# Patient Record
Sex: Female | Born: 1945 | Race: White | Hispanic: No | Smoking: Never smoker
Health system: Southern US, Community
[De-identification: ages and names within clinical notes are randomized; demographics above are authoritative.]

## PROBLEM LIST (undated history)

## (undated) DIAGNOSIS — T7840XA Allergy, unspecified, initial encounter: Secondary | ICD-10-CM

## (undated) DIAGNOSIS — Z3400 Encounter for supervision of normal first pregnancy, unspecified trimester: Secondary | ICD-10-CM

## (undated) DIAGNOSIS — A809 Acute poliomyelitis, unspecified: Secondary | ICD-10-CM

## (undated) DIAGNOSIS — L719 Rosacea, unspecified: Secondary | ICD-10-CM

## (undated) DIAGNOSIS — I10 Essential (primary) hypertension: Secondary | ICD-10-CM

## (undated) DIAGNOSIS — M545 Low back pain, unspecified: Secondary | ICD-10-CM

## (undated) DIAGNOSIS — E785 Hyperlipidemia, unspecified: Secondary | ICD-10-CM

## (undated) DIAGNOSIS — M8580 Other specified disorders of bone density and structure, unspecified site: Secondary | ICD-10-CM

## (undated) DIAGNOSIS — K219 Gastro-esophageal reflux disease without esophagitis: Secondary | ICD-10-CM

## (undated) DIAGNOSIS — R51 Headache: Secondary | ICD-10-CM

## (undated) HISTORY — PX: OTHER SURGICAL HISTORY: SHX169

## (undated) HISTORY — PX: OOPHORECTOMY: SHX86

## (undated) HISTORY — DX: Essential (primary) hypertension: I10

## (undated) HISTORY — DX: Acute poliomyelitis, unspecified: A80.9

## (undated) HISTORY — DX: Headache: R51

## (undated) HISTORY — DX: Low back pain: M54.5

## (undated) HISTORY — DX: Hyperlipidemia, unspecified: E78.5

## (undated) HISTORY — DX: Gastro-esophageal reflux disease without esophagitis: K21.9

## (undated) HISTORY — PX: COLONOSCOPY: SHX174

## (undated) HISTORY — DX: Encounter for supervision of normal first pregnancy, unspecified trimester: Z34.00

## (undated) HISTORY — DX: Rosacea, unspecified: L71.9

## (undated) HISTORY — PX: CATARACT EXTRACTION: SUR2

## (undated) HISTORY — DX: Other specified disorders of bone density and structure, unspecified site: M85.80

## (undated) HISTORY — DX: Allergy, unspecified, initial encounter: T78.40XA

## (undated) HISTORY — DX: Low back pain, unspecified: M54.50

## (undated) HISTORY — PX: EYE SURGERY: SHX253

---
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
## 2001-10-18 ENCOUNTER — Encounter: Payer: Self-pay | Admitting: Emergency Medicine

## 2001-10-18 ENCOUNTER — Emergency Department (HOSPITAL_COMMUNITY): Admission: AC | Admit: 2001-10-18 | Discharge: 2001-10-18 | Payer: Self-pay

## 2002-02-01 ENCOUNTER — Encounter: Admission: RE | Admit: 2002-02-01 | Discharge: 2002-02-01 | Payer: Self-pay | Admitting: Otolaryngology

## 2002-02-01 ENCOUNTER — Encounter: Payer: Self-pay | Admitting: Otolaryngology

## 2002-05-11 ENCOUNTER — Other Ambulatory Visit: Admission: RE | Admit: 2002-05-11 | Discharge: 2002-05-11 | Payer: Self-pay | Admitting: Obstetrics and Gynecology

## 2002-06-28 ENCOUNTER — Encounter (INDEPENDENT_AMBULATORY_CARE_PROVIDER_SITE_OTHER): Payer: Self-pay

## 2002-06-28 ENCOUNTER — Ambulatory Visit (HOSPITAL_COMMUNITY): Admission: RE | Admit: 2002-06-28 | Discharge: 2002-06-28 | Payer: Self-pay | Admitting: Obstetrics and Gynecology

## 2002-06-28 ENCOUNTER — Encounter (INDEPENDENT_AMBULATORY_CARE_PROVIDER_SITE_OTHER): Payer: Self-pay | Admitting: Specialist

## 2003-02-18 LAB — HM COLONOSCOPY

## 2003-11-03 ENCOUNTER — Other Ambulatory Visit: Admission: RE | Admit: 2003-11-03 | Discharge: 2003-11-03 | Payer: Self-pay | Admitting: Obstetrics and Gynecology

## 2004-01-20 ENCOUNTER — Encounter: Payer: Self-pay | Admitting: Internal Medicine

## 2004-01-22 ENCOUNTER — Ambulatory Visit: Payer: Self-pay | Admitting: Internal Medicine

## 2004-01-30 ENCOUNTER — Ambulatory Visit: Payer: Self-pay | Admitting: Internal Medicine

## 2004-05-06 ENCOUNTER — Ambulatory Visit: Payer: Self-pay | Admitting: Internal Medicine

## 2004-08-31 ENCOUNTER — Emergency Department (HOSPITAL_COMMUNITY): Admission: EM | Admit: 2004-08-31 | Discharge: 2004-08-31 | Payer: Self-pay | Admitting: Emergency Medicine

## 2004-12-05 ENCOUNTER — Encounter: Payer: Self-pay | Admitting: Internal Medicine

## 2004-12-05 ENCOUNTER — Encounter: Admission: RE | Admit: 2004-12-05 | Discharge: 2004-12-05 | Payer: Self-pay | Admitting: Orthopedic Surgery

## 2005-01-01 ENCOUNTER — Ambulatory Visit: Payer: Self-pay | Admitting: Internal Medicine

## 2005-03-04 ENCOUNTER — Ambulatory Visit: Payer: Self-pay | Admitting: Internal Medicine

## 2005-04-23 ENCOUNTER — Ambulatory Visit: Payer: Self-pay | Admitting: Internal Medicine

## 2005-04-30 ENCOUNTER — Encounter: Payer: Self-pay | Admitting: Internal Medicine

## 2005-04-30 ENCOUNTER — Ambulatory Visit: Payer: Self-pay | Admitting: Internal Medicine

## 2005-04-30 ENCOUNTER — Other Ambulatory Visit: Admission: RE | Admit: 2005-04-30 | Discharge: 2005-04-30 | Payer: Self-pay | Admitting: Internal Medicine

## 2005-12-17 ENCOUNTER — Ambulatory Visit: Payer: Self-pay | Admitting: Internal Medicine

## 2006-01-21 ENCOUNTER — Encounter: Admission: RE | Admit: 2006-01-21 | Discharge: 2006-01-21 | Payer: Self-pay | Admitting: Internal Medicine

## 2006-05-29 ENCOUNTER — Ambulatory Visit: Payer: Self-pay | Admitting: Internal Medicine

## 2006-06-17 ENCOUNTER — Ambulatory Visit: Payer: Self-pay | Admitting: Internal Medicine

## 2006-06-17 LAB — CONVERTED CEMR LAB
Bilirubin, Direct: 0.1 mg/dL (ref 0.0–0.3)
Direct LDL: 171 mg/dL
Eosinophils Absolute: 0.2 10*3/uL (ref 0.0–0.6)
Eosinophils Relative: 4.2 % (ref 0.0–5.0)
GFR calc Af Amer: 94 mL/min
GFR calc non Af Amer: 78 mL/min
Glucose, Bld: 94 mg/dL (ref 70–99)
HCT: 44.6 % (ref 36.0–46.0)
HDL: 47.4 mg/dL (ref >39.0)
Lymphocytes Relative: 27.3 % (ref 12.0–46.0)
MCV: 84.6 fL (ref 78.0–100.0)
Monocytes Absolute: 0.4 10*3/uL (ref 0.2–0.7)
Neutro Abs: 3 10*3/uL (ref 1.4–7.7)
Neutrophils Relative %: 61.3 % (ref 43.0–77.0)
Platelets: 197 10*3/uL (ref 150–400)
Sodium: 142 meq/L (ref 135–145)
WBC: 5 10*3/uL (ref 4.5–10.5)

## 2006-06-29 ENCOUNTER — Other Ambulatory Visit: Admission: RE | Admit: 2006-06-29 | Discharge: 2006-06-29 | Payer: Self-pay | Admitting: Internal Medicine

## 2006-06-29 ENCOUNTER — Encounter: Payer: Self-pay | Admitting: Internal Medicine

## 2006-06-29 ENCOUNTER — Ambulatory Visit: Payer: Self-pay | Admitting: Internal Medicine

## 2006-07-16 ENCOUNTER — Ambulatory Visit: Payer: Self-pay | Admitting: Internal Medicine

## 2006-07-16 LAB — CONVERTED CEMR LAB
AST: 14 units/L (ref 0–37)
Cholesterol: 156 mg/dL (ref 0–200)
HDL: 43.1 mg/dL (ref >39.0)
VLDL: 17 mg/dL (ref 0–40)
Vit D, 1,25-Dihydroxy: 41 (ref 20–57)

## 2006-07-30 ENCOUNTER — Ambulatory Visit: Payer: Self-pay | Admitting: Internal Medicine

## 2006-11-27 ENCOUNTER — Ambulatory Visit: Payer: Self-pay | Admitting: Internal Medicine

## 2007-03-02 ENCOUNTER — Telehealth: Payer: Self-pay | Admitting: Internal Medicine

## 2007-04-06 ENCOUNTER — Telehealth: Payer: Self-pay | Admitting: Internal Medicine

## 2007-07-05 ENCOUNTER — Telehealth: Payer: Self-pay | Admitting: Internal Medicine

## 2007-07-08 ENCOUNTER — Encounter: Payer: Self-pay | Admitting: Internal Medicine

## 2007-08-25 ENCOUNTER — Ambulatory Visit: Payer: Self-pay | Admitting: Internal Medicine

## 2007-08-25 LAB — CONVERTED CEMR LAB
Albumin: 3.5 g/dL (ref 3.5–5.2)
BUN: 11 mg/dL (ref 6–23)
Basophils Relative: 0.1 % (ref 0.0–1.0)
Blood in Urine, dipstick: NEGATIVE
Calcium: 8.5 mg/dL (ref 8.4–10.5)
Creatinine, Ser: 0.8 mg/dL (ref 0.4–1.2)
Eosinophils Absolute: 0.5 10*3/uL (ref 0.0–0.7)
Eosinophils Relative: 12.2 % — ABNORMAL HIGH (ref 0.0–5.0)
GFR calc Af Amer: 93 mL/min
GFR calc non Af Amer: 77 mL/min
Glucose, Bld: 93 mg/dL (ref 70–99)
Glucose, Urine, Semiquant: NEGATIVE
HCT: 41.4 % (ref 36.0–46.0)
HDL: 35.9 mg/dL — ABNORMAL LOW (ref >39.0)
Hemoglobin: 14.4 g/dL (ref 12.0–15.0)
Ketones, urine, test strip: NEGATIVE
MCV: 87.1 fL (ref 78.0–100.0)
Monocytes Absolute: 0.3 10*3/uL (ref 0.1–1.0)
Monocytes Relative: 8.6 % (ref 3.0–12.0)
Neutro Abs: 1.6 10*3/uL (ref 1.4–7.7)
Platelets: 179 10*3/uL (ref 150–400)
Potassium: 3.7 meq/L (ref 3.5–5.1)
TSH: 1.98 microintl units/mL (ref 0.35–5.50)
Total Protein: 5.8 g/dL — ABNORMAL LOW (ref 6.0–8.3)
WBC Urine, dipstick: NEGATIVE
WBC: 3.7 10*3/uL — ABNORMAL LOW (ref 4.5–10.5)
pH: 7.5

## 2007-09-01 ENCOUNTER — Encounter: Payer: Self-pay | Admitting: Internal Medicine

## 2007-09-01 ENCOUNTER — Ambulatory Visit: Payer: Self-pay | Admitting: Internal Medicine

## 2007-09-01 ENCOUNTER — Other Ambulatory Visit: Admission: RE | Admit: 2007-09-01 | Discharge: 2007-09-01 | Payer: Self-pay | Admitting: Internal Medicine

## 2007-09-01 DIAGNOSIS — R519 Headache, unspecified: Secondary | ICD-10-CM | POA: Insufficient documentation

## 2007-09-01 DIAGNOSIS — M899 Disorder of bone, unspecified: Secondary | ICD-10-CM | POA: Insufficient documentation

## 2007-09-01 DIAGNOSIS — R030 Elevated blood-pressure reading, without diagnosis of hypertension: Secondary | ICD-10-CM | POA: Insufficient documentation

## 2007-09-01 DIAGNOSIS — K219 Gastro-esophageal reflux disease without esophagitis: Secondary | ICD-10-CM | POA: Insufficient documentation

## 2007-09-01 DIAGNOSIS — E785 Hyperlipidemia, unspecified: Secondary | ICD-10-CM | POA: Insufficient documentation

## 2007-09-01 DIAGNOSIS — M949 Disorder of cartilage, unspecified: Secondary | ICD-10-CM

## 2007-09-01 DIAGNOSIS — J309 Allergic rhinitis, unspecified: Secondary | ICD-10-CM

## 2007-09-01 DIAGNOSIS — R51 Headache: Secondary | ICD-10-CM

## 2007-09-01 DIAGNOSIS — M545 Low back pain: Secondary | ICD-10-CM

## 2007-10-11 ENCOUNTER — Telehealth: Payer: Self-pay | Admitting: *Deleted

## 2007-11-01 ENCOUNTER — Ambulatory Visit: Payer: Self-pay | Admitting: Internal Medicine

## 2007-11-22 ENCOUNTER — Telehealth: Payer: Self-pay | Admitting: *Deleted

## 2007-12-01 ENCOUNTER — Ambulatory Visit: Payer: Self-pay | Admitting: Internal Medicine

## 2008-04-10 ENCOUNTER — Telehealth: Payer: Self-pay | Admitting: Internal Medicine

## 2008-05-05 ENCOUNTER — Ambulatory Visit: Payer: Self-pay | Admitting: Internal Medicine

## 2008-05-10 DIAGNOSIS — Z8612 Personal history of poliomyelitis: Secondary | ICD-10-CM

## 2008-05-16 ENCOUNTER — Encounter: Payer: Self-pay | Admitting: Internal Medicine

## 2008-05-16 ENCOUNTER — Encounter: Admission: RE | Admit: 2008-05-16 | Discharge: 2008-05-16 | Payer: Self-pay | Admitting: Internal Medicine

## 2008-06-06 ENCOUNTER — Ambulatory Visit: Payer: Self-pay | Admitting: Internal Medicine

## 2008-06-06 DIAGNOSIS — I10 Essential (primary) hypertension: Secondary | ICD-10-CM

## 2008-06-07 ENCOUNTER — Telehealth: Payer: Self-pay | Admitting: Internal Medicine

## 2008-08-22 ENCOUNTER — Ambulatory Visit: Payer: Self-pay | Admitting: Family Medicine

## 2008-08-22 DIAGNOSIS — J320 Chronic maxillary sinusitis: Secondary | ICD-10-CM | POA: Insufficient documentation

## 2008-11-01 ENCOUNTER — Encounter: Payer: Self-pay | Admitting: Internal Medicine

## 2008-11-22 ENCOUNTER — Ambulatory Visit: Payer: Self-pay | Admitting: Internal Medicine

## 2008-11-22 LAB — CONVERTED CEMR LAB
ALT: 19 units/L (ref 0–35)
Albumin: 4.1 g/dL (ref 3.5–5.2)
BUN: 18 mg/dL (ref 6–23)
Basophils Relative: 0.3 % (ref 0.0–3.0)
Bilirubin Urine: NEGATIVE
CO2: 31 meq/L (ref 19–32)
Chloride: 103 meq/L (ref 96–112)
Cholesterol: 122 mg/dL (ref 0–200)
Creatinine, Ser: 0.8 mg/dL (ref 0.4–1.2)
Eosinophils Absolute: 0.1 10*3/uL (ref 0.0–0.7)
Eosinophils Relative: 2.7 % (ref 0.0–5.0)
Glucose, Urine, Semiquant: NEGATIVE
HCT: 44.6 % (ref 36.0–46.0)
Ketones, urine, test strip: NEGATIVE
LDL Cholesterol: 69 mg/dL (ref 0–99)
Lymphs Abs: 1.4 10*3/uL (ref 0.7–4.0)
MCHC: 33.6 g/dL (ref 30.0–36.0)
MCV: 86.6 fL (ref 78.0–100.0)
Monocytes Absolute: 0.3 10*3/uL (ref 0.1–1.0)
Potassium: 4.2 meq/L (ref 3.5–5.1)
RBC: 5.15 M/uL — ABNORMAL HIGH (ref 3.87–5.11)
Specific Gravity, Urine: 1.015
TSH: 1.47 microintl units/mL (ref 0.35–5.50)
Total CHOL/HDL Ratio: 3
Total Protein: 6.4 g/dL (ref 6.0–8.3)
Triglycerides: 57 mg/dL (ref 0.0–149.0)
Urobilinogen, UA: 1
WBC: 4.1 10*3/uL — ABNORMAL LOW (ref 4.5–10.5)

## 2008-11-29 ENCOUNTER — Ambulatory Visit: Payer: Self-pay | Admitting: Internal Medicine

## 2008-11-29 ENCOUNTER — Encounter: Payer: Self-pay | Admitting: Internal Medicine

## 2008-11-29 ENCOUNTER — Other Ambulatory Visit: Admission: RE | Admit: 2008-11-29 | Discharge: 2008-11-29 | Payer: Self-pay | Admitting: Internal Medicine

## 2008-12-05 ENCOUNTER — Encounter: Payer: Self-pay | Admitting: Internal Medicine

## 2009-07-03 ENCOUNTER — Telehealth (INDEPENDENT_AMBULATORY_CARE_PROVIDER_SITE_OTHER): Payer: Self-pay | Admitting: *Deleted

## 2009-07-05 ENCOUNTER — Telehealth: Payer: Self-pay | Admitting: *Deleted

## 2009-07-19 ENCOUNTER — Telehealth: Payer: Self-pay | Admitting: *Deleted

## 2009-10-18 ENCOUNTER — Telehealth: Payer: Self-pay | Admitting: *Deleted

## 2009-11-06 LAB — HM MAMMOGRAPHY

## 2009-11-13 ENCOUNTER — Encounter: Payer: Self-pay | Admitting: Internal Medicine

## 2009-11-23 ENCOUNTER — Ambulatory Visit: Payer: Self-pay | Admitting: Internal Medicine

## 2009-11-23 LAB — CONVERTED CEMR LAB
ALT: 19 units/L (ref 0–35)
AST: 18 units/L (ref 0–37)
Albumin: 4.2 g/dL (ref 3.5–5.2)
Basophils Absolute: 0 10*3/uL (ref 0.0–0.1)
Bilirubin Urine: NEGATIVE
CO2: 31 meq/L (ref 19–32)
Chloride: 104 meq/L (ref 96–112)
GFR calc non Af Amer: 78.74 mL/min (ref >60)
Glucose, Bld: 96 mg/dL (ref 70–99)
Glucose, Urine, Semiquant: NEGATIVE
HCT: 44.5 % (ref 36.0–46.0)
Hemoglobin: 15.3 g/dL — ABNORMAL HIGH (ref 12.0–15.0)
Lymphs Abs: 1.6 10*3/uL (ref 0.7–4.0)
MCV: 87.8 fL (ref 78.0–100.0)
Monocytes Absolute: 0.3 10*3/uL (ref 0.1–1.0)
Monocytes Relative: 6.6 % (ref 3.0–12.0)
Neutro Abs: 2.7 10*3/uL (ref 1.4–7.7)
Potassium: 5 meq/L (ref 3.5–5.1)
RDW: 13 % (ref 11.5–14.6)
Sodium: 141 meq/L (ref 135–145)
Specific Gravity, Urine: 1.015
TSH: 2.08 microintl units/mL (ref 0.35–5.50)
VLDL: 15 mg/dL (ref 0.0–40.0)
WBC Urine, dipstick: NEGATIVE
pH: 7.5

## 2009-12-04 ENCOUNTER — Ambulatory Visit: Payer: Self-pay | Admitting: Internal Medicine

## 2009-12-04 ENCOUNTER — Other Ambulatory Visit
Admission: RE | Admit: 2009-12-04 | Discharge: 2009-12-04 | Payer: Self-pay | Source: Home / Self Care | Admitting: Internal Medicine

## 2009-12-04 DIAGNOSIS — H6690 Otitis media, unspecified, unspecified ear: Secondary | ICD-10-CM | POA: Insufficient documentation

## 2009-12-04 LAB — HM PAP SMEAR

## 2009-12-05 ENCOUNTER — Encounter: Payer: Self-pay | Admitting: Internal Medicine

## 2009-12-17 ENCOUNTER — Telehealth: Payer: Self-pay | Admitting: *Deleted

## 2010-01-07 ENCOUNTER — Telehealth: Payer: Self-pay | Admitting: *Deleted

## 2010-03-19 NOTE — Progress Notes (Signed)
Summary: REFILL REQUEST  Phone Note Refill Request Call back at [phone] Message from:  Patient on July 19, 2009 12:12 PM  Refills Requested: Medication #1:  ZOCOR 20 MG  TABS 1 by mouth once daily   Notes: Pt req that Rx be sent to [facility]... but req that a Rx for at least 30-days be sent [facility] [place], [place]  [phone] (pt is out of med and [facility] Rx will take a while before it is delivered) .Marland Kitchen..see notation below.  Pt adv that [facility] is advising that they HAVE NOT received  anything for Rx: ZOCOR 20 MG  TABS (SIMVASTATIN) ...Marland KitchenMarland KitchenPt req that Rx be sent to [facility]... For the time being, pt req that a Rx for at least 30-days be sent to [facility] [place], [place]  [phone] (pt is out of med and [facility] Rx will take a while before it is delivered) ....Marland KitchenMarland KitchenPt can be reached at [phone] with any questions or concerns.   Initial call taken by: Debbra Riding,  July 19, 2009 12:16 PM  Follow-up for Phone Call        Pt aware and will call [facility] to see what is going on. I also [facility] [place] for her. Pt is aware of this. Follow-up by: Romualdo Bolk, CMA (AAMA),  July 19, 2009 12:27 PM    Prescriptions: ZOCOR 20 MG  TABS (SIMVASTATIN) 1 by mouth once daily  #30 x 0   Entered by:   Romualdo Bolk, CMA (AAMA)   Authorized by:   Madelin Headings MD   Signed by:   Romualdo Bolk, CMA (AAMA) on 07/19/2009   Method used:   [facility]ron[facility]        [facility] [address]  [phone]* (retail)       [address]       [place], [place]  [address]       Ph: [phone] or [phone]       Fax: [phone]   RxID:   [id number]   Appended Document: REFILL REQUEST Pt called back stating that [facility] told her that [facility] doesn't do cover sheets. If they cover the rx after cover sheet is disreguard and falls off. So their fax only get's one paper. Rx re-sent to [facility] without a cover sheet. Romualdo Bolk, CMA (AAMA)  July 20, 2009 8:54  AM

## 2010-03-19 NOTE — Assessment & Plan Note (Signed)
Summary: CPX/PAP/CJR   Vital Signs:  Patient profile:   65 year old female Menstrual status:  postmenopausal Height:      65 inches Weight:      164 pounds BMI:     27.39 Temp:     98.6 degrees F oral Pulse rate:   82 / minute BP sitting:   144 / 80  (left arm)  Vitals Entered By: Doristine Devoid CMA (December 04, 2009 1:50 PM)  Nutrition Counseling: Patient's BMI is greater than 25 and therefore counseled on weight management options. CC: CPX AND PAP   History of Present Illness: Cheryl Robbins comes in today  for  preventive visit .  She is now on medicare  and this is her welcome to medicare check up.  Since last visit  here  there have been no major changes in health status  . However she did have : -Fracture  little toe month ago  -.left muscle spasm  with twisting.  lower  thorax and then upper abd .No cp sob. -decrease hearing lright ear  after descent plane  flight . NO pain has had  congestion before that no fever or face pain but gets "sinus problems" Allergies ok on  meds  Here for Medicare AWV:  1.   Risk factors based on Past M, S, F history: 2.   Physical Activities:  active walking   3.   Depression/mood:  no depression 4.   Hearing:  good except today  on right ( see above) 5.   ADL's:  totally independent  cares for grand kids  6.   Fall Risk:  none  7.   Home Safety:  reviewed  8.   Height, weight, &visual acuity: vision per eye doc with glasses  9.   Counseling:  10.   Labs ordered based on risk factors:  11.           Referral Coordination 12.           Care Plan 13.            Cognitive Assessment  Pt is A&Ox3,affect,speech,memory,attention,&motor skills appear intact.   Preventive Screening-Counseling & Management  Alcohol-Tobacco     Alcohol drinks/day: 0     Smoking Status: never  Caffeine-Diet-Exercise     Caffeine use/day: 4     Does Patient Exercise: yes     Type of exercise: walking     Exercise (avg: min/session): 30-60     Times/week:  7     MSH Depression Score: no  Hep-HIV-STD-Contraception     Dental Visit-last 6 months yes     Sun Exposure-Excessive: no  Safety-Violence-Falls     Seat Belt Use: yes     Firearms in the Home: no firearms in the home     Smoke Detectors: yes     Fall Risk: no fall   Current Medications (verified): 1)  Clarinex 5 Mg Tabs (Desloratadine) .Marland Kitchen.. 1 Qd 2)  Fluticasone Propionate 50 Mcg/act Susp (Fluticasone Propionate) .... 2 Sprays Each Nostril Qd 3)  Zocor 20 Mg  Tabs (Simvastatin) .Marland Kitchen.. 1 By Mouth Once Daily 4)  Co Q-10 150 Mg Caps (Coenzyme Q10) .... Take Tablet Daily 5)  Vitamin D 1000 Unit  Tabs (Cholecalciferol) .... Take One Tablet Daily 6)  Ocuvite Adult 50+   Caps (Multiple Vitamins-Minerals) .... Take One Tablet Daily 7)  Mucinex Maximum Strength 1200 Mg  Xr12h-Tab (Guaifenesin) .Marland Kitchen.. 1 Bid 8)  Centrum Silver  Tabs (Multiple Vitamins-Minerals) .Marland KitchenMarland KitchenMarland Kitchen  Take One Tablet Daily 9)  Patanol 0.1 % Soln (Olopatadine Hcl) .... Use Daily As Directed 10)  Promethazine Hcl 12.5 Mg Supp (Promethazine Hcl) .... Insert 1 Suppository Rectally Every 4 Hours As Needed  Allergies (verified): 1)  ! Penicillin 2)  ! Sulfa  Past History:  Past medical, surgical, family and social histories (including risk factors) reviewed, and no changes noted (except as noted below).  Past Medical History: Reviewed history from 11/29/2008 and no changes required. 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   Atypical moles  Family History: Reviewed history from 06/06/2008 and no changes required. MOM with abnormal blood cells  to see Dr  Twanna Hy.  hx dvt on coumadin atrial fib.  Father cabg   ALS died in 19 s  sister thyroid cancer  Bro fibromyalgia  aunt and uncle had strokes and MIs   Social History: Reviewed history from 11/29/2008 and no changes required. hh of 2  no pets  no ets.   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 descending and now cant hear well.  4 days ago.   some better with hearing. has some  congestion.   took bufferin  and  help.  Asks for refill of sulfa topical for blemishes as needed given by derm in the past.  rest of ros neg  or [place]  Physical Exam  General:  Well-developed,well-nourished,in no acute distress; alert,appropriate and cooperative throughout examination Head:  normocephalic and atraumatic.   Eyes:  PERRL, EOMs full, conjunctiva clear  Ears:  L ear normal and no external deformities.   right tm some redness and fluid level   eac nl  Nose:  no external deformity.  mild congestion face non tender Mouth:  pharynx pink and moist.   Neck:  No deformities, masses, or tenderness noted. Chest Wall:  No deformities, masses, or tenderness noted. Breasts:  No mass, nodules, thickening, tenderness, bulging, retraction, inflamation, nipple discharge or skin changes noted.   Lungs:  Normal respiratory effort, chest expands symmetrically. Lungs are clear to auscultation, no crackles or wheezes. Heart:  Normal rate and regular rhythm. S1 and S2 normal without gallop, murmur, click, rub or other extra sounds.no JVD.   Abdomen:  Bowel sounds positive,abdomen soft and non-tender without masses, organomegaly or hernias noted. Rectal:  No external abnormalities noted. Normal sphincter tone. No rectal masses or tenderness. Genitalia:  Pelvic Exam:        External: normal female genitalia without lesions or masses        Vagina: normal  without lesions or masses        Cervix: normal without lesions or masses        Adnexa: normal bimanual exam without masses or fullness        Uterus: normal by palpation        Pap smear: performed Msk:  no joint swelling, no joint warmth, and no redness over joints.    slight tenderness pinky toe  right no deformity Pulses:  pulses intact without delay   Extremities:  no clubbing cyanosis or edema  Neurologic:  alert & oriented X3, strength normal in all extremities, and gait normal.   Pt is A&Ox3,affect,speech,memory,attention,&motor skills appear intact.  Skin:  turgor normal, color normal, no ecchymoses, and no petechiae.   Cervical Nodes:  No lymphadenopathy noted Axillary Nodes:  No palpable lymphadenopathy Inguinal Nodes:  No significant adenopathy Psych:  Normal eye contact, appropriate affect. Cognition appears normal.    Impression & Recommendations:  Problem # 1:  Preventive Health Care (ICD-V70.0)  Discussed nutrition,exercise,diet,healthy weight, vitamin D and calcium.    UTD on all parameteres pneumovax today   Orders: Welcome to [facility], Physical [phone])  Problem # 2:  ROUTINE GYNECOLOGICAL EXAM (ICD-V72.31)  pap done   Orders: Welcome to [facility], Physical [phone]) Obtaining Screening PAP Smear (L3810)  Problem # 3:  POLIOMYELITIS, HX OF (ICD-V12.02) Assessment: Comment Only  Problem # 4:  UNSPECIFIED OTITIS MEDIA (ICD-382.9)  right  after plain descent   not assoicated with dizziness and total hearing loss  Her updated medication list for this problem includes:    Cefdinir 300 Mg Caps (Cefdinir) .Marland Kitchen... 1 by mouth two times a day for ear infection  Orders: Prescription Created Electronically [phone])  Problem # 5:  OSTEOPENIA (ICD-733.90)  Her updated medication list for this problem includes:    Vitamin D 1000 Unit Tabs (Cholecalciferol) .Marland Kitchen... Take one tablet daily  Problem # 6:  HYPERLIPIDEMIA (ICD-272.4) no se of meds  Her updated medication list  for this problem includes:    Zocor 20 Mg Tabs (Simvastatin) .Marland Kitchen... 1 by mouth once daily  Labs Reviewed: SGOT: 18 (11/23/2009)   SGPT: 19 (11/23/2009)   HDL:44.20 (11/23/2009), 42.10 (11/22/2008)  LDL:61 (11/23/2009), 69 (11/22/2008)  Chol:120 (11/23/2009), 122 (11/22/2008)  Trig:75.0 (11/23/2009), 57.0 (11/22/2008)  Problem # 7:  ALLERGIC RHINITIS (ICD-477.9)  Her updated medication list for this problem includes:    Clarinex 5 Mg Tabs (Desloratadine) .Marland Kitchen... 1 qd    Fluticasone Propionate 50 Mcg/act Susp (Fluticasone propionate) .Marland Kitchen... 2 sprays each nostril qd    Promethazine Hcl 12.5 Mg Supp (Promethazine hcl) ..... Insert 1 suppository rectally every 4 hours as needed  Problem # 8:  ELEVATED BLOOD PRESSURE WITHOUT DIAGNOSIS OF HYPERTENSION (ICD-796.2) monitor     Complete Medication List: 1)  Clarinex 5 Mg Tabs (Desloratadine) .Marland Kitchen.. 1 qd 2)  Fluticasone Propionate 50 Mcg/act Susp (Fluticasone propionate) .... 2 sprays each nostril qd 3)  Zocor 20 Mg Tabs (Simvastatin) .Marland Kitchen.. 1 by mouth once daily 4)  Co Q-10 150 Mg Caps (Coenzyme q10) .... Take tablet daily 5)  Vitamin D 1000 Unit Tabs (Cholecalciferol) .... Take one tablet daily 6)  Ocuvite Adult 50+ Caps (Multiple vitamins-minerals) .... Take one tablet daily 7)  Mucinex Maximum Strength 1200 Mg Xr12h-tab (Guaifenesin) .Marland Kitchen.. 1 bid 8)  Centrum Silver Tabs (Multiple vitamins-minerals) .... Take one tablet daily 9)  Patanol 0.1 % Soln (Olopatadine hcl) .... Use daily as directed 10)  Promethazine Hcl 12.5 Mg Supp (Promethazine hcl) .... Insert 1 suppository rectally every 4 hours as needed 11)  Sulfacetamide Sodium (acne) 10 % Lotn (Sulfacetamide sodium (acne)) .... Apply to affected area of face two times a dayas  needed 12)  Cefdinir 300 Mg Caps (Cefdinir) .Marland Kitchen.. 1 by mouth two times a day for ear infection  Other Orders: Pneumococcal Vaccine (25852) Admin 1st Vaccine (77824) EKG w/ Interpretation (93000)  Patient Instructions: 1)   take antibiotic for  ear infection from air travel. 2)  If   hearing is not better in 3 weeks or so call  for follow up or ear referral. 3)  You will be informed of lab/PAPresults when available.  Prescriptions: CEFDINIR 300 MG CAPS (CEFDINIR) 1 by mouth two times a day for ear infection  #14 x 0   Entered and Authorized by:   Madelin Headings MD   Signed by:   Madelin Headings MD on 12/04/2009   Method used:   Electronically to        [facility]. [phone]* (retail)       [phone]       [place], [place]  [address]       Ph: [phone]       Fax: [phone]   RxID:   [id number] SULFACETAMIDE SODIUM (ACNE) 10 % LOTN (SULFACETAMIDE SODIUM (ACNE)) apply to affected area of face two times a dayas  needed  #4 oz x 1   Entered and Authorized by:   Madelin Headings MD   Signed by:   Madelin Headings MD on 12/04/2009   Method used:   Print then Give to Patient   RxID:   [id number]    Orders Added: 1)  Pneumococcal Vaccine [90732] 2)  Admin 1st Vaccine [90471] 3)  EKG w/ Interpretation [93000] 4)  Welcome to Medicare, Physical [G0402] 5)  Obtaining Screening PAP Smear [Q0091] 6)  Est. Patient Level III [84166] 7)  Prescription Created Electronically [phone]   Immunizations Administered:  Pneumonia Vaccine:    Vaccine Type: Pneumovax    Site: left deltoid    Mfr: Merck    Dose: 0.5 ml    Route: IM    Given by: Doristine Devoid CMA    Exp. Date: 06/13/2011    Lot #: [id number]   Immunizations Administered:  Pneumonia Vaccine:    Vaccine Type: Pneumovax    Site: left deltoid    Mfr: Merck    Dose: 0.5 ml    Route: IM    Given by: Doristine Devoid CMA    Exp. Date: 06/13/2011    Lot #: [id number]   Preventive Care Screening  Mammogram:    Date:  11/06/2009    Results:  normal   Prior Values:    Pap Smear:  NEGATIVE FOR INTRAEPITHELIAL LESIONS OR MALIGNANCY. (11/29/2008)    Mammogram:  normal (11/01/2008)    Colonoscopy:  normal  (02/18/2003)    Last Tetanus Booster:  Tdap (07/30/2006)    Last Flu Shot:  Fluvax 3+ (11/29/2008)  Preventive Care Screening  Mammogram:    Date:  11/06/2009    Results:  normal   Prior Values:    Pap Smear:  NEGATIVE FOR INTRAEPITHELIAL LESIONS OR MALIGNANCY. (11/29/2008)    Mammogram:  normal (11/01/2008)    Colonoscopy:  normal (02/18/2003)    Last Tetanus Booster:  Tdap (07/30/2006)    Last Flu Shot:  Fluvax 3+ (11/29/2008)

## 2010-03-19 NOTE — Progress Notes (Signed)
Summary: sulfacetamide sodium is unavailable  Phone Note From Pharmacy   Caller: [facility]. #[id number]* Summary of Call: Pharmacy called saying that Sulfacetamide Sodium 10% lotion is unavailable by manufacturer. What would like to give her as a sub.? Initial call taken by: Romualdo Bolk, CMA (AAMA),  December 17, 2009 11:06 AM  Follow-up for Phone Call        There is no equivalent .    Can use  rosacea med such as finacea  apply q d or two times a day to face as needed. But dont know the cost  . refill x 2   Follow-up by: Madelin Headings MD,  December 20, 2009 11:51 AM  Additional Follow-up for Phone Call Additional follow up Details #1::        Spoke with pt and she states that her husband has already picked up a rx. She is going to let us know if she needs [place] to call in this rx. Additional Follow-up by: Romualdo Bolk, CMA (AAMA),  December 20, 2009 12:52 PM

## 2010-03-19 NOTE — Progress Notes (Signed)
Summary: Sonda Primes  Phone Note Call from Patient Call back at Home Phone [phone]   Caller: Patient Call For: Madelin Headings MD Summary of Call: pt needs simvastin 20MG  #90 with 3 month REFILLS fax to [facility] pt ID# [id number] Initial call taken by: Heron Sabins,  October 18, 2009 10:54 AM  Follow-up for Phone Call        rx faxed to pharmacy Follow-up by: Romualdo Bolk, CMA (AAMA),  October 18, 2009 11:22 AM    Prescriptions: ZOCOR 20 MG  TABS (SIMVASTATIN) 1 by mouth once daily  #90 x 0   Entered by:   Romualdo Bolk, CMA (AAMA)   Authorized by:   Madelin Headings MD   Signed by:   Romualdo Bolk, CMA (AAMA) on 10/18/2009   Method used:   Faxed to ...       [facility]       [address] [place], [place]  [address]       Ph: [phone]       Fax: [phone]   RxID:   [id number]

## 2010-03-19 NOTE — Progress Notes (Signed)
Summary: REFILL REQUEST  Phone Note Refill Request Message from:  Patient on January 07, 2010 9:44 AM  Refills Requested: Medication #1:  ZOCOR 20 MG  TABS 1 by mouth once daily   Notes: [facility].    Initial call taken by: Debbra Riding,  January 07, 2010 9:45 AM  Follow-up for Phone Call        Rx faxed to pharmacy Follow-up by: Romualdo Bolk, CMA Duncan Dull),  January 07, 2010 10:24 AM    Prescriptions: ZOCOR 20 MG  TABS (SIMVASTATIN) 1 by mouth once daily  #90 x 3   Entered by:   Romualdo Bolk, CMA (AAMA)   Authorized by:   Madelin Headings MD   Signed by:   Romualdo Bolk, CMA (AAMA) on 01/07/2010   Method used:   Faxed to ...       [facility]       [address] [place], [place]  [address]       Ph: [phone]       Fax: [phone]   RxID:   [id number]

## 2010-03-19 NOTE — Progress Notes (Signed)
Summary: fu on rx to [facility]  Phone Note Call from Patient Call back at Home Phone [phone]   Caller: Patient Call For: Madelin Headings MD Summary of Call: pt call back [facility] has not received rx simvastin Initial call taken by: Heron Sabins,  Jul 05, 2009 2:19 PM  Follow-up for Phone Call        See previous phone note. Follow-up by: Romualdo Bolk, CMA (AAMA),  Jul 06, 2009 8:51 AM

## 2010-03-19 NOTE — Progress Notes (Signed)
Summary: refill on simvastatin  Phone Note From Pharmacy   Caller: [facility] Reason for Call: Needs renewal Details for Reason: Simvastatin Initial call taken by: Romualdo Bolk, CMA Duncan Dull),  Jul 03, 2009 2:46 PM  Follow-up for Phone Call        ok to refill through  October  when she is due for her yearly CPX ( can disp 6 months worth) Follow-up by: Madelin Headings MD,  Jul 04, 2009 8:13 AM  Additional Follow-up for Phone Call Additional follow up Details #1::        Rx sent electrically, called pt aware of cpx needed in Oct. Additional Follow-up by: Kathrynn Speed CMA,  Jul 04, 2009 11:09 AM    Prescriptions: ZOCOR 20 MG  TABS (SIMVASTATIN) 1 by mouth once daily  #90 x 1   Entered by:   Kathrynn Speed CMA   Authorized by:   Madelin Headings MD   Signed by:   Kathrynn Speed CMA on 07/04/2009   Method used:   Electronically to        [facility]* (mail-order)             ,          Ph: [phone]       Fax: [phone]   RxID:   [id number]

## 2010-07-05 NOTE — Op Note (Signed)
NAME:  Cheryl Robbins, Cheryl Robbins                        ACCOUNT NO.:  [id number]   MEDICAL RECORD NO.:  [id number]                   PATIENT TYPE:  AMB   LOCATION:  SDC                                  FACILITY:  [facility]   PHYSICIAN:  Cynthia P. Romine, M.D.             DATE OF BIRTH:  1945/10/25   DATE OF PROCEDURE:  06/28/2002  DATE OF DISCHARGE:                                 OPERATIVE REPORT   PREOPERATIVE DIAGNOSIS:  A 4 cm clear right adnexal mass.   POSTOPERATIVE DIAGNOSIS:  A 4 cm clear right adnexal mass, pathology  pending.   PROCEDURE:  Laparoscopic bilateral salpingo-oophorectomy.   SURGEON:  Cynthia P. Romine, M.D.   ASSISTANT:  Laqueta Linden, M.D.   ANESTHESIA:  General endotracheal.   ESTIMATED BLOOD LOSS:  50 mL.   COMPLICATIONS:  None.   DESCRIPTION OF PROCEDURE:  The patient was taken to the operating room and  after induction of adequate general endotracheal anesthesia, was placed in  the low dorsal lithotomy position and prepped and draped in the usual  fashion.  A Hulka uterine manipulator was placed.  The bladder was drained  with a red rubber catheter.  A small subumbilical incision was made and the  Veress needle was inserted into the peritoneal space.  Proper placement was  tested and noted to be negative aspirate, then free flow of saline through  the Veress needle again with a negative aspirate, and then by noting the  response of a drop of saline placed at the hub of the Veress needle to  negative pressure as the abdominal wall  was elevated.  Pneumoperitoneum was  created with 2.5 L of CO2 using the automatic insufflator.  A disposable  10/11 mm trocar was then inserted into the peritoneal space and its proper  placement noted with the laparoscope.  Next two small suprapubic incisions  to the right and left of midline were made, and 5 mm trocars were inserted  under direct visualization.  The pelvis was inspected.  The uterus was of a  normal size, shape,  and contour.  The anterior and posterior cul-de-sac were  normal.  The left tube and ovary were normal.  The right ovary was normal.  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  The tripolar cautery was introduced and the tube and  utero-ovarian ligament were freed from the uterus, then two 0 Vicryl  Endoloops were used around the infundibulopelvic ligament, and the specimen  was removed and it was pulled out through the laparoscope trocar sleeve.  On  the right, likewise the tube and utero-ovarian ligament were freed using  tripolar cautery from the uterus.  The infundibulopelvic ligament was  sutured with two 0 Vicryl Endoloops.  The specimen was cut off the pedicle.  This ovary and tube were cut in pieces to enable them to  be removed through  the laparoscopic trocar sleeve, and the 4 cm cyst was aspirated for clear  fluid so that it could be brought out through the umbilical port as well.  The Nezhat irrigator-aspirator was then used to inspect the pedicles.  There  was a small amount of bleeding from the peritoneal edge near the pedicle on  each side that was easily controlled with bipolar cautery.  Again the pelvis  was irrigated and was clearly hemostatic.  The  instruments were removed from the abdomen.  The pneumoperitoneum was allowed  to escape.  The incisions were closed subcuticularly with 4-0 Vicryl Rapide,  instruments were removed from the vagina, and the procedure was terminated.  The patient tolerated it well and was brought in good condition to  postanesthesia recovery.                                               Cynthia P. Romine, M.D.    CPR/MEDQ  D:  06/28/2002  T:  06/29/2002  Job:  [id number]

## 2010-09-08 ENCOUNTER — Inpatient Hospital Stay (INDEPENDENT_AMBULATORY_CARE_PROVIDER_SITE_OTHER)
Admission: RE | Admit: 2010-09-08 | Discharge: 2010-09-08 | Disposition: A | Discharge status: Home / Self Care | Payer: Medicare Other | Source: Ambulatory Visit | Attending: Family Medicine | Admitting: Family Medicine

## 2010-09-08 DIAGNOSIS — N259 Disorder resulting from impaired renal tubular function, unspecified: Secondary | ICD-10-CM

## 2010-09-08 LAB — POCT URINALYSIS DIP (DEVICE)
Glucose, UA: NEGATIVE mg/dL
Nitrite: NEGATIVE
Urobilinogen, UA: 0.2 mg/dL (ref 0.0–1.0)
pH: 6.5 (ref 5.0–8.0)

## 2010-09-09 ENCOUNTER — Ambulatory Visit (INDEPENDENT_AMBULATORY_CARE_PROVIDER_SITE_OTHER): Payer: Medicare Other | Admitting: Internal Medicine

## 2010-09-09 ENCOUNTER — Encounter: Payer: Self-pay | Admitting: Internal Medicine

## 2010-09-09 VITALS — BP 150/98 | HR 120 | Temp 101.7°F | Wt 155.0 lb

## 2010-09-09 DIAGNOSIS — K5289 Other specified noninfective gastroenteritis and colitis: Secondary | ICD-10-CM

## 2010-09-09 DIAGNOSIS — R509 Fever, unspecified: Secondary | ICD-10-CM

## 2010-09-09 DIAGNOSIS — R109 Unspecified abdominal pain: Secondary | ICD-10-CM | POA: Insufficient documentation

## 2010-09-09 DIAGNOSIS — K529 Noninfective gastroenteritis and colitis, unspecified: Secondary | ICD-10-CM | POA: Insufficient documentation

## 2010-09-09 DIAGNOSIS — R030 Elevated blood-pressure reading, without diagnosis of hypertension: Secondary | ICD-10-CM

## 2010-09-09 LAB — CBC WITH DIFFERENTIAL/PLATELET
Basophils Absolute: 0 10*3/uL (ref 0.0–0.1)
HCT: 42.3 % (ref 36.0–46.0)
Lymphs Abs: 1 10*3/uL (ref 0.7–4.0)
MCHC: 34 g/dL (ref 30.0–36.0)
MCV: 86.9 fl (ref 78.0–100.0)
Monocytes Absolute: 0.8 10*3/uL (ref 0.1–1.0)
Platelets: 220 10*3/uL (ref 150.0–400.0)
RDW: 12.5 % (ref 11.5–14.6)

## 2010-09-09 LAB — BASIC METABOLIC PANEL
BUN: 11 mg/dL (ref 6–23)
Chloride: 98 mEq/L (ref 96–112)
Glucose, Bld: 103 mg/dL — ABNORMAL HIGH (ref 70–99)
Potassium: 4 mEq/L (ref 3.5–5.1)

## 2010-09-09 LAB — URINE CULTURE

## 2010-09-09 LAB — POCT URINALYSIS DIPSTICK
Ketones, UA: NEGATIVE
Protein, UA: NEGATIVE
Spec Grav, UA: 1.005
pH, UA: 6.5

## 2010-09-09 LAB — HEPATIC FUNCTION PANEL
Albumin: 4 g/dL (ref 3.5–5.2)
Total Bilirubin: 0.7 mg/dL (ref 0.3–1.2)

## 2010-09-09 NOTE — Patient Instructions (Addendum)
Will notify you  of labs when available. Get stool tests.  This could be a bacterial bowel infection. We can consider getting a ct of abdomen if above unrevealing and  You are not getting better.  May add  Flagyl to  Treat presumed infection  Fu depending on labs and if fever persists.

## 2010-09-09 NOTE — Progress Notes (Signed)
  Subjective:    Patient ID: Cheryl Robbins, female    DOB: 1946-01-28, 65 y.o.   MRN: [id number]  HPI Patient comes in for an acute visit for an illness that began 5 days ago and she was seen in the [facility] in urgent care yesterday. With the diagnosis of acute viral GE.  Onset 4-5 days ago with bloating   And then vomiting for 24 hours . And then began getting yellow je;;y  like  Defecation and runny  Mucous.  Had spelled very badly  also had fever 101.+ and then because she had continued symptoms and abd pain went to Urgent  care.    She did take a Bufferin at some point She was a bit dehydrated but did not get IV fluids a urinalysis was unrevealing no other labs were done Yesterday had menstrual type pain in the abdomen and around back , then  Pain moved to suprapubic area.   Rectal pain. Yesterday when she sat down. She states her abdominal pain moves around. Is not relieved by anything. She has continued fever no joint pains unusual rashes. No dysuria hematemesis.  No known contacts travel undercooked meals history of recent antibiotics or C. differential Review of Systems As per history of present illness had a headache but not today no change in vision no falling.  Past history family history social history reviewed in the electronic medical record.     Objective:   Physical Exam Well-developed well-nourished in no acute distress with a jacket on feeling chilled. She is nontoxic and mildly ill. HEENT: Eyes are clear nonicteric OP clear lips appear dry but mucous membranes are moist TMs are clear   neck supple without masses or thyromegaly or adenopathy Chest:  Clear to A&P without wheezes rales or rhonchi CV:  S1-S2 no gallops or murmurs peripheral perfusion is normal pulse is 90 and regular Abdomen:  Sof,t decreased bowel sounds without hepatosplenomegaly, no rebound or masses no CVA tenderness she does have some mild guarding in the middle lower suprapubic area   skin no acute  changes nonicteric.  Reviewed the emergency room urgent care documentation that was available.  Assessment & Plan:  Acute GE   Prolonged   Fever and  Unusual diarrhea.  She has some abdominal pain that moves around and is not persistent. Does not appear to be obstructive She has prolonged fever by history 4 days we will check laboratory studies and consider other etiologies such as bacterial enteritis  or less likely other processes. Stop the asa.  Consider adding flagyl   After stool collection call us so we may proceed we'll let her know what laboratory studies show in the meantime.   Looks stable today

## 2010-09-10 ENCOUNTER — Telehealth: Payer: Self-pay | Admitting: Internal Medicine

## 2010-09-10 MED ORDER — PROMETHAZINE HCL 12.5 MG RE SUPP: 12.5000 mg | RECTAL | Status: DC | PRN

## 2010-09-10 NOTE — Telephone Encounter (Signed)
Addended by: Romualdo Bolk on: 09/10/2010 01:24 PM   Modules accepted: Orders

## 2010-09-10 NOTE — Telephone Encounter (Signed)
Pt called 7/24. Stated Dr. Fabian Sharp wanted this patient to call in today and give a report. Please call pt at your earliest convenience.

## 2010-09-10 NOTE — Telephone Encounter (Signed)
Spoke to pt- she is feeling better. No fever today and abdominal pain is not as bad. She is still tender and some diarrhea but not as painful as it was.

## 2010-09-11 ENCOUNTER — Telehealth: Payer: Self-pay | Admitting: Internal Medicine

## 2010-09-11 LAB — FECAL LACTOFERRIN, QUANT: Lactoferrin: POSITIVE

## 2010-09-11 NOTE — Telephone Encounter (Signed)
Pt called and said that her fever has returned. Pt req to get lab results from Monday. Also pt wants to know if she in contagious?

## 2010-09-11 NOTE — Telephone Encounter (Signed)
Spoke to pt and she has low grade temp today but not yesterday. Pt states that she is still bloated and tender but not as bad as yesterday. Pt is wanting to know if she is contagious or not.

## 2010-09-11 NOTE — Telephone Encounter (Signed)
Pt aware and is not having any diarrhea or fever now.

## 2010-09-11 NOTE — Telephone Encounter (Signed)
She is probably contagious until her fever and diarrhea is gone  and still waiting on her cultures. If still diarrhea  Add flagyl 500 mg 1 po tid for 10 days   Disp 30#  Her Labs were otherwise ok

## 2010-09-14 LAB — STOOL CULTURE

## 2010-09-16 NOTE — Progress Notes (Signed)
Pt aware of results 

## 2010-11-26 ENCOUNTER — Ambulatory Visit (INDEPENDENT_AMBULATORY_CARE_PROVIDER_SITE_OTHER): Payer: Medicare Other

## 2010-11-26 DIAGNOSIS — Z23 Encounter for immunization: Secondary | ICD-10-CM

## 2010-12-31 ENCOUNTER — Encounter: Payer: Self-pay | Admitting: Internal Medicine

## 2011-01-14 ENCOUNTER — Other Ambulatory Visit: Payer: Self-pay | Admitting: Internal Medicine

## 2011-01-14 MED ORDER — SIMVASTATIN 20 MG PO TABS: 20.0000 mg | ORAL_TABLET | Freq: Every day | ORAL | Status: DC

## 2011-01-14 NOTE — Telephone Encounter (Signed)
rx sent to pharmacy

## 2011-01-14 NOTE — Telephone Encounter (Signed)
Pt called and is req refill of simvastatin (ZOCOR) 20 MG tablet to [facility] mail order

## 2011-04-23 ENCOUNTER — Encounter: Payer: Self-pay | Admitting: Internal Medicine

## 2011-04-23 ENCOUNTER — Ambulatory Visit (INDEPENDENT_AMBULATORY_CARE_PROVIDER_SITE_OTHER): Payer: Medicare Other | Admitting: Internal Medicine

## 2011-04-23 DIAGNOSIS — Z8612 Personal history of poliomyelitis: Secondary | ICD-10-CM | POA: Diagnosis not present

## 2011-04-23 DIAGNOSIS — M949 Disorder of cartilage, unspecified: Secondary | ICD-10-CM

## 2011-04-23 DIAGNOSIS — I10 Essential (primary) hypertension: Secondary | ICD-10-CM

## 2011-04-23 DIAGNOSIS — Z0000 Encounter for general adult medical examination without abnormal findings: Secondary | ICD-10-CM | POA: Diagnosis not present

## 2011-04-23 DIAGNOSIS — K219 Gastro-esophageal reflux disease without esophagitis: Secondary | ICD-10-CM

## 2011-04-23 DIAGNOSIS — J309 Allergic rhinitis, unspecified: Secondary | ICD-10-CM

## 2011-04-23 DIAGNOSIS — E785 Hyperlipidemia, unspecified: Secondary | ICD-10-CM | POA: Diagnosis not present

## 2011-04-23 DIAGNOSIS — M899 Disorder of bone, unspecified: Secondary | ICD-10-CM

## 2011-04-23 DIAGNOSIS — R51 Headache: Secondary | ICD-10-CM

## 2011-04-23 DIAGNOSIS — R030 Elevated blood-pressure reading, without diagnosis of hypertension: Secondary | ICD-10-CM

## 2011-04-23 LAB — CBC WITH DIFFERENTIAL/PLATELET
Basophils Absolute: 0 10*3/uL (ref 0.0–0.1)
Eosinophils Absolute: 0.1 10*3/uL (ref 0.0–0.7)
HCT: 43.5 % (ref 36.0–46.0)
Hemoglobin: 14.7 g/dL (ref 12.0–15.0)
Lymphocytes Relative: 29 % (ref 12.0–46.0)
Lymphs Abs: 1.3 10*3/uL (ref 0.7–4.0)
MCHC: 33.8 g/dL (ref 30.0–36.0)
MCV: 87.1 fl (ref 78.0–100.0)
Monocytes Absolute: 0.4 10*3/uL (ref 0.1–1.0)
Neutro Abs: 2.8 10*3/uL (ref 1.4–7.7)
RDW: 12.8 % (ref 11.5–14.6)

## 2011-04-23 LAB — BASIC METABOLIC PANEL
CO2: 29 mEq/L (ref 19–32)
Calcium: 9.3 mg/dL (ref 8.4–10.5)
Chloride: 104 mEq/L (ref 96–112)
Glucose, Bld: 95 mg/dL (ref 70–99)
Sodium: 140 mEq/L (ref 135–145)

## 2011-04-23 LAB — LIPID PANEL
Cholesterol: 140 mg/dL (ref 0–200)
HDL: 53 mg/dL (ref >39.00)
Triglycerides: 80 mg/dL (ref 0.0–149.0)
VLDL: 16 mg/dL (ref 0.0–40.0)

## 2011-04-23 LAB — HEPATIC FUNCTION PANEL
Albumin: 4.3 g/dL (ref 3.5–5.2)
Total Protein: 6.6 g/dL (ref 6.0–8.3)

## 2011-04-23 MED ORDER — PROMETHAZINE HCL 12.5 MG RE SUPP: 12.5000 mg | RECTAL | Status: DC | PRN

## 2011-04-23 MED ORDER — SIMVASTATIN 20 MG PO TABS: 20.0000 mg | ORAL_TABLET | Freq: Every day | ORAL | Status: DC

## 2011-04-23 NOTE — Patient Instructions (Signed)
Continue healthy lifestyle with diet and exercise.  You do not need a chickenpox vaccine because you had the zoster vaccine and chickenpox. Will refill your medications and let you know when available your lab results.  Make sure you are getting enough calcium and vitamin D in your diet for bone health.

## 2011-04-23 NOTE — Progress Notes (Signed)
Subjective:    Patient ID: Cheryl Robbins, female    DOB: 05-06-1945, 66 y.o.   MRN: [id number]  HPI Patient comes in today for preventive visit and follow-up of medical issues. Update  history since  last visit: No major changes ; ,injury surgery or hospitalizations. She is doing well  LIPIDS no se med noted  Allergy no currently using flonase  And patanol  Has used clinda for sinu sinfections in past  Would like refill phenergan for prn migraine  She rarely gets.  Battling left shoulder pain and under ortho care.      Hearing:   Vision:  No limitations at present .  Safety:  Has smoke detector and wears seat belts.  No firearms. No excess sun exposure. Sees dentist regularly.  Falls:   Advance directive :  Reviewed  Has one.  Memory: Felt to be good  , no concern from her or her family.  Depression: No anhedonia unusual crying or depressive symptoms  Nutrition: Eats well balanced diet; adequate calcium and vitamin D. No swallowing chewiing problems.  Injury: no major injuries in the last six months.  Other healthcare providers:  Reviewed today .  Social:  Lives with husband married. No pets.   Preventive parameters: up-to-date on colonoscopy, mammogram, immunizations. Including Tdap and pneumovax. dexa was 3 12   ADLS:   There are no problems or need for assistance  driving, feeding, obtaining food, dressing, toileting and bathing, managing money using phone. She is independent.     Review of Systems ROS:  GEN/ HEENT: No fever, significant weight changes sweats headaches vision problems hearing changes, CV/ PULM; No chest pain shortness of breath cough, syncope,edema  change in exercise tolerance. GI /GU: No adominal pain, vomiting, change in bowel habits. No blood in the stool. No significant GU symptoms. SKIN/HEME: ,no acute skin rashes suspicious lesions or bleeding. No lymphadenopathy, nodules, masses.  NEURO/ PSYCH:  No neurologic signs such as weakness  numbness. No depression anxiety. IMM/ Allergy: No unusual infections.  Allergy .   REST of 12 system review negative except as per HPI  Past history family history social history reviewed in the electronic medical record. Outpatient Prescriptions Prior to Visit  Medication Sig Dispense Refill  . cholecalciferol (VITAMIN D) 1000 UNITS tablet Take 1,000 Units by mouth daily.        Marland Kitchen co-enzyme Q-10 50 MG capsule Take 50 mg by mouth daily.        Marland Kitchen desloratadine (CLARINEX) 5 MG tablet Take 5 mg by mouth daily.        . fluticasone (FLONASE) 50 MCG/ACT nasal spray Place 2 sprays into the nose daily.        . Guaifenesin (MUCINEX MAXIMUM STRENGTH) 1200 MG TB12 Take by mouth 2 (two) times daily.        . Multiple Vitamins-Minerals (CENTRUM SILVER PO) Take by mouth daily.        . Multiple Vitamins-Minerals (OCUVITE ADULT 50+ PO) Take by mouth daily.        Marland Kitchen olopatadine (PATANOL) 0.1 % ophthalmic solution 1 drop. Use daily as directed       . simvastatin (ZOCOR) 20 MG tablet Take 1 tablet (20 mg total) by mouth at bedtime.  90 tablet  2  . Sulfacetamide Sodium, Acne, 10 % LOTN Apply 1 application topically 2 (two) times daily.        .       Past history family history social history reviewed in  the electronic medical record.      Objective:   Physical Exam Physical Exam: Vital signs reviewed [id number] is a well-developed well-nourished alert cooperative  white female who appears her stated age in no acute distress.  HEENT: normocephalic atraumatic , Eyes: PERRL EOM's full, conjunctiva clear, Nares: paten,t no deformity discharge or tenderness., Ears: no deformity EAC's clear TMs with normal landmarks. Mouth: clear OP, no lesions, edema.  Moist mucous membranes. Dentition in adequate repair. NECK: supple without masses, thyromegaly or bruits. CHEST/PULM:  Clear to auscultation and percussion breath sounds equal no wheeze , rales or rhonchi. No chest wall deformities or tenderness. CV: PMI is  nondisplaced, S1 S2 no gallops, murmurs, rubs. Peripheral pulses are full without delay.No JVD .  ABDOMEN: Bowel sounds normal nontender  No guard or rebound, no hepato splenomegal no CVA tenderness.  No hernia. Extremtities:  No clubbing cyanosis or edema, no acute joint swelling or redness no focal atrophy NEURO:  Oriented x3, cranial nerves 3-12 appear to be intact, no obvious focal weakness,gait within normal limits no abnormal reflexes or asymmetrical SKIN: No acute rashes normal turgor, color, no bruising or petechiae. PSYCH: Oriented, good eye contact, no obvious depression anxiety, cognition and judgment appear normal. LN: no cervical axillary inguinal adenopathy Oriented x 3 and no noted deficits in memory, attention, and speech.      Assessment & Plan:  Preventive Health Care Counseled regarding healthy nutrition, exercise, sleep, injury prevention, calcium vit d and healthy weight .  LIPIDS: on meds   Allergy  Disc use of meds  BP stable on no meds  Skin rosacea   Stable on current meds

## 2011-04-27 ENCOUNTER — Encounter: Payer: Self-pay | Admitting: Internal Medicine

## 2011-04-30 ENCOUNTER — Encounter: Payer: Self-pay | Admitting: *Deleted

## 2011-04-30 NOTE — Progress Notes (Signed)
Quick Note:    Letter sent to pt.  ______

## 2011-11-20 DIAGNOSIS — M25569 Pain in unspecified knee: Secondary | ICD-10-CM | POA: Diagnosis not present

## 2011-11-27 DIAGNOSIS — J0100 Acute maxillary sinusitis, unspecified: Secondary | ICD-10-CM | POA: Diagnosis not present

## 2011-12-05 ENCOUNTER — Ambulatory Visit (INDEPENDENT_AMBULATORY_CARE_PROVIDER_SITE_OTHER): Payer: Medicare Other

## 2011-12-05 DIAGNOSIS — Z23 Encounter for immunization: Secondary | ICD-10-CM

## 2011-12-12 DIAGNOSIS — J0100 Acute maxillary sinusitis, unspecified: Secondary | ICD-10-CM | POA: Diagnosis not present

## 2011-12-24 DIAGNOSIS — Z1231 Encounter for screening mammogram for malignant neoplasm of breast: Secondary | ICD-10-CM | POA: Diagnosis not present

## 2012-04-27 ENCOUNTER — Ambulatory Visit (INDEPENDENT_AMBULATORY_CARE_PROVIDER_SITE_OTHER): Payer: Medicare Other | Admitting: Internal Medicine

## 2012-04-27 ENCOUNTER — Encounter: Payer: Self-pay | Admitting: Internal Medicine

## 2012-04-27 VITALS — BP 170/100 | HR 83 | Temp 98.5°F | Ht 64.75 in | Wt 159.0 lb

## 2012-04-27 DIAGNOSIS — Z8612 Personal history of poliomyelitis: Secondary | ICD-10-CM | POA: Diagnosis not present

## 2012-04-27 DIAGNOSIS — I10 Essential (primary) hypertension: Secondary | ICD-10-CM | POA: Diagnosis not present

## 2012-04-27 DIAGNOSIS — J309 Allergic rhinitis, unspecified: Secondary | ICD-10-CM

## 2012-04-27 DIAGNOSIS — L719 Rosacea, unspecified: Secondary | ICD-10-CM

## 2012-04-27 DIAGNOSIS — M949 Disorder of cartilage, unspecified: Secondary | ICD-10-CM

## 2012-04-27 DIAGNOSIS — E785 Hyperlipidemia, unspecified: Secondary | ICD-10-CM

## 2012-04-27 DIAGNOSIS — K219 Gastro-esophageal reflux disease without esophagitis: Secondary | ICD-10-CM

## 2012-04-27 DIAGNOSIS — R030 Elevated blood-pressure reading, without diagnosis of hypertension: Secondary | ICD-10-CM

## 2012-04-27 DIAGNOSIS — M545 Low back pain: Secondary | ICD-10-CM

## 2012-04-27 DIAGNOSIS — Z8669 Personal history of other diseases of the nervous system and sense organs: Secondary | ICD-10-CM

## 2012-04-27 DIAGNOSIS — M899 Disorder of bone, unspecified: Secondary | ICD-10-CM | POA: Diagnosis not present

## 2012-04-27 DIAGNOSIS — Z0000 Encounter for general adult medical examination without abnormal findings: Secondary | ICD-10-CM

## 2012-04-27 LAB — HEPATIC FUNCTION PANEL
ALT: 17 U/L (ref 0–35)
Bilirubin, Direct: 0.1 mg/dL (ref 0.0–0.3)
Total Bilirubin: 0.7 mg/dL (ref 0.3–1.2)
Total Protein: 6.7 g/dL (ref 6.0–8.3)

## 2012-04-27 LAB — CBC WITH DIFFERENTIAL/PLATELET
Basophils Relative: 0.3 % (ref 0.0–3.0)
Eosinophils Absolute: 0.1 10*3/uL (ref 0.0–0.7)
Eosinophils Relative: 2.2 % (ref 0.0–5.0)
HCT: 44.3 % (ref 36.0–46.0)
Lymphs Abs: 1.2 10*3/uL (ref 0.7–4.0)
MCHC: 33.6 g/dL (ref 30.0–36.0)
MCV: 86.3 fl (ref 78.0–100.0)
Monocytes Absolute: 0.3 10*3/uL (ref 0.1–1.0)
Neutrophils Relative %: 64 % (ref 43.0–77.0)
Platelets: 178 10*3/uL (ref 150.0–400.0)

## 2012-04-27 LAB — BASIC METABOLIC PANEL
BUN: 17 mg/dL (ref 6–23)
CO2: 27 mEq/L (ref 19–32)
Chloride: 102 mEq/L (ref 96–112)
Creatinine, Ser: 0.8 mg/dL (ref 0.4–1.2)
Potassium: 4.4 mEq/L (ref 3.5–5.1)

## 2012-04-27 LAB — LIPID PANEL
Cholesterol: 121 mg/dL (ref 0–200)
Total CHOL/HDL Ratio: 3
Triglycerides: 66 mg/dL (ref 0.0–149.0)

## 2012-04-27 MED ORDER — SULFACETAMIDE SODIUM (ACNE) 10 % EX LOTN: 1.0000 "application " | TOPICAL_LOTION | Freq: Two times a day (BID) | CUTANEOUS | Status: DC

## 2012-04-27 MED ORDER — PROMETHAZINE HCL 12.5 MG RE SUPP: 12.5000 mg | RECTAL | Status: DC | PRN

## 2012-04-27 NOTE — Patient Instructions (Signed)
Plan getting a DEXA scan Laboratory tests today to include vitamin D I agree seeing the allergist again for reevaluation because of your sinus and ear symptoms. Contact us for followup with some progressive back pain or other symptoms that you're concerned about. At this time your exam is good.  Your up-to-date on her immunizations. You were due for colonoscopy in the next year. Inform the providers about the vomiting that she gets after the procedure. It could be the anesthesia.  If things are well preventive visit in a year.

## 2012-04-27 NOTE — Progress Notes (Signed)
Chief Complaint  Patient presents with  . Medicare Wellness    med evaluation also  . Hyperlipidemia    HPI: Patient comes in today for Preventive Medicare wellness visit . No major injuries, ed visits ,hospitalizations , new medications since last visit. Dr Brion Aliment advise she see allergis again cause of sx and  Cough  And hx of allergies  Lipids no se of meds reported  Osteopenia  Asks about dexa  Fu last 2010  ? Vit d situation.  Gets ocass rush upper arms or mid body up not a flush and no fever weakness persistents   Hearing: ok  Vision:  No limitations at present . Last eye check UTD  Safety:  Has smoke detector and wears seat belts.  No firearms. No excess sun exposure. Sees dentist regularly.  Falls: no  Advance directive :  Reviewed    Memory: Felt to be good  , no concern from her or her family.  Depression: No anhedonia unusual crying or depressive symptoms  Nutrition: Eats well balanced diet; adequate calcium and vitamin D. No swallowing chewing problems.  Injury: no major injuries in the last six months.  Other healthcare providers:  Reviewed today .  Social:  Lives with spouse married. No pets.   Preventive parameters: up-to-date  Reviewed   ADLS:   There are no problems or need for assistance  driving, feeding, obtaining food, dressing, toileting and bathing, managing money using phone. She is independent.  EXERCISE/ HABITS  Per week walking  5 x   No tobacco    etoh   ROS:  GEN/ HEENT: No fever, significant weight changes sweats headaches vision problems hearing changes, CV/ PULM; No chest pain shortness of breath cough, syncope,edema  change in exercise tolerance. GI /GU: No adominal pain, vomiting, change in bowel habits. No blood in the stool. No significant GU symptoms. SKIN/HEME: ,no acute skin rashes suspicious lesions or bleeding. No lymphadenopathy, nodules, masses.  NEURO/ PSYCH:  No neurologic signs such as weakness numbness. No depression  anxiety. IMM/ Allergy: No unusual infections.  Allergy .   REST of 12 system review negative except as per HPI   Past Medical History  Diagnosis Date  . Allergy   . GERD (gastroesophageal reflux disease)   . Headache   . Hyperlipidemia   . Low back pain   . Rosacea   . Osteopenia   . 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 Primiparous     Family History  Problem Relation Age of Onset  . Atrial fibrillation Mother     had cv in her 9 s  . Cancer Sister     thryoid  . Fibromyalgia Brother   . Arthritis Mother     had staph infection knee replacment    History   Social History  . Marital Status: Married    Spouse Name: N/A    Number of Children: N/A  . Years of Education: N/A   Social History Main Topics  . Smoking status: Never Smoker   . Smokeless tobacco: None  . Alcohol Use: No  . Drug Use: No  . Sexually Active: None   Other Topics Concern  . None   Social History Narrative   hh of 2   No pets   No ets.   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 co-enzyme Q-10 50 MG capsule Take 50 mg by mouth daily.        Marland Kitchen desloratadine (CLARINEX) 5 MG tablet Take 5 mg by mouth daily.        . fluticasone (FLONASE) 50 MCG/ACT nasal spray Place 2 sprays into the nose daily.       . Guaifenesin (MUCINEX MAXIMUM STRENGTH) 1200 MG TB12 Take by mouth 2 (two) times daily.        . Multiple Vitamins-Minerals (OCUVITE ADULT 50+ PO) Take by mouth daily.        . promethazine (PHENERGAN) 12.5 MG suppository Place 1 suppository (12.5 mg total) rectally every 4 (four) hours as needed.  6 suppository  0  . simvastatin (ZOCOR) 20 MG tablet Take 1 tablet (20 mg total) by mouth at bedtime.  90 tablet  3  . Sulfacetamide Sodium, Acne, 10 % LOTN  Apply 1 application topically 2 (two) times daily.  118 mL  0  . [DISCONTINUED] promethazine (PHENERGAN) 12.5 MG suppository Place 1 suppository (12.5 mg total) rectally every 4 (four) hours as needed.  4 suppository  2  . [DISCONTINUED] Sulfacetamide Sodium, Acne, 10 % LOTN Apply 1 application topically 2 (two) times daily.       . cholecalciferol (VITAMIN D) 1000 UNITS tablet Take 1,000 Units by mouth daily.        . Multiple Vitamins-Minerals (CENTRUM SILVER PO) Take by mouth daily.        Marland Kitchen olopatadine (PATANOL) 0.1 % ophthalmic solution 1 drop. Use daily as directed       No facility-administered encounter medications on file as of 04/27/2012.    EXAM:  BP 170/100  Pulse 83  Temp(Src) 98.5 F (36.9 C) (Oral)  Ht 5' 4.75" (1.645 m)  Wt 159 lb (72.122 kg)  BMI 26.65 kg/m2  SpO2 98%  Body mass index is 26.65 kg/(m^2).  Physical Exam: Vital signs reviewed [id number] is a well-developed well-nourished alert cooperative   who appears stated age in no acute distress.  HEENT: normocephalic atraumatic , Eyes: PERRL EOM's full, conjunctiva clear, Nares: paten,t no deformity discharge or tenderness., Ears: no deformity EAC's clear TMs with normal landmarks. Mouth: clear OP, no lesions, edema.  Moist mucous membranes. Dentition in adequate repair. NECK: supple without masses, thyromegaly or bruits. CHEST/PULM:  Clear to auscultation and percussion breath sounds equal no wheeze , rales or rhonchi. No chest wall deformities or tenderness. CV: PMI is nondisplaced, S1 S2 no gallops, murmurs, rubs. Peripheral pulses are full without delay.No JVD .  Breast: normal by inspection . No dimpling, discharge, masses, tenderness or discharge .  ABDOMEN: Bowel sounds normal nontender  No guard or rebound, no hepato splenomegal no CVA tenderness.  . Extremtities:  No clubbing cyanosis or edema, no acute joint swelling or redness no focal atrophy NEURO:  Oriented x3, cranial nerves 3-12 appear to be intact,  no obvious focal weakness,gait within normal limits no abnormal reflexes or asymmetrical SKIN: No acute rashes normal turgor, color, no bruising or petechiae. minmal to no facial rendess PSYCH: Oriented, good eye contact, no obvious depression anxiety, cognition and judgment appear normal. LN: no cervical axillary inguinal adenopathy No noted deficits in memory, attention, and speech.   Lab Results  Component Value Date   WBC 4.5 04/27/2012   HGB 14.9 04/27/2012   HCT 44.3 04/27/2012  PLT 178.0 04/27/2012   GLUCOSE 102* 04/27/2012   CHOL 121 04/27/2012   TRIG 66.0 04/27/2012   HDL 40.50 04/27/2012   LDLDIRECT 171.0 06/17/2006   LDLCALC 67 04/27/2012   ALT 17 04/27/2012   AST 18 04/27/2012   NA 139 04/27/2012   K 4.4 04/27/2012   CL 102 04/27/2012   CREATININE 0.8 04/27/2012   BUN 17 04/27/2012   CO2 27 04/27/2012   TSH 2.00 04/27/2012    ASSESSMENT AND PLAN:  Discussed the following assessment and plan:  Medicare annual wellness visit, initial - Plan: Basic metabolic panel, CBC with Differential, Hepatic function panel, Lipid panel, TSH  HYPERTENSION - check readings at home toensure normal  pt reports usually ok  - Plan: Basic metabolic panel, CBC with Differential, Hepatic function panel, Lipid panel, TSH  HYPERLIPIDEMIA - med refill  - Plan: Basic metabolic panel, CBC with Differential, Hepatic function panel, Lipid panel, TSH  ALLERGIC RHINITIS - hx of shots  remote increasing sx now  - Plan: Basic metabolic panel, CBC with Differential, Hepatic function panel, Lipid panel, TSH  POLIOMYELITIS, HX OF - Plan: Basic metabolic panel, CBC with Differential, Hepatic function panel, Lipid panel, TSH  OSTEOPENIA - no recnet vit d level renote hx of fosamax use.  - Plan: Basic metabolic panel, CBC with Differential, Hepatic function panel, Lipid panel, TSH, Vitamin D, 25-hydroxy, DG Bone Density, Vitamin D, 25-hydroxy  LOW BACK PAIN - Plan: Basic metabolic panel, CBC with Differential, Hepatic  function panel, Lipid panel, TSH  GERD  ELEVATED BLOOD PRESSURE WITHOUT DIAGNOSIS OF HYPERTENSION  Hx of migraine headaches - rare takes prn phenergan supp. need refill   Rosacea - refill  topical sulfa don't know what the rush feeling is  No new supp niacin etc    prob not important  Clinically fu if  persistent or progressive  Counseled regarding healthy nutrition, exercise, sleep, injury prevention, calcium vit d and healthy weight .  Patient Care Team: Madelin Headings, MD as PCP - General Keturah Barre, MD (Otolaryngology) Benjaman Lobe Cassie Freer., MD (Dermatology) Jacki Cones, MD (Orthopedic Surgery) ed Renae Fickle (Ophthalmology)  Patient Instructions  Plan getting a DEXA scan Laboratory tests today to include vitamin D I agree seeing the allergist again for reevaluation because of your sinus and ear symptoms. Contact us for followup with some progressive back pain or other symptoms that you're concerned about. At this time your exam is good.  Your up-to-date on her immunizations. You were due for colonoscopy in the next year. Inform the providers about the vomiting that she gets after the procedure. It could be the anesthesia.  If things are well preventive visit in a year.    Neta Mends. Panosh M.D.  Health Maintenance  Topic Date Due  . Influenza Vaccine  10/18/2012  . Colonoscopy  02/17/2013  . Mammogram  12/23/2013  . Tetanus/tdap  07/29/2016  . Pneumococcal Polysaccharide Vaccine Age 45 And Over  Completed  . Zostavax  Completed   Health Maintenance Review  dexa last 2010 -1.3 hip  Hx of fosamax in past

## 2012-04-29 ENCOUNTER — Encounter: Payer: Self-pay | Admitting: Internal Medicine

## 2012-04-29 DIAGNOSIS — L719 Rosacea, unspecified: Secondary | ICD-10-CM | POA: Insufficient documentation

## 2012-04-29 DIAGNOSIS — Z0000 Encounter for general adult medical examination without abnormal findings: Secondary | ICD-10-CM | POA: Insufficient documentation

## 2012-04-29 DIAGNOSIS — Z8669 Personal history of other diseases of the nervous system and sense organs: Secondary | ICD-10-CM | POA: Insufficient documentation

## 2012-05-21 ENCOUNTER — Encounter: Payer: Self-pay | Admitting: Internal Medicine

## 2012-05-24 ENCOUNTER — Ambulatory Visit (INDEPENDENT_AMBULATORY_CARE_PROVIDER_SITE_OTHER)
Admission: RE | Admit: 2012-05-24 | Discharge: 2012-05-24 | Disposition: A | Discharge status: Home / Self Care | Payer: Medicare Other | Source: Ambulatory Visit | Attending: Internal Medicine | Admitting: Internal Medicine

## 2012-05-24 DIAGNOSIS — M899 Disorder of bone, unspecified: Secondary | ICD-10-CM | POA: Diagnosis not present

## 2012-05-24 DIAGNOSIS — M949 Disorder of cartilage, unspecified: Secondary | ICD-10-CM

## 2012-06-07 ENCOUNTER — Encounter: Payer: Self-pay | Admitting: Family Medicine

## 2012-06-15 ENCOUNTER — Encounter: Payer: Self-pay | Admitting: Internal Medicine

## 2012-06-15 ENCOUNTER — Ambulatory Visit (INDEPENDENT_AMBULATORY_CARE_PROVIDER_SITE_OTHER): Payer: Medicare Other | Admitting: Internal Medicine

## 2012-06-15 VITALS — BP 142/84 | HR 88 | Temp 98.5°F | Wt 164.0 lb

## 2012-06-15 DIAGNOSIS — I10 Essential (primary) hypertension: Secondary | ICD-10-CM

## 2012-06-15 MED ORDER — LISINOPRIL 5 MG PO TABS: 5.0000 mg | ORAL_TABLET | Freq: Every day | ORAL | Status: DC

## 2012-06-15 NOTE — Patient Instructions (Signed)
Add low dose   Ace inhibitor .     ROV  In about 2 months  With readings Ok to take  ocass decongestant and monitor  Bp readings.

## 2012-06-15 NOTE — Progress Notes (Signed)
Chief Complaint  Patient presents with  . Follow-up    HPI: Here for fu of  High bp readings  comesin with readings and monitor . readings are usually   143-160 range sys and 80 - 90 diastolic .   At home    . Husband helps her get readings   She has a remote history of being on a low dose mild medication. She feels well otherwise has stopped the Sudafed temporarily because of blood pressure but asks about using it as needed for plane rides and sinus congestion. She usually takes Mucinex D. ROS: See pertinent positives and negatives per HPI.  Past Medical History  Diagnosis Date  . Allergy   . GERD (gastroesophageal reflux disease)   . Headache   . Hyperlipidemia   . Low back pain   . Rosacea   . Osteopenia   . 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 Primiparous     Family History  Problem Relation Age of Onset  . Atrial fibrillation Mother     had cv in her 74 s  . Cancer Sister     thryoid  . Fibromyalgia Brother   . Arthritis Mother     had staph infection knee replacment    History   Social History  . Marital Status: Married    Spouse Name: N/A    Number of Children: N/A  . Years of Education: N/A   Social History Main Topics  . Smoking status: Never Smoker   . Smokeless tobacco: None  . Alcohol Use: No  . Drug Use: No  . Sexually Active: None   Other Topics Concern  . None   Social History Narrative   hh of 2   No pets   No ets.   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 cholecalciferol (VITAMIN D) 1000 UNITS tablet Take 1,000 Units by mouth daily.        Marland Kitchen co-enzyme Q-10 50 MG capsule Take 50 mg by mouth daily.        Marland Kitchen desloratadine (CLARINEX) 5 MG tablet Take 5 mg by mouth daily.        . Guaifenesin (MUCINEX  MAXIMUM STRENGTH) 1200 MG TB12 Take by mouth 2 (two) times daily.        . Multiple Vitamins-Minerals (CENTRUM SILVER PO) Take by mouth daily.        . Multiple Vitamins-Minerals (OCUVITE ADULT 50+ PO) Take by mouth daily.        Marland Kitchen olopatadine (PATANOL) 0.1 % ophthalmic solution 1 drop. Use daily as directed      . promethazine (PHENERGAN) 12.5 MG suppository Place 1 suppository (12.5 mg total) rectally every 4 (four) hours as needed.  6 suppository  0  . simvastatin (ZOCOR) 20 MG tablet Take 1 tablet (20 mg total) by mouth at bedtime.  90 tablet  3  . Sulfacetamide Sodium, Acne, 10 % LOTN Apply 1 application topically 2 (two) times daily.  118 mL  0  . lisinopril (PRINIVIL,ZESTRIL) 5 MG tablet Take 1 tablet (5 mg total) by mouth daily.  90 tablet  1  . [DISCONTINUED] fluticasone (FLONASE) 50 MCG/ACT nasal spray Place 2 sprays into the nose  daily.        No facility-administered encounter medications on file as of 06/15/2012.    EXAM:  BP 142/84  Pulse 88  Temp(Src) 98.5 F (36.9 C) (Oral)  Wt 164 lb (74.39 kg)  BMI 27.49 kg/m2  SpO2 96%  Body mass index is 27.49 kg/(m^2). 162/92 ;left arm legs crossed . Repeat .    150/87    My reading left arm sitting 148/80 after above readings GENERAL: vitals reviewed and listed above, alert, oriented, appears well hydrated and in no acute distress  HEENT: atraumatic, conjunctiva  clear, no obvious abnormalities on inspection of external nose and ears OP : no lesion edema or exudate  NECK: no obvious masses on inspection palpation  CV: HRRR, no clubbing cyanosis or  peripheral edema nl cap refill  MS: moves all extremities without noticeable focal  abnormality PSYCH: pleasant and cooperative, no obvious depression or anxiety Lab Results  Component Value Date   WBC 4.5 04/27/2012   HGB 14.9 04/27/2012   HCT 44.3 04/27/2012   PLT 178.0 04/27/2012   GLUCOSE 102* 04/27/2012   CHOL 121 04/27/2012   TRIG 66.0 04/27/2012   HDL 40.50 04/27/2012    LDLDIRECT 171.0 06/17/2006   LDLCALC 67 04/27/2012   ALT 17 04/27/2012   AST 18 04/27/2012   NA 139 04/27/2012   K 4.4 04/27/2012   CL 102 04/27/2012   CREATININE 0.8 04/27/2012   BUN 17 04/27/2012   CO2 27 04/27/2012   TSH 2.00 04/27/2012    ASSESSMENT AND PLAN:  Discussed the following assessment and plan:  HYPERTENSION - Elevated by both machines begin very low dose lisinopril 5 mg followup in 2 months discussed risk-benefit medication It is reasonable to take her Mucinex D. when she travels or as needed but to just monitor her blood pressure would just avoided during uncontrolled very high hypertension. Plan followup in 2 months we can do laboratory recheck at that time contact us in the meantime if needed we can increase her dose to 10 mg if needed. -Patient advised to return or notify health care team  if symptoms worsen or persist or new concerns arise.  Patient Instructions  Add low dose   Ace inhibitor .     ROV  In about 2 months  With readings Ok to take  ocass decongestant and monitor  Bp readings.    Neta Mends. Panosh M.D.

## 2012-07-07 ENCOUNTER — Telehealth: Payer: Self-pay | Admitting: Internal Medicine

## 2012-07-07 MED ORDER — SIMVASTATIN 20 MG PO TABS: 20.0000 mg | ORAL_TABLET | Freq: Every day | ORAL | Status: DC

## 2012-07-07 NOTE — Telephone Encounter (Signed)
Sent to [facility] by e-scribe.

## 2012-07-07 NOTE — Telephone Encounter (Signed)
Pt needs refill of simvastatin (ZOCOR) 20 MG tablet   90 day supply [facility]

## 2012-07-08 DIAGNOSIS — D3130 Benign neoplasm of unspecified choroid: Secondary | ICD-10-CM | POA: Diagnosis not present

## 2012-08-18 ENCOUNTER — Encounter: Payer: Self-pay | Admitting: Internal Medicine

## 2012-08-18 ENCOUNTER — Ambulatory Visit (INDEPENDENT_AMBULATORY_CARE_PROVIDER_SITE_OTHER): Payer: Medicare Other | Admitting: Internal Medicine

## 2012-08-18 VITALS — BP 142/86 | HR 81 | Temp 98.0°F | Wt 159.0 lb

## 2012-08-18 DIAGNOSIS — I10 Essential (primary) hypertension: Secondary | ICD-10-CM | POA: Diagnosis not present

## 2012-08-18 DIAGNOSIS — R05 Cough: Secondary | ICD-10-CM

## 2012-08-18 DIAGNOSIS — R059 Cough, unspecified: Secondary | ICD-10-CM | POA: Insufficient documentation

## 2012-08-18 DIAGNOSIS — J309 Allergic rhinitis, unspecified: Secondary | ICD-10-CM

## 2012-08-18 MED ORDER — LISINOPRIL 10 MG PO TABS: 10.0000 mg | ORAL_TABLET | Freq: Every day | ORAL | Status: DC

## 2012-08-18 NOTE — Progress Notes (Signed)
Chief Complaint  Patient presents with  . Follow-up    HPI: Fu bp  Med inititation  Readings  130 - 150 range diastolic  80s to 16X   No se of med?  Had ocass dry cough predated  Med  ? Allergy uncertain if  Worse. Not every day. No cpsob other concerns with this  No new rx for this .  Has gained weight 5-8 #with disneuy trip   Etc. May be adding to bp issues  ROS: See pertinent positives and negatives per HPI. No cp sob falling   New gi rep issues  No ets no syncope  Past Medical History  Diagnosis Date  . Allergy   . GERD (gastroesophageal reflux disease)   . Headache(784.0)   . Hyperlipidemia   . Low back pain   . Rosacea   . Osteopenia   . 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 Primiparous     Family History  Problem Relation Age of Onset  . Atrial fibrillation Mother     had cv in her 30 s  . Cancer Sister     thryoid  . Fibromyalgia Brother   . Arthritis Mother     had staph infection knee replacment    History   Social History  . Marital Status: Married    Spouse Name: N/A    Number of Children: N/A  . Years of Education: N/A   Social History Main Topics  . Smoking status: Never Smoker   . Smokeless tobacco: None  . Alcohol Use: No  . Drug Use: No  . Sexually Active: None   Other Topics Concern  . None   Social History Narrative   hh of 2   No pets   No ets.   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 cholecalciferol (VITAMIN D) 1000 UNITS tablet Take 1,000 Units by mouth daily.        Marland Kitchen co-enzyme Q-10 50 MG capsule Take 50 mg by mouth daily.        Marland Kitchen desloratadine (CLARINEX) 5 MG tablet Take 5 mg by mouth daily.        . Guaifenesin (MUCINEX MAXIMUM STRENGTH) 1200 MG TB12 Take by mouth 2 (two) times daily.         Marland Kitchen lisinopril (PRINIVIL,ZESTRIL) 10 MG tablet Take 1 tablet (10 mg total) by mouth daily.  90 tablet  1  . Multiple Vitamins-Minerals (CENTRUM SILVER PO) Take by mouth daily.        . Multiple Vitamins-Minerals (OCUVITE ADULT 50+ PO) Take by mouth daily.        Marland Kitchen olopatadine (PATANOL) 0.1 % ophthalmic solution 1 drop. Use daily as directed      . promethazine (PHENERGAN) 12.5 MG suppository Place 1 suppository (12.5 mg total) rectally every 4 (four) hours as needed.  6 suppository  0  . simvastatin (ZOCOR) 20 MG tablet Take 1 tablet (20 mg total) by mouth at bedtime.  90 tablet  2  . Sulfacetamide Sodium, Acne, 10 % LOTN Apply 1 application topically 2 (two) times daily.  118 mL  0  . [DISCONTINUED] lisinopril (PRINIVIL,ZESTRIL) 5 MG tablet Take 1 tablet (5 mg total) by mouth  daily.  90 tablet  1   No facility-administered encounter medications on file as of 08/18/2012.    EXAM:  BP 142/86  Pulse 81  Temp(Src) 98 F (36.7 C) (Oral)  Wt 159 lb (72.122 kg)  BMI 26.65 kg/m2  SpO2 97%  Body mass index is 26.65 kg/(m^2).  GENERAL: vitals reviewed and listed above, alert, oriented, appears well hydrated and in no acute distress Mild congestion  Neck no masses obvious  LUNGS: clear to auscultation bilaterally, no wheezes, rales or rhonchi, good air movement CV: HRRR, no clubbing cyanosis or  peripheral edema nl cap refill  Repeat BP 145/80 right . PSYCH: pleasant and cooperative, no obvious depression or anxiety reviewe BP log  Lab Results  Component Value Date   WBC 4.5 04/27/2012   HGB 14.9 04/27/2012   HCT 44.3 04/27/2012   PLT 178.0 04/27/2012   GLUCOSE 102* 04/27/2012   CHOL 121 04/27/2012   TRIG 66.0 04/27/2012   HDL 40.50 04/27/2012   LDLDIRECT 171.0 06/17/2006   LDLCALC 67 04/27/2012   ALT 17 04/27/2012   AST 18 04/27/2012   NA 139 04/27/2012   K 4.4 04/27/2012   CL 102 04/27/2012   CREATININE 0.8 04/27/2012   BUN 17 04/27/2012   CO2 27 04/27/2012   TSH 2.00 04/27/2012     ASSESSMENT AND PLAN:  Discussed the following assessment and plan:  HYPERTENSION - improved but not at goal  increase to 10 mg   Cough - minor intermittent pt thinks allergy will follow if felt from med may change  Will get labs  Bmp next visit as appropriate .  -Patient advised to return or notify health care team  if symptoms worsen or persist or new concerns arise.  Patient Instructions  Increase the   Lisinopril 10 mg  Per day  Continue to monitor   bp readings. In the meantime .  ROV in 2-3  months  .   Neta Mends. Panosh M.D.

## 2012-08-18 NOTE — Patient Instructions (Addendum)
Increase the   Lisinopril 10 mg  Per day  Continue to monitor   bp readings. In the meantime .  ROV in 2-3  months  .

## 2012-09-08 DIAGNOSIS — L219 Seborrheic dermatitis, unspecified: Secondary | ICD-10-CM | POA: Diagnosis not present

## 2012-09-08 DIAGNOSIS — L820 Inflamed seborrheic keratosis: Secondary | ICD-10-CM | POA: Diagnosis not present

## 2012-09-08 DIAGNOSIS — D235 Other benign neoplasm of skin of trunk: Secondary | ICD-10-CM | POA: Diagnosis not present

## 2012-09-27 ENCOUNTER — Encounter: Payer: Self-pay | Admitting: Internal Medicine

## 2012-10-21 ENCOUNTER — Ambulatory Visit (INDEPENDENT_AMBULATORY_CARE_PROVIDER_SITE_OTHER): Payer: Medicare Other | Admitting: Internal Medicine

## 2012-10-21 ENCOUNTER — Encounter: Payer: Self-pay | Admitting: Internal Medicine

## 2012-10-21 VITALS — BP 166/94 | HR 75 | Temp 98.3°F | Wt 160.0 lb

## 2012-10-21 DIAGNOSIS — M79671 Pain in right foot: Secondary | ICD-10-CM

## 2012-10-21 DIAGNOSIS — M79609 Pain in unspecified limb: Secondary | ICD-10-CM | POA: Diagnosis not present

## 2012-10-21 DIAGNOSIS — I10 Essential (primary) hypertension: Secondary | ICD-10-CM

## 2012-10-21 DIAGNOSIS — M79673 Pain in unspecified foot: Secondary | ICD-10-CM | POA: Insufficient documentation

## 2012-10-21 DIAGNOSIS — Z23 Encounter for immunization: Secondary | ICD-10-CM

## 2012-10-21 DIAGNOSIS — Z6379 Other stressful life events affecting family and household: Secondary | ICD-10-CM | POA: Diagnosis not present

## 2012-10-21 MED ORDER — LISINOPRIL 20 MG PO TABS: 20.0000 mg | ORAL_TABLET | Freq: Every day | ORAL | Status: DC

## 2012-10-21 NOTE — Patient Instructions (Signed)
Continue monitoring blood pressure at least 3 times a week send in readings after 3-4 weeks and we will make a decision about further treatment. Increase lisinopril to 20 mg a day.    Will plan followup depending on above. Heel pain could be plantar fasciitis get x-ray to rule out foreign body other problems.  Take plantar fasciitis measure in if persistent progressive we can have you see sports medicine.  Plantar Fasciitis (Heel Spur Syndrome) with Rehab The plantar fascia is a fibrous, ligament-like, soft-tissue structure that spans the bottom of the foot. Plantar fasciitis is a condition that causes pain in the foot due to inflammation of the tissue. SYMPTOMS   Pain and tenderness on the underneath side of the foot.  Pain that worsens with standing or walking. CAUSES  Plantar fasciitis is caused by irritation and injury to the plantar fascia on the underneath side of the foot. Common mechanisms of injury include:  Direct trauma to bottom of the foot.  Damage to a small nerve that runs under the foot where the main fascia attaches to the heel bone.  Stress placed on the plantar fascia due to bone spurs. RISK INCREASES WITH:   Activities that place stress on the plantar fascia (running, jumping, pivoting, or cutting).  Poor strength and flexibility.  Improperly fitted shoes.  Tight calf muscles.  Flat feet.  Failure to warm-up properly before activity.  Obesity. PREVENTION  Warm up and stretch properly before activity.  Allow for adequate recovery between workouts.  Maintain physical fitness:  Strength, flexibility, and endurance.  Cardiovascular fitness.  Maintain a health body weight.  Avoid stress on the plantar fascia.  Wear properly fitted shoes, including arch supports for individuals who have flat feet. PROGNOSIS  If treated properly, then the symptoms of plantar fasciitis usually resolve without surgery. However, occasionally surgery is  necessary. RELATED COMPLICATIONS   Recurrent symptoms that may result in a chronic condition.  Problems of the lower back that are caused by compensating for the injury, such as limping.  Pain or weakness of the foot during push-off following surgery.  Chronic inflammation, scarring, and partial or complete fascia tear, occurring more often from repeated injections. TREATMENT  Treatment initially involves the use of ice and medication to help reduce pain and inflammation. The use of strengthening and stretching exercises may help reduce pain with activity, especially stretches of the Achilles tendon. These exercises may be performed at home or with a therapist. Your caregiver may recommend that you use heel cups of arch supports to help reduce stress on the plantar fascia. Occasionally, corticosteroid injections are given to reduce inflammation. If symptoms persist for greater than 6 months despite non-surgical (conservative), then surgery may be recommended.  MEDICATION   If pain medication is necessary, then nonsteroidal anti-inflammatory medications, such as aspirin and ibuprofen, or other minor pain relievers, such as acetaminophen, are often recommended.  Do not take pain medication within 7 days before surgery.  Prescription pain relievers may be given if deemed necessary by your caregiver. Use only as directed and only as much as you need.  Corticosteroid injections may be given by your caregiver. These injections should be reserved for the most serious cases, because they may only be given a certain number of times. HEAT AND COLD  Cold treatment (icing) relieves pain and reduces inflammation. Cold treatment should be applied for 10 to 15 minutes every 2 to 3 hours for inflammation and pain and immediately after any activity that aggravates your symptoms.  Use ice packs or massage the area with a piece of ice (ice massage).  Heat treatment may be used prior to performing the stretching  and strengthening activities prescribed by your caregiver, physical therapist, or athletic trainer. Use a heat pack or soak the injury in warm water. SEEK IMMEDIATE MEDICAL CARE IF:  Treatment seems to offer no benefit, or the condition worsens.  Any medications produce adverse side effects. EXERCISES RANGE OF MOTION (ROM) AND STRETCHING EXERCISES - Plantar Fasciitis (Heel Spur Syndrome) These exercises may help you when beginning to rehabilitate your injury. Your symptoms may resolve with or without further involvement from your physician, physical therapist or athletic trainer. While completing these exercises, remember:   Restoring tissue flexibility helps normal motion to return to the joints. This allows healthier, less painful movement and activity.  An effective stretch should be held for at least 30 seconds.  A stretch should never be painful. You should only feel a gentle lengthening or release in the stretched tissue. RANGE OF MOTION - Toe Extension, Flexion  Sit with your right / left leg crossed over your opposite knee.  Grasp your toes and gently pull them back toward the top of your foot. You should feel a stretch on the bottom of your toes and/or foot.  Hold this stretch for __________ seconds.  Now, gently pull your toes toward the bottom of your foot. You should feel a stretch on the top of your toes and or foot.  Hold this stretch for __________ seconds. Repeat __________ times. Complete this stretch __________ times per day.  RANGE OF MOTION - Ankle Dorsiflexion, Active Assisted  Remove shoes and sit on a chair that is preferably not on a carpeted surface.  Place right / left foot under knee. Extend your opposite leg for support.  Keeping your heel down, slide your right / left foot back toward the chair until you feel a stretch at your ankle or calf. If you do not feel a stretch, slide your bottom forward to the edge of the chair, while still keeping your heel  down.  Hold this stretch for __________ seconds. Repeat __________ times. Complete this stretch __________ times per day.  STRETCH  Gastroc, Standing  Place hands on wall.  Extend right / left leg, keeping the front knee somewhat bent.  Slightly point your toes inward on your back foot.  Keeping your right / left heel on the floor and your knee straight, shift your weight toward the wall, not allowing your back to arch.  You should feel a gentle stretch in the right / left calf. Hold this position for __________ seconds. Repeat __________ times. Complete this stretch __________ times per day. STRETCH  Soleus, Standing  Place hands on wall.  Extend right / left leg, keeping the other knee somewhat bent.  Slightly point your toes inward on your back foot.  Keep your right / left heel on the floor, bend your back knee, and slightly shift your weight over the back leg so that you feel a gentle stretch deep in your back calf.  Hold this position for __________ seconds. Repeat __________ times. Complete this stretch __________ times per day. STRETCH  Gastrocsoleus, Standing  Note: This exercise can place a lot of stress on your foot and ankle. Please complete this exercise only if specifically instructed by your caregiver.   Place the ball of your right / left foot on a step, keeping your other foot firmly on the same step.  Hold  on to the wall or a rail for balance.  Slowly lift your other foot, allowing your body weight to press your heel down over the edge of the step.  You should feel a stretch in your right / left calf.  Hold this position for __________ seconds.  Repeat this exercise with a slight bend in your right / left knee. Repeat __________ times. Complete this stretch __________ times per day.  STRENGTHENING EXERCISES - Plantar Fasciitis (Heel Spur Syndrome)  These exercises may help you when beginning to rehabilitate your injury. They may resolve your symptoms with  or without further involvement from your physician, physical therapist or athletic trainer. While completing these exercises, remember:   Muscles can gain both the endurance and the strength needed for everyday activities through controlled exercises.  Complete these exercises as instructed by your physician, physical therapist or athletic trainer. Progress the resistance and repetitions only as guided. STRENGTH - Towel Curls  Sit in a chair positioned on a non-carpeted surface.  Place your foot on a towel, keeping your heel on the floor.  Pull the towel toward your heel by only curling your toes. Keep your heel on the floor.  If instructed by your physician, physical therapist or athletic trainer, add ____________________ at the end of the towel. Repeat __________ times. Complete this exercise __________ times per day. STRENGTH - Ankle Inversion  Secure one end of a rubber exercise band/tubing to a fixed object (table, pole). Loop the other end around your foot just before your toes.  Place your fists between your knees. This will focus your strengthening at your ankle.  Slowly, pull your big toe up and in, making sure the band/tubing is positioned to resist the entire motion.  Hold this position for __________ seconds.  Have your muscles resist the band/tubing as it slowly pulls your foot back to the starting position. Repeat __________ times. Complete this exercises __________ times per day.  Document Released: 02/03/2005 Document Revised: 04/28/2011 Document Reviewed: 05/18/2008 [facility] Patient Information 2014 [place], [place].

## 2012-10-21 NOTE — Progress Notes (Signed)
Chief Complaint  Patient presents with  . Follow-up    HPI: Fu bp   Control Since last visit she's been taking 10 mg lisinopril a day. She presents her blood pressure readings anywhere from 125 2 150/71-86. Most recent ones were in the 140s.  No noted side effects of the medicine although still does have allergy cough at times. She does get waves of what sounds like nausea without associated symptoms not associated with medication or other  Family stress husband with prostate cancer has now failed hormonal therapy and it is spreading and the plan is to see an oncology specialist or a lot of unknowns.  Causing significant stress.  ROS: See pertinent positives and negatives per HPI. Has had a problem with her right heel for couple months almost feels like she stepped on something but doesn't remember that does have heel cups wonders if she should be seen for this. Hurts sometimes at night sometimes with stepping on it in the morning.  Past Medical History  Diagnosis Date  . Allergy   . GERD (gastroesophageal reflux disease)   . Headache(784.0)   . Hyperlipidemia   . Low back pain   . Rosacea   . Osteopenia   . 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 Primiparous     Family History  Problem Relation Age of Onset  . Atrial fibrillation Mother     had cv in her 75 s  . Cancer Sister     thryoid  . Fibromyalgia Brother   . Arthritis Mother     had staph infection knee replacment    History   Social History  . Marital Status: Married    Spouse Name: N/A    Number of Children: N/A  . Years of Education: N/A   Social History Main Topics  . Smoking status: Never Smoker   . Smokeless tobacco: None  . Alcohol Use: No  . Drug Use: No  . Sexual Activity: None   Other Topics Concern  . None   Social History Narrative   hh of 2   No pets   No ets.   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 cholecalciferol (VITAMIN D) 1000 UNITS tablet Take 1,000 Units by mouth daily.        Marland Kitchen co-enzyme Q-10 50 MG capsule Take 50 mg by mouth daily.        Marland Kitchen desloratadine (CLARINEX) 5 MG tablet Take 5 mg by mouth daily.        . Guaifenesin (MUCINEX MAXIMUM STRENGTH) 1200 MG TB12 Take by mouth 2 (two) times daily.        . Multiple Vitamins-Minerals (CENTRUM SILVER PO) Take by mouth daily.        . promethazine (PHENERGAN) 12.5 MG suppository Place 1 suppository (12.5 mg total) rectally every 4 (four) hours as needed.  6 suppository  0  . simvastatin (ZOCOR) 20 MG tablet Take 1 tablet (20 mg total) by mouth at bedtime.  90 tablet  2  . Sulfacetamide Sodium, Acne, 10 % LOTN Apply 1 application topically 2 (two) times daily.  118 mL  0  . [DISCONTINUED] lisinopril (PRINIVIL,ZESTRIL) 10 MG tablet Take 1 tablet (  10 mg total) by mouth daily.  90 tablet  1  . lisinopril (PRINIVIL,ZESTRIL) 20 MG tablet Take 1 tablet (20 mg total) by mouth daily.  90 tablet  3  . [DISCONTINUED] Multiple Vitamins-Minerals (OCUVITE ADULT 50+ PO) Take by mouth daily.        . [DISCONTINUED] olopatadine (PATANOL) 0.1 % ophthalmic solution 1 drop. Use daily as directed       No facility-administered encounter medications on file as of 10/21/2012.    EXAM:  BP 166/94  Pulse 75  Temp(Src) 98.3 F (36.8 C) (Oral)  Wt 160 lb (72.576 kg)  BMI 26.82 kg/m2  SpO2 98%  Body mass index is 26.82 kg/(m^2).  GENERAL: vitals reviewed and listed above, alert, oriented, appears well hydrated and in no acute distress appropriate for situation tearful at times when speaking of her husband predicament.  HEENT: atraumatic, conjunctiva  clear, no obvious abnormalities on inspection of external nose and ears NECK: no obvious masses on inspection palpation  Pressure repeated and  confirmed. CV: HRRR, no clubbing cyanosis or  peripheral edema nl cap refill  PSYCH: pleasant and cooperative,  tearful at times appropriate for the situation good eye contact Right heel slight callus med he'll but no lesion does fill full fat pad tenderness area otherwise normal foot normal pulses ASSESSMENT AND PLAN:  Discussed the following assessment and plan:  HYPERTENSION - Up today in office probably from distress readings in the 140s mostly at high him increased medication consider adding low-dose diuretic combination pill if not  Heel pain, right - Plan: DG Foot Complete Right  Stress due to illness of family member - Discussion counseled  Need for prophylactic vaccination and inoculation against influenza - Plan: Flu Vaccine QUAD 36+ mos PF IM (Fluarix) Uncertain if gait residual polio could be related check x-ray rule out foreign body to interventions as per plantar fasciitis and if persistent progressive see orthopedics or sports medicine. Have her contact us in 3-4 weeks about her readings make a decision about followup visit further medication changes lab work when appropriate. -Patient advised to return or notify health care team  if symptoms worsen or persist or new concerns arise.  Patient Instructions  Continue monitoring blood pressure at least 3 times a week send in readings after 3-4 weeks and we will make a decision about further treatment. Increase lisinopril to 20 mg a day.    Will plan followup depending on above. Heel pain could be plantar fasciitis get x-ray to rule out foreign body other problems.  Take plantar fasciitis measure in if persistent progressive we can have you see sports medicine.  Plantar Fasciitis (Heel Spur Syndrome) with Rehab The plantar fascia is a fibrous, ligament-like, soft-tissue structure that spans the bottom of the foot. Plantar fasciitis is a condition that causes pain in the foot due to inflammation of the tissue. SYMPTOMS   Pain  and tenderness on the underneath side of the foot.  Pain that worsens with standing or walking. CAUSES  Plantar fasciitis is caused by irritation and injury to the plantar fascia on the underneath side of the foot. Common mechanisms of injury include:  Direct trauma to bottom of the foot.  Damage to a small nerve that runs under the foot where the main fascia attaches to the heel bone.  Stress placed on the plantar fascia due to bone spurs. RISK INCREASES WITH:   Activities that place stress on the plantar fascia (running, jumping, pivoting, or cutting).  Poor strength and  flexibility.  Improperly fitted shoes.  Tight calf muscles.  Flat feet.  Failure to warm-up properly before activity.  Obesity. PREVENTION  Warm up and stretch properly before activity.  Allow for adequate recovery between workouts.  Maintain physical fitness:  Strength, flexibility, and endurance.  Cardiovascular fitness.  Maintain a health body weight.  Avoid stress on the plantar fascia.  Wear properly fitted shoes, including arch supports for individuals who have flat feet. PROGNOSIS  If treated properly, then the symptoms of plantar fasciitis usually resolve without surgery. However, occasionally surgery is necessary. RELATED COMPLICATIONS   Recurrent symptoms that may result in a chronic condition.  Problems of the lower back that are caused by compensating for the injury, such as limping.  Pain or weakness of the foot during push-off following surgery.  Chronic inflammation, scarring, and partial or complete fascia tear, occurring more often from repeated injections. TREATMENT  Treatment initially involves the use of ice and medication to help reduce pain and inflammation. The use of strengthening and stretching exercises may help reduce pain with activity, especially stretches of the Achilles tendon. These exercises may be performed at home or with a therapist. Your caregiver may  recommend that you use heel cups of arch supports to help reduce stress on the plantar fascia. Occasionally, corticosteroid injections are given to reduce inflammation. If symptoms persist for greater than 6 months despite non-surgical (conservative), then surgery may be recommended.  MEDICATION   If pain medication is necessary, then nonsteroidal anti-inflammatory medications, such as aspirin and ibuprofen, or other minor pain relievers, such as acetaminophen, are often recommended.  Do not take pain medication within 7 days before surgery.  Prescription pain relievers may be given if deemed necessary by your caregiver. Use only as directed and only as much as you need.  Corticosteroid injections may be given by your caregiver. These injections should be reserved for the most serious cases, because they may only be given a certain number of times. HEAT AND COLD  Cold treatment (icing) relieves pain and reduces inflammation. Cold treatment should be applied for 10 to 15 minutes every 2 to 3 hours for inflammation and pain and immediately after any activity that aggravates your symptoms. Use ice packs or massage the area with a piece of ice (ice massage).  Heat treatment may be used prior to performing the stretching and strengthening activities prescribed by your caregiver, physical therapist, or athletic trainer. Use a heat pack or soak the injury in warm water. SEEK IMMEDIATE MEDICAL CARE IF:  Treatment seems to offer no benefit, or the condition worsens.  Any medications produce adverse side effects. EXERCISES RANGE OF MOTION (ROM) AND STRETCHING EXERCISES - Plantar Fasciitis (Heel Spur Syndrome) These exercises may help you when beginning to rehabilitate your injury. Your symptoms may resolve with or without further involvement from your physician, physical therapist or athletic trainer. While completing these exercises, remember:   Restoring tissue flexibility helps normal motion to  return to the joints. This allows healthier, less painful movement and activity.  An effective stretch should be held for at least 30 seconds.  A stretch should never be painful. You should only feel a gentle lengthening or release in the stretched tissue. RANGE OF MOTION - Toe Extension, Flexion  Sit with your right / left leg crossed over your opposite knee.  Grasp your toes and gently pull them back toward the top of your foot. You should feel a stretch on the bottom of your toes and/or foot.  Hold this stretch for __________ seconds.  Now, gently pull your toes toward the bottom of your foot. You should feel a stretch on the top of your toes and or foot.  Hold this stretch for __________ seconds. Repeat __________ times. Complete this stretch __________ times per day.  RANGE OF MOTION - Ankle Dorsiflexion, Active Assisted  Remove shoes and sit on a chair that is preferably not on a carpeted surface.  Place right / left foot under knee. Extend your opposite leg for support.  Keeping your heel down, slide your right / left foot back toward the chair until you feel a stretch at your ankle or calf. If you do not feel a stretch, slide your bottom forward to the edge of the chair, while still keeping your heel down.  Hold this stretch for __________ seconds. Repeat __________ times. Complete this stretch __________ times per day.  STRETCH  Gastroc, Standing  Place hands on wall.  Extend right / left leg, keeping the front knee somewhat bent.  Slightly point your toes inward on your back foot.  Keeping your right / left heel on the floor and your knee straight, shift your weight toward the wall, not allowing your back to arch.  You should feel a gentle stretch in the right / left calf. Hold this position for __________ seconds. Repeat __________ times. Complete this stretch __________ times per day. STRETCH  Soleus, Standing  Place hands on wall.  Extend right / left leg,  keeping the other knee somewhat bent.  Slightly point your toes inward on your back foot.  Keep your right / left heel on the floor, bend your back knee, and slightly shift your weight over the back leg so that you feel a gentle stretch deep in your back calf.  Hold this position for __________ seconds. Repeat __________ times. Complete this stretch __________ times per day. STRETCH  Gastrocsoleus, Standing  Note: This exercise can place a lot of stress on your foot and ankle. Please complete this exercise only if specifically instructed by your caregiver.   Place the ball of your right / left foot on a step, keeping your other foot firmly on the same step.  Hold on to the wall or a rail for balance.  Slowly lift your other foot, allowing your body weight to press your heel down over the edge of the step.  You should feel a stretch in your right / left calf.  Hold this position for __________ seconds.  Repeat this exercise with a slight bend in your right / left knee. Repeat __________ times. Complete this stretch __________ times per day.  STRENGTHENING EXERCISES - Plantar Fasciitis (Heel Spur Syndrome)  These exercises may help you when beginning to rehabilitate your injury. They may resolve your symptoms with or without further involvement from your physician, physical therapist or athletic trainer. While completing these exercises, remember:   Muscles can gain both the endurance and the strength needed for everyday activities through controlled exercises.  Complete these exercises as instructed by your physician, physical therapist or athletic trainer. Progress the resistance and repetitions only as guided. STRENGTH - Towel Curls  Sit in a chair positioned on a non-carpeted surface.  Place your foot on a towel, keeping your heel on the floor.  Pull the towel toward your heel by only curling your toes. Keep your heel on the floor.  If instructed by your physician, physical  therapist or athletic trainer, add ____________________ at the end of the  towel. Repeat __________ times. Complete this exercise __________ times per day. STRENGTH - Ankle Inversion  Secure one end of a rubber exercise band/tubing to a fixed object (table, pole). Loop the other end around your foot just before your toes.  Place your fists between your knees. This will focus your strengthening at your ankle.  Slowly, pull your big toe up and in, making sure the band/tubing is positioned to resist the entire motion.  Hold this position for __________ seconds.  Have your muscles resist the band/tubing as it slowly pulls your foot back to the starting position. Repeat __________ times. Complete this exercises __________ times per day.  Document Released: 02/03/2005 Document Revised: 04/28/2011 Document Reviewed: 05/18/2008 [facility] Patient Information 2014 [place], [place].      Neta Mends. Panosh M.D.

## 2012-10-22 ENCOUNTER — Ambulatory Visit (INDEPENDENT_AMBULATORY_CARE_PROVIDER_SITE_OTHER)
Admission: RE | Admit: 2012-10-22 | Discharge: 2012-10-22 | Disposition: A | Discharge status: Home / Self Care | Payer: Medicare Other | Source: Ambulatory Visit | Attending: Internal Medicine | Admitting: Internal Medicine

## 2012-10-22 DIAGNOSIS — M79671 Pain in right foot: Secondary | ICD-10-CM

## 2012-10-22 DIAGNOSIS — M79609 Pain in unspecified limb: Secondary | ICD-10-CM | POA: Diagnosis not present

## 2012-12-13 ENCOUNTER — Encounter: Payer: Self-pay | Admitting: Internal Medicine

## 2012-12-15 ENCOUNTER — Encounter: Payer: Self-pay | Admitting: Internal Medicine

## 2012-12-17 ENCOUNTER — Ambulatory Visit (INDEPENDENT_AMBULATORY_CARE_PROVIDER_SITE_OTHER): Payer: Medicare Other | Admitting: Internal Medicine

## 2012-12-17 ENCOUNTER — Encounter: Payer: Self-pay | Admitting: Internal Medicine

## 2012-12-17 ENCOUNTER — Other Ambulatory Visit: Payer: Self-pay | Admitting: Family Medicine

## 2012-12-17 VITALS — BP 168/90 | HR 118 | Temp 98.8°F | Wt 158.0 lb

## 2012-12-17 DIAGNOSIS — R112 Nausea with vomiting, unspecified: Secondary | ICD-10-CM | POA: Diagnosis not present

## 2012-12-17 DIAGNOSIS — I10 Essential (primary) hypertension: Secondary | ICD-10-CM | POA: Diagnosis not present

## 2012-12-17 DIAGNOSIS — J22 Unspecified acute lower respiratory infection: Secondary | ICD-10-CM

## 2012-12-17 DIAGNOSIS — J988 Other specified respiratory disorders: Secondary | ICD-10-CM

## 2012-12-17 MED ORDER — HYDROCODONE-HOMATROPINE 5-1.5 MG/5ML PO SYRP: 5.0000 mL | ORAL_SOLUTION | ORAL | Status: DC | PRN

## 2012-12-17 MED ORDER — CEFDINIR 300 MG PO CAPS: 300.0000 mg | ORAL_CAPSULE | Freq: Two times a day (BID) | ORAL | Status: DC

## 2012-12-17 MED ORDER — LISINOPRIL-HYDROCHLOROTHIAZIDE 20-12.5 MG PO TABS: 1.0000 | ORAL_TABLET | Freq: Every day | ORAL | Status: DC

## 2012-12-17 NOTE — Progress Notes (Signed)
Chief Complaint  Patient presents with  . Follow-up    BP also sick resp illness    HPI: Patient comes in today for SDA for   problem evaluation. See e mail about bp running 140 ranges  Sometimes 150 and just advised to change to prinizide 20/12.5  This week  But  Onset  2 night ago with illness resp congestion  And "flood from head" nasal discharge   Vomited   Including bp medicine.  And bp reading was up.  No fever hoarse nad coughing  No cp sob  No fever.  Or low   Glands    and ears hurt.  coughin g hard today.  hasnt taken the prinizide yet.  Denies cough predating the illness ROS: See pertinent positives and negatives per HPI.  Past Medical History  Diagnosis Date  . Allergy   . GERD (gastroesophageal reflux disease)   . Headache(784.0)   . Hyperlipidemia   . Low back pain   . Rosacea   . Osteopenia   . 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 Primiparous     Family History  Problem Relation Age of Onset  . Atrial fibrillation Mother     had cv in her 42 s  . Cancer Sister     thryoid  . Fibromyalgia Brother   . Arthritis Mother     had staph infection knee replacment    History   Social History  . Marital Status: Married    Spouse Name: N/A    Number of Children: N/A  . Years of Education: N/A   Social History Main Topics  . Smoking status: Never Smoker   . Smokeless tobacco: None  . Alcohol Use: No  . Drug Use: No  . Sexual Activity: None   Other Topics Concern  . None   Social History Narrative   hh of 2   No pets   No ets.   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 cholecalciferol (VITAMIN D) 1000 UNITS tablet Take 1,000 Units by mouth daily.    Marland Kitchen co-enzyme Q-10 50 MG capsule Take 50 mg by mouth daily.    Marland Kitchen desloratadine  (CLARINEX) 5 MG tablet Take 5 mg by mouth daily.    . Guaifenesin (MUCINEX MAXIMUM STRENGTH) 1200 MG TB12 Take by mouth 2 (two) times daily.    . Multiple Vitamins-Minerals (CENTRUM SILVER PO) Take by mouth daily.    . promethazine (PHENERGAN) 12.5 MG suppository Place 1 suppository (12.5 mg total) rectally every 4 (four) hours as needed.  . simvastatin (ZOCOR) 20 MG tablet Take 1 tablet (20 mg total) by mouth at bedtime.  . Sulfacetamide Sodium, Acne, 10 % LOTN Apply 1 application topically 2 (two) times daily.  . cefdinir (OMNICEF) 300 MG capsule Take 1 capsule (300 mg total) by mouth 2 (two) times daily.  Marland Kitchen HYDROcodone-homatropine (HYCODAN) 5-1.5 MG/5ML syrup Take 5 mLs by mouth every 4 (four) hours as needed for cough.  Marland Kitchen lisinopril-hydrochlorothiazide (PRINZIDE,ZESTORETIC) 20-12.5 MG per tablet Take 1 tablet by mouth daily.  . [DISCONTINUED] lisinopril (PRINIVIL,ZESTRIL) 20 MG tablet Take 1 tablet (20 mg total) by mouth daily.    EXAM:  BP 168/90  Pulse 118  Temp(Src) 98.8 F (37.1 C) (Oral)  Wt 158 lb (71.668 kg)  BMI 26.48 kg/m2  SpO2 98%  Body mass index is 26.48 kg/(m^2). WDWN in NAD  quiet respirations; mildly congested  somewhat hoarse. Non toxic . moderatly ill alert very congested  At end of visit had dry heave vomiting    bp readings 168/90 and 170/100 HEENT: Normocephalic ;atraumatic , Eyes;  PERRL, EOMs  Full, lids and conjunctiva clear,,Ears: no deformities, canals nl, TM landmarks normal, clear fluid left  Nose: no deformity mucoid  discharge but congested;face  tender Mouth : OP clear without lesion or edema . Red op  Neck: Supple without adenopathy or masses or bruits Chest:  Clear to A&P without wheezes rales or rhonchi CV:  S1-S2 no gallops or murmurs peripheral perfusion is normal Abdomen:  Sof,t normal bowel sounds without hepatosplenomegaly, no guarding rebound or masses no CVA tenderness some lower abd tenderness Skin :nl perfusion and no acute rashes    ASSESSMENT AND PLAN:  Discussed the following assessment and plan:  Acute respiratory infection - some sinusitis sx   Unspecified essential hypertension  Nausea with vomiting bp up today prob from illknesbe keep with plan to change to pprinizide when some better .  Rest and fluids  For now contact us prn worsening.  Imphenrgan fluids at this time  -Patient advised to return or notify health care team  if symptoms worsen or persist or new concerns arise.  Patient Instructions  This acts like a viral resp infection that will resolved on its own . However if the sinus infection  Pain  Not improving  In the next few days can add antibiotic . Cough med for comfort at night . Clear liquids helpful You blood pressure  Is up today.    Can use  Afrin nose spray    For up to 3 days if needed .   Change blood pressure medication when  When you are starting to feel better.     Neta Mends. Panosh M.D.

## 2012-12-17 NOTE — Patient Instructions (Signed)
This acts like a viral resp infection that will resolved on its own . However if the sinus infection  Pain  Not improving  In the next few days can add antibiotic . Cough med for comfort at night . Clear liquids helpful You blood pressure  Is up today.    Can use  Afrin nose spray    For up to 3 days if needed .   Change blood pressure medication when  When you are starting to feel better.

## 2012-12-20 ENCOUNTER — Encounter: Payer: Self-pay | Admitting: Internal Medicine

## 2012-12-24 DIAGNOSIS — Z1231 Encounter for screening mammogram for malignant neoplasm of breast: Secondary | ICD-10-CM | POA: Diagnosis not present

## 2012-12-24 LAB — HM MAMMOGRAPHY

## 2013-01-04 ENCOUNTER — Encounter: Payer: Self-pay | Admitting: Internal Medicine

## 2013-01-25 ENCOUNTER — Encounter: Payer: Self-pay | Admitting: Family Medicine

## 2013-01-25 ENCOUNTER — Ambulatory Visit (INDEPENDENT_AMBULATORY_CARE_PROVIDER_SITE_OTHER): Payer: Medicare Other | Admitting: Family Medicine

## 2013-01-25 VITALS — BP 136/80 | HR 107 | Temp 99.5°F | Wt 160.0 lb

## 2013-01-25 DIAGNOSIS — J209 Acute bronchitis, unspecified: Secondary | ICD-10-CM | POA: Diagnosis not present

## 2013-01-25 MED ORDER — FLUCONAZOLE 150 MG PO TABS: 150.0000 mg | ORAL_TABLET | Freq: Once | ORAL | Status: DC

## 2013-01-25 MED ORDER — CEFDINIR 300 MG PO CAPS: 300.0000 mg | ORAL_CAPSULE | Freq: Two times a day (BID) | ORAL | Status: DC

## 2013-01-25 NOTE — Progress Notes (Signed)
Pre visit review using our clinic review tool, if applicable. No additional management support is needed unless otherwise documented below in the visit note. 

## 2013-01-25 NOTE — Progress Notes (Signed)
   Subjective:    Patient ID: Cheryl Robbins, female    DOB: 1945/03/19, 67 y.o.   MRN: [id number]  HPI Here for 5 days of sinus pressure, PND, chest congestion and coughing up yellow sputum. On Mucinex.    Review of Systems  Constitutional: Positive for fever.  HENT: Positive for congestion, postnasal drip and sinus pressure.   Eyes: Negative.   Respiratory: Positive for cough, chest tightness and wheezing.        Objective:   Physical Exam  Constitutional: She appears well-developed and well-nourished.  HENT:  Right Ear: External ear normal.  Left Ear: External ear normal.  Nose: Nose normal.  Mouth/Throat: Oropharynx is clear and moist.  Eyes: Conjunctivae are normal.  Pulmonary/Chest: Effort normal. No respiratory distress. She has no rales.  Scattered rhonchi and wheezes   Lymphadenopathy:    She has no cervical adenopathy.          Assessment & Plan:  Drink fluids. Recheck prn

## 2013-01-27 ENCOUNTER — Encounter: Payer: Self-pay | Admitting: Internal Medicine

## 2013-02-01 ENCOUNTER — Ambulatory Visit (AMBULATORY_SURGERY_CENTER): Payer: Self-pay

## 2013-02-01 VITALS — Ht 65.0 in | Wt 154.6 lb

## 2013-02-01 DIAGNOSIS — Z1211 Encounter for screening for malignant neoplasm of colon: Secondary | ICD-10-CM

## 2013-02-01 MED ORDER — MOVIPREP 100 G PO SOLR: ORAL | Status: DC

## 2013-02-01 NOTE — Telephone Encounter (Signed)
Stop the Cefdinir and call in a Zpack. She may use Imodium with this prn

## 2013-02-15 ENCOUNTER — Other Ambulatory Visit: Payer: Self-pay | Admitting: Internal Medicine

## 2013-02-25 ENCOUNTER — Ambulatory Visit (AMBULATORY_SURGERY_CENTER): Payer: Medicare Other | Admitting: Internal Medicine

## 2013-02-25 ENCOUNTER — Encounter: Payer: Self-pay | Admitting: Internal Medicine

## 2013-02-25 VITALS — BP 145/76 | HR 74 | Temp 97.3°F | Resp 11 | Ht 65.0 in | Wt 154.0 lb

## 2013-02-25 DIAGNOSIS — Z1211 Encounter for screening for malignant neoplasm of colon: Secondary | ICD-10-CM | POA: Diagnosis not present

## 2013-02-25 DIAGNOSIS — R109 Unspecified abdominal pain: Secondary | ICD-10-CM | POA: Diagnosis not present

## 2013-02-25 DIAGNOSIS — D126 Benign neoplasm of colon, unspecified: Secondary | ICD-10-CM

## 2013-02-25 DIAGNOSIS — G8929 Other chronic pain: Secondary | ICD-10-CM | POA: Diagnosis not present

## 2013-02-25 DIAGNOSIS — K219 Gastro-esophageal reflux disease without esophagitis: Secondary | ICD-10-CM | POA: Diagnosis not present

## 2013-02-25 DIAGNOSIS — G43909 Migraine, unspecified, not intractable, without status migrainosus: Secondary | ICD-10-CM | POA: Diagnosis not present

## 2013-02-25 DIAGNOSIS — I10 Essential (primary) hypertension: Secondary | ICD-10-CM | POA: Diagnosis not present

## 2013-02-25 MED ORDER — SODIUM CHLORIDE 0.9 % IV SOLN: 500.0000 mL | INTRAVENOUS | Status: DC

## 2013-02-25 NOTE — Progress Notes (Signed)
Report to pacu rn, vss, bbs=clear 

## 2013-02-25 NOTE — Progress Notes (Signed)
Called to room to assist during endoscopic procedure.  Patient ID and intended procedure confirmed with present staff. Received instructions for my participation in the procedure from the performing physician.  

## 2013-02-25 NOTE — Patient Instructions (Signed)
Discharge instructions given with verbal understanding. Handouts on polyps and diverticulosis. Resume previous medications. YOU HAD AN ENDOSCOPIC PROCEDURE TODAY AT THE [facility]: Refer to the procedure report that was given to you for any specific questions about what was found during the examination.  If the procedure report does not answer your questions, please call your gastroenterologist to clarify.  If you requested that your care partner not be given the details of your procedure findings, then the procedure report has been included in a sealed envelope for you to review at your convenience later.  YOU SHOULD EXPECT: Some feelings of bloating in the abdomen. Passage of more gas than usual.  Walking can help get rid of the air that was put into your GI tract during the procedure and reduce the bloating. If you had a lower endoscopy (such as a colonoscopy or flexible sigmoidoscopy) you may notice spotting of blood in your stool or on the toilet paper. If you underwent a bowel prep for your procedure, then you may not have a normal bowel movement for a few days.  DIET: Your first meal following the procedure should be a light meal and then it is ok to progress to your normal diet.  A half-sandwich or bowl of soup is an example of a good first meal.  Heavy or fried foods are harder to digest and may make you feel nauseous or bloated.  Likewise meals heavy in dairy and vegetables can cause extra gas to form and this can also increase the bloating.  Drink plenty of fluids but you should avoid alcoholic beverages for 24 hours.  ACTIVITY: Your care partner should take you home directly after the procedure.  You should plan to take it easy, moving slowly for the rest of the day.  You can resume normal activity the day after the procedure however you should NOT DRIVE or use heavy machinery for 24 hours (because of the sedation medicines used during the test).    SYMPTOMS TO REPORT  IMMEDIATELY: A gastroenterologist can be reached at any hour.  During normal business hours, 8:30 AM to 5:00 PM Monday through Friday, call [phone].  After hours and on weekends, please call the GI answering service at [phone] who will take a message and have the physician on call contact you.   Following lower endoscopy (colonoscopy or flexible sigmoidoscopy):  Excessive amounts of blood in the stool  Significant tenderness or worsening of abdominal pains  Swelling of the abdomen that is new, acute  Fever of 100F or higher  FOLLOW UP: If any biopsies were taken you will be contacted by phone or by letter within the next 1-3 weeks.  Call your gastroenterologist if you have not heard about the biopsies in 3 weeks.  Our staff will call the home number listed on your records the next business day following your procedure to check on you and address any questions or concerns that you may have at that time regarding the information given to you following your procedure. This is a courtesy call and so if there is no answer at the home number and we have not heard from you through the emergency physician on call, we will assume that you have returned to your regular daily activities without incident.  SIGNATURES/CONFIDENTIALITY: You and/or your care partner have signed paperwork which will be entered into your electronic medical record.  These signatures attest to the fact that that the information above on your After Visit Summary   has been reviewed and is understood.  Full responsibility of the confidentiality of this discharge information lies with you and/or your care-partner. 

## 2013-02-25 NOTE — Progress Notes (Signed)
NO EGG OR SOY ALLERGY. EMW PT STATES SHE HAS HARD TIME WAKING WITH SEDATION AND LAST COLON SHE VOMITED FOR 12 HOURS POST PROCEDURE. EWM

## 2013-02-25 NOTE — Op Note (Signed)
[facility]  [address] [place], [id number]   COLONOSCOPY PROCEDURE REPORT  PATIENT: Cheryl Robbins, Cheryl Robbins  MR#: [id number] BIRTHDATE: 1945-03-18 , 67  yrs. old GENDER: Female ENDOSCOPIST: Lafayette Dragon, MD REFERRED BW:IOMBT Darnelle Going, M.D. PROCEDURE DATE:  02/25/2013 PROCEDURE:   Colonoscopy with snare polypectomy First Screening Colonoscopy - Avg.  risk and is 50 yrs.  old or older - No.  Prior Negative Screening - Now for repeat screening. 10 or more years since last screening  History of Adenoma - Now for follow-up colonoscopy & has been > or = to 3 yrs.  N/A  Polyps Removed Today? Yes. ASA CLASS:   Class I INDICATIONS:Average risk patient for colon cancer. last colonoscopy January 2005 MEDICATIONS: MAC sedation, administered by CRNA and Propofol (Diprivan) 260 mg IV  DESCRIPTION OF PROCEDURE:   After the risks benefits and alternatives of the procedure were thoroughly explained, informed consent was obtained.  A digital rectal exam revealed no abnormalities of the rectum.   The LB [id number]  endoscope was introduced through the anus and advanced to the cecum, which was identified by both the appendix and ileocecal valve. No adverse events experienced.   The quality of the prep was good, using MoviPrep  The instrument was then slowly withdrawn as the colon was fully examined.      COLON FINDINGS: A semi-pedunculated polyp ranging between 5-103mm in size with a friable surface was found in the sigmoid colon.at 20 cm, difficult to see due to large folds and narrow lumen  A polypectomy was performed using snare cautery.  The resection was complete and the polyp tissue was completely retrieved.   There was moderate diverticulosis noted in the sigmoid colon with associated muscular hypertrophy and tortuosity.  Retroflexed views revealed no abnormalities. The time to cecum=7 minutes 3 seconds.  Withdrawal time=14 minutes 12 seconds.  The scope was  withdrawn and the procedure completed. COMPLICATIONS: There were no complications.  ENDOSCOPIC IMPRESSION: 1.   Semi-pedunculated polyp ranging between 5-46mm in size was found in the sigmoid colon; polypectomy was performed using snare cautery  2.   There was moderate diverticulosis noted in the sigmoid colon  RECOMMENDATIONS: 1.  Await pathology results 2.  high fiber diet Recall colonoscopy pending path report   eSigned:  Lafayette Dragon, MD 02/25/2013 10:28 AM   cc:   PATIENT NAME:  [name], [name] MR#: [id number]

## 2013-02-28 ENCOUNTER — Telehealth: Payer: Self-pay

## 2013-02-28 NOTE — Telephone Encounter (Signed)
  Follow up Call-  Call back number 02/25/2013  Post procedure Call Back phone  # [phone]  Permission to leave phone message Yes     Patient questions:  Do you have a fever, pain , or abdominal swelling? no Pain Score  0 *  Have you tolerated food without any problems? yes  Have you been able to return to your normal activities? yes  Do you have any questions about your discharge instructions: Diet   no Medications  no Follow up visit  no  Do you have questions or concerns about your Care? no  Actions: * If pain score is 4 or above: No action needed, pain <4.

## 2013-03-01 ENCOUNTER — Encounter: Payer: Self-pay | Admitting: Internal Medicine

## 2013-03-02 ENCOUNTER — Encounter: Payer: Self-pay | Admitting: *Deleted

## 2013-03-26 ENCOUNTER — Other Ambulatory Visit: Payer: Self-pay | Admitting: Internal Medicine

## 2013-03-28 NOTE — Telephone Encounter (Signed)
Not sure when the pt should return.  Due for lab work in March.  Please advise.  Thanks!

## 2013-03-29 ENCOUNTER — Telehealth: Payer: Self-pay | Admitting: Family Medicine

## 2013-03-29 NOTE — Telephone Encounter (Signed)
Refill x 3 months  Schedule yearly visit  Wellness March and will do  Labs then  Check her insurance to  Decide on appt type and lab appt type.

## 2013-03-29 NOTE — Telephone Encounter (Signed)
Per [facility], please contact the pt and find out what insurance she has and make yearly check up appt in March.  Please let me know if I need to add lab work. Thanks!

## 2013-03-30 NOTE — Telephone Encounter (Signed)
Pt has been sch for 06-24-13

## 2013-04-07 DIAGNOSIS — L259 Unspecified contact dermatitis, unspecified cause: Secondary | ICD-10-CM | POA: Diagnosis not present

## 2013-04-07 DIAGNOSIS — L219 Seborrheic dermatitis, unspecified: Secondary | ICD-10-CM | POA: Diagnosis not present

## 2013-04-07 DIAGNOSIS — L719 Rosacea, unspecified: Secondary | ICD-10-CM | POA: Diagnosis not present

## 2013-05-23 ENCOUNTER — Other Ambulatory Visit: Payer: Self-pay | Admitting: Internal Medicine

## 2013-06-03 ENCOUNTER — Ambulatory Visit (INDEPENDENT_AMBULATORY_CARE_PROVIDER_SITE_OTHER): Payer: Medicare Other | Admitting: Internal Medicine

## 2013-06-03 ENCOUNTER — Encounter: Payer: Self-pay | Admitting: Internal Medicine

## 2013-06-03 VITALS — BP 126/74 | HR 82 | Temp 98.5°F | Ht 65.0 in | Wt 161.0 lb

## 2013-06-03 DIAGNOSIS — J329 Chronic sinusitis, unspecified: Secondary | ICD-10-CM

## 2013-06-03 DIAGNOSIS — R05 Cough: Secondary | ICD-10-CM

## 2013-06-03 DIAGNOSIS — I10 Essential (primary) hypertension: Secondary | ICD-10-CM

## 2013-06-03 DIAGNOSIS — J309 Allergic rhinitis, unspecified: Secondary | ICD-10-CM

## 2013-06-03 DIAGNOSIS — E785 Hyperlipidemia, unspecified: Secondary | ICD-10-CM

## 2013-06-03 DIAGNOSIS — M949 Disorder of cartilage, unspecified: Secondary | ICD-10-CM

## 2013-06-03 DIAGNOSIS — M899 Disorder of bone, unspecified: Secondary | ICD-10-CM

## 2013-06-03 DIAGNOSIS — R059 Cough, unspecified: Secondary | ICD-10-CM | POA: Diagnosis not present

## 2013-06-03 DIAGNOSIS — R0982 Postnasal drip: Secondary | ICD-10-CM

## 2013-06-03 LAB — CBC WITH DIFFERENTIAL/PLATELET
BASOS ABS: 0 10*3/uL (ref 0.0–0.1)
Basophils Relative: 0.2 % (ref 0.0–3.0)
Eosinophils Absolute: 0.2 10*3/uL (ref 0.0–0.7)
Eosinophils Relative: 3.6 % (ref 0.0–5.0)
HEMATOCRIT: 42.4 % (ref 36.0–46.0)
Hemoglobin: 14.3 g/dL (ref 12.0–15.0)
LYMPHS PCT: 25 % (ref 12.0–46.0)
Lymphs Abs: 1.1 10*3/uL (ref 0.7–4.0)
MCHC: 33.7 g/dL (ref 30.0–36.0)
MCV: 88.5 fl (ref 78.0–100.0)
MONOS PCT: 9.1 % (ref 3.0–12.0)
Monocytes Absolute: 0.4 10*3/uL (ref 0.1–1.0)
Neutro Abs: 2.7 10*3/uL (ref 1.4–7.7)
Neutrophils Relative %: 62.1 % (ref 43.0–77.0)
PLATELETS: 184 10*3/uL (ref 150.0–400.0)
RBC: 4.79 Mil/uL (ref 3.87–5.11)
RDW: 13 % (ref 11.5–14.6)
WBC: 4.3 10*3/uL — AB (ref 4.5–10.5)

## 2013-06-03 LAB — BASIC METABOLIC PANEL
BUN: 15 mg/dL (ref 6–23)
CO2: 32 mEq/L (ref 19–32)
Calcium: 9.6 mg/dL (ref 8.4–10.5)
Chloride: 100 mEq/L (ref 96–112)
Creatinine, Ser: 0.8 mg/dL (ref 0.4–1.2)
GFR: 73.53 mL/min (ref >60.00)
Glucose, Bld: 81 mg/dL (ref 70–99)
Potassium: 4.6 mEq/L (ref 3.5–5.1)
SODIUM: 140 meq/L (ref 135–145)

## 2013-06-03 LAB — TSH: TSH: 1.77 u[IU]/mL (ref 0.35–5.50)

## 2013-06-03 LAB — HEPATIC FUNCTION PANEL
ALK PHOS: 39 U/L (ref 39–117)
ALT: 26 U/L (ref 0–35)
AST: 20 U/L (ref 0–37)
Albumin: 3.9 g/dL (ref 3.5–5.2)
BILIRUBIN DIRECT: 0.1 mg/dL (ref 0.0–0.3)
BILIRUBIN TOTAL: 0.6 mg/dL (ref 0.3–1.2)
Total Protein: 6.5 g/dL (ref 6.0–8.3)

## 2013-06-03 LAB — LIPID PANEL
CHOL/HDL RATIO: 3
Cholesterol: 128 mg/dL (ref 0–200)
HDL: 45.1 mg/dL (ref >39.00)
LDL CALC: 63 mg/dL (ref 0–99)
Triglycerides: 98 mg/dL (ref 0.0–149.0)
VLDL: 19.6 mg/dL (ref 0.0–40.0)

## 2013-06-03 MED ORDER — HYDROCODONE-HOMATROPINE 5-1.5 MG/5ML PO SYRP: 5.0000 mL | ORAL_SOLUTION | ORAL | Status: DC | PRN

## 2013-06-03 MED ORDER — PREDNISONE 20 MG PO TABS: ORAL_TABLET | ORAL | Status: DC

## 2013-06-03 MED ORDER — SIMVASTATIN 20 MG PO TABS: ORAL_TABLET | ORAL | Status: DC

## 2013-06-03 NOTE — Patient Instructions (Signed)
Advise increasing rx as if allergy sx . Stray on the flonase   And if not continuing  to improve in the next 5-7 days then add the prednisone that can help inflammation in the sinuses.  Ask allergy about the  Drug allergy ?s   Lab today   Keep wellness appt next week.

## 2013-06-03 NOTE — Progress Notes (Signed)
Chief Complaint  Patient presents with  . Cough    Started with a cold 4 weeks ago.  . Nasal Congestion  . Post Nasal Drip    HPI: Patient comes in today for SDA for problem evaluation. Cold got from husband and not wquitting  And excessive flow down throat  And some tho Hx of seasonal allergies   And has appt   Continuous no change   .  claritin and benadryl and mucinex .  Cough mid throat.  No sinus pain   ROS: See pertinent positives and negatives per HPI. No fever sweats   Not a lot of headaches  nbo nasal steroid.  Coughing out clear and white.  Ate tea  [place]  Almost out of simva mail order is delayed  Still sess dr Marcelline Mates about once a year Past Medical History  Diagnosis Date  . Allergy   . GERD (gastroesophageal reflux disease)   . Headache(784.0)   . Hyperlipidemia   . Low back pain   . Rosacea   . Osteopenia   . 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 Primiparous   . Hypertension     Family History  Problem Relation Age of Onset  . Atrial fibrillation Mother     had cv in her 64 s  . Arthritis Mother     had staph infection knee replacment  . Cancer Sister     thryoid  . Fibromyalgia Brother   . Colon cancer Neg Hx   . Rectal cancer Neg Hx   . Stomach cancer Neg Hx     History   Social History  . Marital Status: Married    Spouse Name: N/A    Number of Children: N/A  . Years of Education: N/A   Social History Main Topics  . Smoking status: Never Smoker   . Smokeless tobacco: Never Used  . Alcohol Use: No  . Drug Use: No  . Sexual Activity: None   Other Topics Concern  . None   Social History Narrative   hh of 2   No pets   No ets.   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 beta carotene  w/minerals (OCUVITE) tablet Take 1 tablet by mouth daily.  . cholecalciferol (VITAMIN D) 1000 UNITS tablet Take 1,000 Units by mouth daily.    Marland Kitchen co-enzyme Q-10 50 MG capsule Take 150 mg by mouth daily.   . diphenhydrAMINE (BENADRYL) 25 MG tablet Take 25 mg by mouth every 6 (six) hours as needed.  . fluticasone (CUTIVATE) 0.05 % cream Apply topically 2 (two) times daily.  . Guaifenesin (MUCINEX MAXIMUM STRENGTH) 1200 MG TB12 Take by mouth as needed.   Marland Kitchen HYDROcodone-homatropine (HYCODAN) 5-1.5 MG/5ML syrup Take 5 mLs by mouth every 4 (four) hours as needed for cough.  Marland Kitchen ibuprofen (ADVIL,MOTRIN) 200 MG tablet Take 200 mg by mouth as needed.  Marland Kitchen lisinopril-hydrochlorothiazide (PRINZIDE,ZESTORETIC) 20-12.5 MG per tablet take 1 tablet by mouth once daily  . loratadine (CLARITIN) 10 MG tablet Take 10 mg by mouth daily.  Marland Kitchen METRONIDAZOLE, TOPICAL, 0.75 % LOTN   . Multiple Vitamins-Minerals (CENTRUM SILVER PO) Take by mouth daily.    . promethazine (PHENERGAN) 12.5  MG suppository Place 1 suppository (12.5 mg total) rectally every 4 (four) hours as needed.  . simvastatin (ZOCOR) 20 MG tablet TAKE 1 TABLET AT BEDTIME  . Sulfacetamide Sodium, Acne, 10 % LOTN apply topically twice a day  . [DISCONTINUED] HYDROcodone-homatropine (HYCODAN) 5-1.5 MG/5ML syrup Take 5 mLs by mouth every 4 (four) hours as needed for cough.  . [DISCONTINUED] simvastatin (ZOCOR) 20 MG tablet TAKE 1 TABLET AT BEDTIME  . predniSONE (DELTASONE) 20 MG tablet Take 3 po qd for 2 days then 2 po qd for 3 days,or as directed    EXAM:  BP 126/74  Pulse 82  Temp(Src) 98.5 F (36.9 C) (Oral)  Ht 5\' 5"  (1.651 m)  Wt 161 lb (73.029 kg)  BMI 26.79 kg/m2  SpO2 97%  Body mass index is 26.79 kg/(m^2).  GENERAL: vitals reviewed and listed above, alert, oriented, appears well hydrated and in no acute distress congested bu well quiet resp no sig cough in exam HEENT: atraumatic, conjunctiva  clear, no obvious abnormalities on inspection of  external nose and ears congestd face non tender   OP : no lesion edema or exudate  Tracts  Drainage  NECK: no obvious masses on inspection palpation  LUNGS: clear to auscultation bilaterally, no wheezes, rales or rhonchi, good air movement CV: HRRR, no clubbing cyanosis or  peripheral edema nl cap refill  MS: moves all extremities without noticeable focal  abnormality PSYCH: pleasant and cooperative, no obvious depression or anxiety  ASSESSMENT AND PLAN:  Discussed the following assessment and plan:  Post-nasal drainage - Plan: simvastatin (ZOCOR) 20 MG tablet, Basic metabolic panel, CBC with Differential, Hepatic function panel, Lipid panel, TSH  Cough - consider acei adding  - Plan: simvastatin (ZOCOR) 20 MG tablet, Basic metabolic panel, CBC with Differential, Hepatic function panel, Lipid panel, TSH  HYPERTENSION - Plan: simvastatin (ZOCOR) 20 MG tablet, Basic metabolic panel, CBC with Differential, Hepatic function panel, Lipid panel, TSH  ALLERGIC RHINITIS - Plan: simvastatin (ZOCOR) 20 MG tablet, Basic metabolic panel, CBC with Differential, Hepatic function panel, Lipid panel, TSH  HYPERLIPIDEMIA - Plan: simvastatin (ZOCOR) 20 MG tablet, Basic metabolic panel, CBC with Differential, Hepatic function panel, Lipid panel, TSH  OSTEOPENIA - Plan: simvastatin (ZOCOR) 20 MG tablet, Basic metabolic panel, CBC with Differential, Hepatic function panel, Lipid panel, TSH Prolonged upper respiratory congestion postnasal drainage without obvious infection. Suspect somewhat allergic patient states it doesn't seem like a regular sinus infection anyway. If not significant improvement consider changing her ACE inhibitor to a different antihypertensive. Because she's here today we'll do lab tests at her to have her keep her followup appointment in early May. Discussed risk benefit of prednisone if needed. Ok to refill cough med if needed . -Patient advised to return or notify health care team  if  symptoms worsen ,persist or new concerns arise.  Patient Instructions  Advise increasing rx as if allergy sx . Stray on the flonase   And if not continuing  to improve in the next 5-7 days then add the prednisone that can help inflammation in the sinuses.  Ask allergy about the  Drug allergy ?s   Lab today   Keep wellness appt next week.   Standley Brooking. Panosh M.D.   Pre visit review using our clinic review tool, if applicable. No additional management support is needed unless otherwise documented below in the visit note.

## 2013-06-06 ENCOUNTER — Telehealth: Payer: Self-pay | Admitting: Internal Medicine

## 2013-06-06 NOTE — Telephone Encounter (Signed)
Relevant patient education assigned to patient using Emmi. ° °

## 2013-06-23 DIAGNOSIS — R059 Cough, unspecified: Secondary | ICD-10-CM | POA: Diagnosis not present

## 2013-06-23 DIAGNOSIS — J3081 Allergic rhinitis due to animal (cat) (dog) hair and dander: Secondary | ICD-10-CM | POA: Diagnosis not present

## 2013-06-23 DIAGNOSIS — R05 Cough: Secondary | ICD-10-CM | POA: Diagnosis not present

## 2013-06-23 DIAGNOSIS — J301 Allergic rhinitis due to pollen: Secondary | ICD-10-CM | POA: Diagnosis not present

## 2013-06-23 DIAGNOSIS — J3089 Other allergic rhinitis: Secondary | ICD-10-CM | POA: Diagnosis not present

## 2013-06-24 ENCOUNTER — Encounter: Payer: Self-pay | Admitting: Internal Medicine

## 2013-06-24 ENCOUNTER — Ambulatory Visit (INDEPENDENT_AMBULATORY_CARE_PROVIDER_SITE_OTHER): Payer: Medicare Other | Admitting: Internal Medicine

## 2013-06-24 VITALS — BP 150/90 | HR 85 | Temp 98.6°F | Ht 65.0 in | Wt 163.0 lb

## 2013-06-24 DIAGNOSIS — R053 Chronic cough: Secondary | ICD-10-CM

## 2013-06-24 DIAGNOSIS — R059 Cough, unspecified: Secondary | ICD-10-CM | POA: Diagnosis not present

## 2013-06-24 DIAGNOSIS — M949 Disorder of cartilage, unspecified: Secondary | ICD-10-CM

## 2013-06-24 DIAGNOSIS — J309 Allergic rhinitis, unspecified: Secondary | ICD-10-CM

## 2013-06-24 DIAGNOSIS — R05 Cough: Secondary | ICD-10-CM | POA: Diagnosis not present

## 2013-06-24 DIAGNOSIS — Z0000 Encounter for general adult medical examination without abnormal findings: Secondary | ICD-10-CM

## 2013-06-24 DIAGNOSIS — I10 Essential (primary) hypertension: Secondary | ICD-10-CM | POA: Diagnosis not present

## 2013-06-24 DIAGNOSIS — Z8612 Personal history of poliomyelitis: Secondary | ICD-10-CM

## 2013-06-24 DIAGNOSIS — E785 Hyperlipidemia, unspecified: Secondary | ICD-10-CM | POA: Diagnosis not present

## 2013-06-24 DIAGNOSIS — M899 Disorder of bone, unspecified: Secondary | ICD-10-CM

## 2013-06-24 DIAGNOSIS — Z23 Encounter for immunization: Secondary | ICD-10-CM | POA: Diagnosis not present

## 2013-06-24 MED ORDER — LISINOPRIL-HYDROCHLOROTHIAZIDE 20-12.5 MG PO TABS: ORAL_TABLET | ORAL | Status: DC

## 2013-06-24 NOTE — Patient Instructions (Addendum)
Continue lifestyle intervention healthy eating and exercise . 150 minutes of exercise weeks  ,   weight  To healthy levels. Avoid trans fats and processed foods;  Increase fresh fruits and veges to 5 servings per day. And avoid sweet beverages  Including tea and juice. Consider changing the blood pressure medicine because could aggravate. Cough  . Contact us after allergy rx and we can change medication  If needed  .and follow up at that time if needed    Make sure blood pressure readings are at goal. If doing well  Wellness visit in 12 months

## 2013-06-24 NOTE — Progress Notes (Signed)
Chief Complaint  Patient presents with  . Medicare Wellness  . Hypertension  . Allergic Rhinitis     HPI: Patient comes in today for Preventive Medicare wellness visit . No major injuries, ed visits ,hospitalizations , since last visit. Wherever she has had an allergy evaluation. Has begun on singulair and asteline  Had allergy shots  advised to her remote history of receiving these in the office she had pain Steroid shot  Dr Orvil Feil  ; last night was first time she wasn't coughing . Advised  Shots   None x 6 years.  She is hopeful she can improve with her 6 month of symptoms. Denies chest pain shortness of breath her blood pressure readings are generally in range with an occasional 150 and above.  Health Maintenance  Topic Date Due  . Influenza Vaccine  09/17/2013  . Mammogram  12/25/2014  . Tetanus/tdap  07/29/2016  . Colonoscopy  02/26/2023  . Pneumococcal Polysaccharide Vaccine Age 55 And Over  Completed  . Zostavax  Completed   Health Maintenance Review   Hearing:   Ok   Vision:  No limitations at present . Last eye check UTD  Safety:  Has smoke detector and wears seat belts.  No firearms. No excess sun exposure. Sees dentist regularly.  Falls: no  Advance directive :  Reviewed  Has one. Can get this to record   Memory: Felt to be good  , no concern from her or her family.  Depression: No anhedonia unusual crying or depressive symptoms  Nutrition: Eats well balanced diet; adequate calcium and vitamin D. No swallowing chewing problems.  Injury: no major injuries in the last six months.  Other healthcare providers:  Reviewed today .  Social:  Lives with spouse married. No pets.   Preventive parameters: up-to-date  Reviewed   ADLS:   There are no problems or need for assistance  driving, feeding, obtaining food, dressing, toileting and bathing, managing money using phone. She is independent.  EXERCISE/ HABITS  Per week  In paast walking  On hold  For now  No  tobacco    none etoh   ROS:  GEN/ HEENT: No fever, significant weight changes sweats headaches vision problems hearing changes, CV/ PULM; No chest pain shortness of breath cough, syncope,edema  change in exercise tolerance. GI /GU: No adominal pain, vomiting, change in bowel habits. No blood in the stool. No significant GU symptoms. SKIN/HEME: ,no acute skin rashes suspicious lesions or bleeding. No lymphadenopathy, nodules, masses.  NEURO/ PSYCH:  No neurologic signs such as weakness numbness. No depression anxiety. IMM/ Allergy: No unusual infections.  Allergy .   REST of 12 system review negative except as per HPI   Past Medical History  Diagnosis Date  . Allergy   . GERD (gastroesophageal reflux disease)   . Headache(784.0)   . Hyperlipidemia   . Low back pain   . Rosacea   . Osteopenia   . 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 Primiparous   . Hypertension    Past Surgical History  Procedure Laterality Date  . Oophorectomy    . 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 Tonsilletomy  1953  . Atypical moles    . Colonoscopy       Family History  Problem Relation Age of Onset  . Atrial fibrillation Mother  had cv in her 43 s  . Arthritis Mother     had staph infection knee replacment  . Cancer Sister     thryoid  . Fibromyalgia Brother   . Colon cancer Neg Hx   . Rectal cancer Neg Hx   . Stomach cancer Neg Hx     History   Social History  . Marital Status: Married    Spouse Name: N/A    Number of Children: N/A  . Years of Education: N/A   Social History Main Topics  . Smoking status: Never Smoker   . Smokeless tobacco: Never Used  . Alcohol Use: No  . Drug Use: No  . Sexual Activity: None   Other Topics Concern  . None   Social History Narrative   hh of 2   No pets   No ets.   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 beta carotene w/minerals (OCUVITE) tablet Take 1 tablet by mouth daily.  . Calcium Carbonate-Vitamin D (CALTRATE 600+D) 600-400 MG-UNIT per tablet Take 1 tablet by mouth daily.  Marland Kitchen co-enzyme Q-10 50 MG capsule Take 150 mg by mouth daily.   . diphenhydrAMINE (BENADRYL) 25 MG tablet Take 25 mg by mouth every 6 (six) hours as needed.  . fluticasone (CUTIVATE) 0.05 % cream Apply topically 2 (two) times daily.  Marland Kitchen guaiFENesin (MUCINEX) 600 MG 12 hr tablet Take by mouth 2 (two) times daily.  Marland Kitchen HYDROcodone-homatropine (HYCODAN) 5-1.5 MG/5ML syrup Take 5 mLs by mouth every 4 (four) hours as needed for cough.  Marland Kitchen ibuprofen (ADVIL,MOTRIN) 200 MG tablet Take 200 mg by mouth as needed.  Marland Kitchen lisinopril-hydrochlorothiazide (PRINZIDE,ZESTORETIC) 20-12.5 MG per tablet take 1 tablet by mouth once daily  . loratadine (CLARITIN) 10 MG tablet Take 10 mg by mouth daily.  Marland Kitchen METRONIDAZOLE, TOPICAL, 0.75 % LOTN   . montelukast (SINGULAIR) 10 MG tablet   . Multiple Vitamins-Minerals (CENTRUM SILVER PO) Take by mouth daily.    . simvastatin (ZOCOR) 20 MG tablet TAKE 1 TABLET AT BEDTIME  . [DISCONTINUED] lisinopril-hydrochlorothiazide (PRINZIDE,ZESTORETIC) 20-12.5 MG per tablet take 1 tablet by mouth once daily  . azelastine (ASTELIN) 0.1 % nasal spray   . [DISCONTINUED] cholecalciferol (VITAMIN D) 1000 UNITS tablet Take 1,000 Units by mouth daily.    . [DISCONTINUED] Guaifenesin (MUCINEX MAXIMUM STRENGTH) 1200 MG TB12 Take by mouth as needed.   . [DISCONTINUED] predniSONE (DELTASONE) 20 MG tablet Take 3 po qd for 2 days then 2 po qd for 3 days,or as directed  . [DISCONTINUED] promethazine (PHENERGAN) 12.5 MG suppository Place 1 suppository (12.5 mg total) rectally every 4 (four) hours as needed.  . [DISCONTINUED] Sulfacetamide Sodium, Acne, 10 % LOTN apply topically twice  a day    EXAM:  BP 150/90  Pulse 85  Temp(Src) 98.6 F (37 C) (Oral)  Ht 5\' 5"  (1.651 m)  Wt 163 lb (73.936 kg)  BMI 27.12 kg/m2  SpO2 98%  Body mass index is 27.12 kg/(m^2).  Physical Exam: Vital signs reviewed [id number] is a well-developed well-nourished alert cooperative   who appears stated age in no acute distress.  Congested but well  HEENT: normocephalic atraumatic , Eyes: PERRL EOM's full, conjunctiva clear, Nares: paten,t no deformity discharge or tenderness.congested , Ears: no deformity EAC's clear TMs  with normal landmarks. Mouth: clear OP, no lesions, edema.  Moist mucous membranes. Dentition in adequate repair. NECK: supple without masses, thyromegaly or bruits. CHEST/PULM:  Clear to auscultation and percussion breath sounds equal no wheeze , rales or rhonchi. No chest wall deformities or tenderness. Breast: normal by inspection . No dimpling, discharge, masses, tenderness or discharge . CV: PMI is nondisplaced, S1 S2 no gallops, murmurs, rubs. Peripheral pulses are full without delay.No JVD .  ABDOMEN: Bowel sounds normal nontender  No guard or rebound, no hepato splenomegal no CVA tenderness.  No hernia. Extremtities:  No clubbing cyanosis or edema, no acute joint swelling or redness  NEURO:  Oriented x3, cranial nerves 3-12 appear to be intact, gait within  Grossly normal limits SKIN: No acute rashes normal turgor, color, no bruising  Some le  Pigment changes but no edema PSYCH: Oriented, good eye contact, no obvious depression anxiety, cognition and judgment appear normal. LN: no cervical axillary inguinal adenopathy No noted deficits in memory, attention, and speech.   Lab Results  Component Value Date   WBC 4.3* 06/03/2013   HGB 14.3 06/03/2013   HCT 42.4 06/03/2013   PLT 184.0 06/03/2013   GLUCOSE 81 06/03/2013   CHOL 128 06/03/2013   TRIG 98.0 06/03/2013   HDL 45.10 06/03/2013   LDLDIRECT 171.0 06/17/2006   LDLCALC 63 06/03/2013   ALT 26 06/03/2013   AST 20  06/03/2013   NA 140 06/03/2013   K 4.6 06/03/2013   CL 100 06/03/2013   CREATININE 0.8 06/03/2013   BUN 15 06/03/2013   CO2 32 06/03/2013   TSH 1.77 06/03/2013    ASSESSMENT AND PLAN:  Discussed the following assessment and plan:  Medicare annual wellness visit, subsequent  HYPERLIPIDEMIA  HYPERTENSION  ALLERGIC RHINITIS - persistent  under eval poss allergy shots  Need for vaccination with 13-polyvalent pneumococcal conjugate vaccine - Plan: Pneumococcal conjugate vaccine 13-valent  Cough, persistent - allergic and better with roids but consider acei aggrator if not better in enxt months call and change bp med and then fu as appropriate  OSTEOPENIA  POLIOMYELITIS, HX OF  Patient Care Team: Burnis Medin, MD as PCP - General Thornell Sartorius, MD (Otolaryngology) Michelene Gardener., MD (Dermatology) Tobi Bastos, MD (Orthopedic Surgery) ed Eddie Dibbles (Ophthalmology) Tiajuana Amass, MD as Consulting Physician (Allergy and Immunology)  Patient Instructions  Continue lifestyle intervention healthy eating and exercise . 150 minutes of exercise weeks  ,   weight  To healthy levels. Avoid trans fats and processed foods;  Increase fresh fruits and veges to 5 servings per day. And avoid sweet beverages  Including tea and juice. Consider changing the blood pressure medicine because could aggravate. Cough  . Contact us after allergy rx and we can change medication  If needed  .and follow up at that time if needed    Make sure blood pressure readings are at goal. If doing well  Wellness visit in 12 months    Clarie Camey K. Jazper Nikolai M.D.  Pre visit review using our clinic review tool, if applicable. No additional management support is needed unless otherwise documented below in the visit note.

## 2013-06-24 NOTE — Assessment & Plan Note (Signed)
[  proceed with Dr Orvil Feil care plan

## 2013-06-27 ENCOUNTER — Ambulatory Visit
Admission: RE | Admit: 2013-06-27 | Discharge: 2013-06-27 | Disposition: A | Discharge status: Home / Self Care | Payer: Medicare Other | Source: Ambulatory Visit | Attending: Allergy and Immunology | Admitting: Allergy and Immunology

## 2013-06-27 ENCOUNTER — Other Ambulatory Visit: Payer: Self-pay | Admitting: Allergy and Immunology

## 2013-06-27 DIAGNOSIS — R059 Cough, unspecified: Secondary | ICD-10-CM

## 2013-06-27 DIAGNOSIS — R05 Cough: Secondary | ICD-10-CM | POA: Diagnosis not present

## 2013-07-26 DIAGNOSIS — J309 Allergic rhinitis, unspecified: Secondary | ICD-10-CM | POA: Diagnosis not present

## 2013-07-28 DIAGNOSIS — J309 Allergic rhinitis, unspecified: Secondary | ICD-10-CM | POA: Diagnosis not present

## 2013-08-03 DIAGNOSIS — J309 Allergic rhinitis, unspecified: Secondary | ICD-10-CM | POA: Diagnosis not present

## 2013-08-05 DIAGNOSIS — J309 Allergic rhinitis, unspecified: Secondary | ICD-10-CM | POA: Diagnosis not present

## 2013-08-06 ENCOUNTER — Encounter: Payer: Self-pay | Admitting: Internal Medicine

## 2013-08-06 ENCOUNTER — Other Ambulatory Visit: Payer: Self-pay | Admitting: Internal Medicine

## 2013-08-08 ENCOUNTER — Telehealth: Payer: Self-pay | Admitting: Family Medicine

## 2013-08-08 ENCOUNTER — Other Ambulatory Visit: Payer: Self-pay | Admitting: Internal Medicine

## 2013-08-08 DIAGNOSIS — J309 Allergic rhinitis, unspecified: Secondary | ICD-10-CM | POA: Diagnosis not present

## 2013-08-08 NOTE — Telephone Encounter (Signed)
Sent to the pharmacy by e-scribe. 

## 2013-08-08 NOTE — Telephone Encounter (Signed)
Error

## 2013-08-09 NOTE — Telephone Encounter (Signed)
Resent for one year.

## 2013-08-15 DIAGNOSIS — J309 Allergic rhinitis, unspecified: Secondary | ICD-10-CM | POA: Diagnosis not present

## 2013-08-17 DIAGNOSIS — J309 Allergic rhinitis, unspecified: Secondary | ICD-10-CM | POA: Diagnosis not present

## 2013-08-24 DIAGNOSIS — J309 Allergic rhinitis, unspecified: Secondary | ICD-10-CM | POA: Diagnosis not present

## 2013-08-26 DIAGNOSIS — J309 Allergic rhinitis, unspecified: Secondary | ICD-10-CM | POA: Diagnosis not present

## 2013-08-29 DIAGNOSIS — J309 Allergic rhinitis, unspecified: Secondary | ICD-10-CM | POA: Diagnosis not present

## 2013-09-02 DIAGNOSIS — J309 Allergic rhinitis, unspecified: Secondary | ICD-10-CM | POA: Diagnosis not present

## 2013-09-05 DIAGNOSIS — J309 Allergic rhinitis, unspecified: Secondary | ICD-10-CM | POA: Diagnosis not present

## 2013-09-07 DIAGNOSIS — T6391XA Toxic effect of contact with unspecified venomous animal, accidental (unintentional), initial encounter: Secondary | ICD-10-CM | POA: Diagnosis not present

## 2013-09-12 DIAGNOSIS — J309 Allergic rhinitis, unspecified: Secondary | ICD-10-CM | POA: Diagnosis not present

## 2013-09-19 DIAGNOSIS — J309 Allergic rhinitis, unspecified: Secondary | ICD-10-CM | POA: Diagnosis not present

## 2013-09-23 DIAGNOSIS — J309 Allergic rhinitis, unspecified: Secondary | ICD-10-CM | POA: Diagnosis not present

## 2013-09-27 DIAGNOSIS — J309 Allergic rhinitis, unspecified: Secondary | ICD-10-CM | POA: Diagnosis not present

## 2013-09-30 DIAGNOSIS — J309 Allergic rhinitis, unspecified: Secondary | ICD-10-CM | POA: Diagnosis not present

## 2013-10-04 DIAGNOSIS — H353190 Nonexudative age-related macular degeneration, unspecified eye, stage unspecified: Secondary | ICD-10-CM | POA: Diagnosis not present

## 2013-10-14 ENCOUNTER — Other Ambulatory Visit: Payer: Self-pay | Admitting: Internal Medicine

## 2013-10-14 DIAGNOSIS — J309 Allergic rhinitis, unspecified: Secondary | ICD-10-CM | POA: Diagnosis not present

## 2013-10-14 NOTE — Telephone Encounter (Signed)
Sent to the pharmacy by e-scribe. 

## 2013-10-28 DIAGNOSIS — J309 Allergic rhinitis, unspecified: Secondary | ICD-10-CM | POA: Diagnosis not present

## 2013-11-11 DIAGNOSIS — J309 Allergic rhinitis, unspecified: Secondary | ICD-10-CM | POA: Diagnosis not present

## 2013-11-14 DIAGNOSIS — J309 Allergic rhinitis, unspecified: Secondary | ICD-10-CM | POA: Diagnosis not present

## 2013-11-17 DIAGNOSIS — J3089 Other allergic rhinitis: Secondary | ICD-10-CM | POA: Diagnosis not present

## 2013-11-17 DIAGNOSIS — J3081 Allergic rhinitis due to animal (cat) (dog) hair and dander: Secondary | ICD-10-CM | POA: Diagnosis not present

## 2013-11-17 DIAGNOSIS — J301 Allergic rhinitis due to pollen: Secondary | ICD-10-CM | POA: Diagnosis not present

## 2013-11-25 DIAGNOSIS — J3081 Allergic rhinitis due to animal (cat) (dog) hair and dander: Secondary | ICD-10-CM | POA: Diagnosis not present

## 2013-11-25 DIAGNOSIS — J301 Allergic rhinitis due to pollen: Secondary | ICD-10-CM | POA: Diagnosis not present

## 2013-11-25 DIAGNOSIS — J3089 Other allergic rhinitis: Secondary | ICD-10-CM | POA: Diagnosis not present

## 2013-11-28 DIAGNOSIS — J3089 Other allergic rhinitis: Secondary | ICD-10-CM | POA: Diagnosis not present

## 2013-11-28 DIAGNOSIS — J3081 Allergic rhinitis due to animal (cat) (dog) hair and dander: Secondary | ICD-10-CM | POA: Diagnosis not present

## 2013-11-28 DIAGNOSIS — J301 Allergic rhinitis due to pollen: Secondary | ICD-10-CM | POA: Diagnosis not present

## 2013-12-01 DIAGNOSIS — J3081 Allergic rhinitis due to animal (cat) (dog) hair and dander: Secondary | ICD-10-CM | POA: Diagnosis not present

## 2013-12-01 DIAGNOSIS — J3089 Other allergic rhinitis: Secondary | ICD-10-CM | POA: Diagnosis not present

## 2013-12-01 DIAGNOSIS — J301 Allergic rhinitis due to pollen: Secondary | ICD-10-CM | POA: Diagnosis not present

## 2013-12-05 DIAGNOSIS — J301 Allergic rhinitis due to pollen: Secondary | ICD-10-CM | POA: Diagnosis not present

## 2013-12-05 DIAGNOSIS — J3089 Other allergic rhinitis: Secondary | ICD-10-CM | POA: Diagnosis not present

## 2013-12-05 DIAGNOSIS — J3081 Allergic rhinitis due to animal (cat) (dog) hair and dander: Secondary | ICD-10-CM | POA: Diagnosis not present

## 2013-12-06 ENCOUNTER — Ambulatory Visit (INDEPENDENT_AMBULATORY_CARE_PROVIDER_SITE_OTHER): Payer: Medicare Other

## 2013-12-06 DIAGNOSIS — Z23 Encounter for immunization: Secondary | ICD-10-CM

## 2013-12-15 DIAGNOSIS — H25813 Combined forms of age-related cataract, bilateral: Secondary | ICD-10-CM | POA: Diagnosis not present

## 2013-12-28 DIAGNOSIS — J301 Allergic rhinitis due to pollen: Secondary | ICD-10-CM | POA: Diagnosis not present

## 2013-12-28 DIAGNOSIS — J3081 Allergic rhinitis due to animal (cat) (dog) hair and dander: Secondary | ICD-10-CM | POA: Diagnosis not present

## 2013-12-28 DIAGNOSIS — R05 Cough: Secondary | ICD-10-CM | POA: Diagnosis not present

## 2013-12-28 DIAGNOSIS — J3089 Other allergic rhinitis: Secondary | ICD-10-CM | POA: Diagnosis not present

## 2013-12-29 DIAGNOSIS — Z1231 Encounter for screening mammogram for malignant neoplasm of breast: Secondary | ICD-10-CM | POA: Diagnosis not present

## 2013-12-29 DIAGNOSIS — J301 Allergic rhinitis due to pollen: Secondary | ICD-10-CM | POA: Diagnosis not present

## 2013-12-29 DIAGNOSIS — J3089 Other allergic rhinitis: Secondary | ICD-10-CM | POA: Diagnosis not present

## 2013-12-29 DIAGNOSIS — J3081 Allergic rhinitis due to animal (cat) (dog) hair and dander: Secondary | ICD-10-CM | POA: Diagnosis not present

## 2013-12-30 ENCOUNTER — Other Ambulatory Visit: Payer: Self-pay | Admitting: Family Medicine

## 2014-01-02 DIAGNOSIS — J3089 Other allergic rhinitis: Secondary | ICD-10-CM | POA: Diagnosis not present

## 2014-01-02 DIAGNOSIS — J301 Allergic rhinitis due to pollen: Secondary | ICD-10-CM | POA: Diagnosis not present

## 2014-01-02 DIAGNOSIS — J3081 Allergic rhinitis due to animal (cat) (dog) hair and dander: Secondary | ICD-10-CM | POA: Diagnosis not present

## 2014-01-04 DIAGNOSIS — J3089 Other allergic rhinitis: Secondary | ICD-10-CM | POA: Diagnosis not present

## 2014-01-04 DIAGNOSIS — J301 Allergic rhinitis due to pollen: Secondary | ICD-10-CM | POA: Diagnosis not present

## 2014-01-04 DIAGNOSIS — J3081 Allergic rhinitis due to animal (cat) (dog) hair and dander: Secondary | ICD-10-CM | POA: Diagnosis not present

## 2014-01-10 DIAGNOSIS — J301 Allergic rhinitis due to pollen: Secondary | ICD-10-CM | POA: Diagnosis not present

## 2014-01-10 DIAGNOSIS — J3081 Allergic rhinitis due to animal (cat) (dog) hair and dander: Secondary | ICD-10-CM | POA: Diagnosis not present

## 2014-01-10 DIAGNOSIS — J3089 Other allergic rhinitis: Secondary | ICD-10-CM | POA: Diagnosis not present

## 2014-01-16 DIAGNOSIS — J3089 Other allergic rhinitis: Secondary | ICD-10-CM | POA: Diagnosis not present

## 2014-01-16 DIAGNOSIS — J301 Allergic rhinitis due to pollen: Secondary | ICD-10-CM | POA: Diagnosis not present

## 2014-01-16 DIAGNOSIS — J3081 Allergic rhinitis due to animal (cat) (dog) hair and dander: Secondary | ICD-10-CM | POA: Diagnosis not present

## 2014-01-20 DIAGNOSIS — J3081 Allergic rhinitis due to animal (cat) (dog) hair and dander: Secondary | ICD-10-CM | POA: Diagnosis not present

## 2014-01-20 DIAGNOSIS — J3089 Other allergic rhinitis: Secondary | ICD-10-CM | POA: Diagnosis not present

## 2014-01-20 DIAGNOSIS — J301 Allergic rhinitis due to pollen: Secondary | ICD-10-CM | POA: Diagnosis not present

## 2014-01-23 DIAGNOSIS — J301 Allergic rhinitis due to pollen: Secondary | ICD-10-CM | POA: Diagnosis not present

## 2014-01-23 DIAGNOSIS — J3081 Allergic rhinitis due to animal (cat) (dog) hair and dander: Secondary | ICD-10-CM | POA: Diagnosis not present

## 2014-01-23 DIAGNOSIS — J3089 Other allergic rhinitis: Secondary | ICD-10-CM | POA: Diagnosis not present

## 2014-01-27 DIAGNOSIS — J301 Allergic rhinitis due to pollen: Secondary | ICD-10-CM | POA: Diagnosis not present

## 2014-01-27 DIAGNOSIS — J3089 Other allergic rhinitis: Secondary | ICD-10-CM | POA: Diagnosis not present

## 2014-01-31 DIAGNOSIS — L718 Other rosacea: Secondary | ICD-10-CM | POA: Diagnosis not present

## 2014-01-31 DIAGNOSIS — L219 Seborrheic dermatitis, unspecified: Secondary | ICD-10-CM | POA: Diagnosis not present

## 2014-01-31 DIAGNOSIS — Z1283 Encounter for screening for malignant neoplasm of skin: Secondary | ICD-10-CM | POA: Diagnosis not present

## 2014-02-01 DIAGNOSIS — J301 Allergic rhinitis due to pollen: Secondary | ICD-10-CM | POA: Diagnosis not present

## 2014-02-01 DIAGNOSIS — J3081 Allergic rhinitis due to animal (cat) (dog) hair and dander: Secondary | ICD-10-CM | POA: Diagnosis not present

## 2014-02-01 DIAGNOSIS — J3089 Other allergic rhinitis: Secondary | ICD-10-CM | POA: Diagnosis not present

## 2014-02-03 DIAGNOSIS — J301 Allergic rhinitis due to pollen: Secondary | ICD-10-CM | POA: Diagnosis not present

## 2014-02-03 DIAGNOSIS — J3081 Allergic rhinitis due to animal (cat) (dog) hair and dander: Secondary | ICD-10-CM | POA: Diagnosis not present

## 2014-02-03 DIAGNOSIS — J3089 Other allergic rhinitis: Secondary | ICD-10-CM | POA: Diagnosis not present

## 2014-02-07 DIAGNOSIS — J3089 Other allergic rhinitis: Secondary | ICD-10-CM | POA: Diagnosis not present

## 2014-02-07 DIAGNOSIS — J3081 Allergic rhinitis due to animal (cat) (dog) hair and dander: Secondary | ICD-10-CM | POA: Diagnosis not present

## 2014-02-07 DIAGNOSIS — J301 Allergic rhinitis due to pollen: Secondary | ICD-10-CM | POA: Diagnosis not present

## 2014-02-09 DIAGNOSIS — J3089 Other allergic rhinitis: Secondary | ICD-10-CM | POA: Diagnosis not present

## 2014-02-13 DIAGNOSIS — J301 Allergic rhinitis due to pollen: Secondary | ICD-10-CM | POA: Diagnosis not present

## 2014-02-13 DIAGNOSIS — J3089 Other allergic rhinitis: Secondary | ICD-10-CM | POA: Diagnosis not present

## 2014-02-13 DIAGNOSIS — J3081 Allergic rhinitis due to animal (cat) (dog) hair and dander: Secondary | ICD-10-CM | POA: Diagnosis not present

## 2014-02-24 DIAGNOSIS — J3089 Other allergic rhinitis: Secondary | ICD-10-CM | POA: Diagnosis not present

## 2014-02-24 DIAGNOSIS — J3081 Allergic rhinitis due to animal (cat) (dog) hair and dander: Secondary | ICD-10-CM | POA: Diagnosis not present

## 2014-02-24 DIAGNOSIS — J301 Allergic rhinitis due to pollen: Secondary | ICD-10-CM | POA: Diagnosis not present

## 2014-03-03 DIAGNOSIS — J3089 Other allergic rhinitis: Secondary | ICD-10-CM | POA: Diagnosis not present

## 2014-03-09 DIAGNOSIS — J3081 Allergic rhinitis due to animal (cat) (dog) hair and dander: Secondary | ICD-10-CM | POA: Diagnosis not present

## 2014-03-09 DIAGNOSIS — J301 Allergic rhinitis due to pollen: Secondary | ICD-10-CM | POA: Diagnosis not present

## 2014-03-09 DIAGNOSIS — J3089 Other allergic rhinitis: Secondary | ICD-10-CM | POA: Diagnosis not present

## 2014-03-17 DIAGNOSIS — J3089 Other allergic rhinitis: Secondary | ICD-10-CM | POA: Diagnosis not present

## 2014-03-17 DIAGNOSIS — J3081 Allergic rhinitis due to animal (cat) (dog) hair and dander: Secondary | ICD-10-CM | POA: Diagnosis not present

## 2014-03-17 DIAGNOSIS — J301 Allergic rhinitis due to pollen: Secondary | ICD-10-CM | POA: Diagnosis not present

## 2014-03-28 DIAGNOSIS — J3081 Allergic rhinitis due to animal (cat) (dog) hair and dander: Secondary | ICD-10-CM | POA: Diagnosis not present

## 2014-03-28 DIAGNOSIS — J301 Allergic rhinitis due to pollen: Secondary | ICD-10-CM | POA: Diagnosis not present

## 2014-03-28 DIAGNOSIS — J3089 Other allergic rhinitis: Secondary | ICD-10-CM | POA: Diagnosis not present

## 2014-04-07 DIAGNOSIS — J3089 Other allergic rhinitis: Secondary | ICD-10-CM | POA: Diagnosis not present

## 2014-04-07 DIAGNOSIS — J301 Allergic rhinitis due to pollen: Secondary | ICD-10-CM | POA: Diagnosis not present

## 2014-04-07 DIAGNOSIS — J3081 Allergic rhinitis due to animal (cat) (dog) hair and dander: Secondary | ICD-10-CM | POA: Diagnosis not present

## 2014-04-14 DIAGNOSIS — J3081 Allergic rhinitis due to animal (cat) (dog) hair and dander: Secondary | ICD-10-CM | POA: Diagnosis not present

## 2014-04-14 DIAGNOSIS — J3089 Other allergic rhinitis: Secondary | ICD-10-CM | POA: Diagnosis not present

## 2014-04-14 DIAGNOSIS — J301 Allergic rhinitis due to pollen: Secondary | ICD-10-CM | POA: Diagnosis not present

## 2014-04-24 DIAGNOSIS — J3089 Other allergic rhinitis: Secondary | ICD-10-CM | POA: Diagnosis not present

## 2014-04-24 DIAGNOSIS — J301 Allergic rhinitis due to pollen: Secondary | ICD-10-CM | POA: Diagnosis not present

## 2014-04-24 DIAGNOSIS — J3081 Allergic rhinitis due to animal (cat) (dog) hair and dander: Secondary | ICD-10-CM | POA: Diagnosis not present

## 2014-05-02 DIAGNOSIS — J3081 Allergic rhinitis due to animal (cat) (dog) hair and dander: Secondary | ICD-10-CM | POA: Diagnosis not present

## 2014-05-02 DIAGNOSIS — J301 Allergic rhinitis due to pollen: Secondary | ICD-10-CM | POA: Diagnosis not present

## 2014-05-02 DIAGNOSIS — J3089 Other allergic rhinitis: Secondary | ICD-10-CM | POA: Diagnosis not present

## 2014-05-15 DIAGNOSIS — J301 Allergic rhinitis due to pollen: Secondary | ICD-10-CM | POA: Diagnosis not present

## 2014-05-15 DIAGNOSIS — J3089 Other allergic rhinitis: Secondary | ICD-10-CM | POA: Diagnosis not present

## 2014-05-15 DIAGNOSIS — J3081 Allergic rhinitis due to animal (cat) (dog) hair and dander: Secondary | ICD-10-CM | POA: Diagnosis not present

## 2014-05-25 DIAGNOSIS — J3089 Other allergic rhinitis: Secondary | ICD-10-CM | POA: Diagnosis not present

## 2014-05-25 DIAGNOSIS — J301 Allergic rhinitis due to pollen: Secondary | ICD-10-CM | POA: Diagnosis not present

## 2014-05-25 DIAGNOSIS — J3081 Allergic rhinitis due to animal (cat) (dog) hair and dander: Secondary | ICD-10-CM | POA: Diagnosis not present

## 2014-06-08 DIAGNOSIS — J3081 Allergic rhinitis due to animal (cat) (dog) hair and dander: Secondary | ICD-10-CM | POA: Diagnosis not present

## 2014-06-08 DIAGNOSIS — J301 Allergic rhinitis due to pollen: Secondary | ICD-10-CM | POA: Diagnosis not present

## 2014-06-08 DIAGNOSIS — J3089 Other allergic rhinitis: Secondary | ICD-10-CM | POA: Diagnosis not present

## 2014-06-19 DIAGNOSIS — J301 Allergic rhinitis due to pollen: Secondary | ICD-10-CM | POA: Diagnosis not present

## 2014-06-19 DIAGNOSIS — J3089 Other allergic rhinitis: Secondary | ICD-10-CM | POA: Diagnosis not present

## 2014-06-19 DIAGNOSIS — J3081 Allergic rhinitis due to animal (cat) (dog) hair and dander: Secondary | ICD-10-CM | POA: Diagnosis not present

## 2014-07-07 DIAGNOSIS — J3081 Allergic rhinitis due to animal (cat) (dog) hair and dander: Secondary | ICD-10-CM | POA: Diagnosis not present

## 2014-07-07 DIAGNOSIS — J301 Allergic rhinitis due to pollen: Secondary | ICD-10-CM | POA: Diagnosis not present

## 2014-07-07 DIAGNOSIS — J3089 Other allergic rhinitis: Secondary | ICD-10-CM | POA: Diagnosis not present

## 2014-07-11 DIAGNOSIS — J3081 Allergic rhinitis due to animal (cat) (dog) hair and dander: Secondary | ICD-10-CM | POA: Diagnosis not present

## 2014-07-11 DIAGNOSIS — J301 Allergic rhinitis due to pollen: Secondary | ICD-10-CM | POA: Diagnosis not present

## 2014-07-11 DIAGNOSIS — J3089 Other allergic rhinitis: Secondary | ICD-10-CM | POA: Diagnosis not present

## 2014-07-24 DIAGNOSIS — J301 Allergic rhinitis due to pollen: Secondary | ICD-10-CM | POA: Diagnosis not present

## 2014-07-24 DIAGNOSIS — J3089 Other allergic rhinitis: Secondary | ICD-10-CM | POA: Diagnosis not present

## 2014-08-04 DIAGNOSIS — J3081 Allergic rhinitis due to animal (cat) (dog) hair and dander: Secondary | ICD-10-CM | POA: Diagnosis not present

## 2014-08-04 DIAGNOSIS — J3089 Other allergic rhinitis: Secondary | ICD-10-CM | POA: Diagnosis not present

## 2014-08-04 DIAGNOSIS — J301 Allergic rhinitis due to pollen: Secondary | ICD-10-CM | POA: Diagnosis not present

## 2014-08-07 DIAGNOSIS — J3081 Allergic rhinitis due to animal (cat) (dog) hair and dander: Secondary | ICD-10-CM | POA: Diagnosis not present

## 2014-08-07 DIAGNOSIS — J3089 Other allergic rhinitis: Secondary | ICD-10-CM | POA: Diagnosis not present

## 2014-08-07 DIAGNOSIS — J301 Allergic rhinitis due to pollen: Secondary | ICD-10-CM | POA: Diagnosis not present

## 2014-08-15 ENCOUNTER — Ambulatory Visit (INDEPENDENT_AMBULATORY_CARE_PROVIDER_SITE_OTHER): Payer: Medicare Other | Admitting: Internal Medicine

## 2014-08-15 ENCOUNTER — Encounter: Payer: Self-pay | Admitting: Internal Medicine

## 2014-08-15 VITALS — BP 152/100 | Temp 98.5°F | Ht 64.0 in | Wt 169.6 lb

## 2014-08-15 DIAGNOSIS — R059 Cough, unspecified: Secondary | ICD-10-CM

## 2014-08-15 DIAGNOSIS — Z8612 Personal history of poliomyelitis: Secondary | ICD-10-CM

## 2014-08-15 DIAGNOSIS — Z79899 Other long term (current) drug therapy: Secondary | ICD-10-CM

## 2014-08-15 DIAGNOSIS — R05 Cough: Secondary | ICD-10-CM | POA: Diagnosis not present

## 2014-08-15 DIAGNOSIS — J3089 Other allergic rhinitis: Secondary | ICD-10-CM

## 2014-08-15 DIAGNOSIS — I10 Essential (primary) hypertension: Secondary | ICD-10-CM | POA: Diagnosis not present

## 2014-08-15 DIAGNOSIS — Z0000 Encounter for general adult medical examination without abnormal findings: Secondary | ICD-10-CM

## 2014-08-15 DIAGNOSIS — E785 Hyperlipidemia, unspecified: Secondary | ICD-10-CM | POA: Insufficient documentation

## 2014-08-15 DIAGNOSIS — K219 Gastro-esophageal reflux disease without esophagitis: Secondary | ICD-10-CM

## 2014-08-15 LAB — CBC WITH DIFFERENTIAL/PLATELET
BASOS ABS: 0 10*3/uL (ref 0.0–0.1)
Basophils Relative: 0.4 % (ref 0.0–3.0)
EOS ABS: 0.1 10*3/uL (ref 0.0–0.7)
EOS PCT: 2.7 % (ref 0.0–5.0)
HCT: 44.7 % (ref 36.0–46.0)
Hemoglobin: 15 g/dL (ref 12.0–15.0)
LYMPHS ABS: 1.4 10*3/uL (ref 0.7–4.0)
Lymphocytes Relative: 28.2 % (ref 12.0–46.0)
MCHC: 33.5 g/dL (ref 30.0–36.0)
MCV: 86 fl (ref 78.0–100.0)
MONO ABS: 0.3 10*3/uL (ref 0.1–1.0)
Monocytes Relative: 7 % (ref 3.0–12.0)
Neutro Abs: 3.1 10*3/uL (ref 1.4–7.7)
Neutrophils Relative %: 61.7 % (ref 43.0–77.0)
PLATELETS: 185 10*3/uL (ref 150.0–400.0)
RBC: 5.2 Mil/uL — ABNORMAL HIGH (ref 3.87–5.11)
RDW: 13 % (ref 11.5–15.5)
WBC: 5 10*3/uL (ref 4.0–10.5)

## 2014-08-15 LAB — HEPATIC FUNCTION PANEL
ALBUMIN: 4.4 g/dL (ref 3.5–5.2)
ALK PHOS: 48 U/L (ref 39–117)
ALT: 24 U/L (ref 0–35)
AST: 17 U/L (ref 0–37)
Bilirubin, Direct: 0.1 mg/dL (ref 0.0–0.3)
TOTAL PROTEIN: 7 g/dL (ref 6.0–8.3)
Total Bilirubin: 0.6 mg/dL (ref 0.2–1.2)

## 2014-08-15 LAB — LIPID PANEL
CHOL/HDL RATIO: 3
Cholesterol: 153 mg/dL (ref 0–200)
HDL: 44.4 mg/dL (ref >39.00)
LDL Cholesterol: 81 mg/dL (ref 0–99)
NonHDL: 108.6
TRIGLYCERIDES: 136 mg/dL (ref 0.0–149.0)
VLDL: 27.2 mg/dL (ref 0.0–40.0)

## 2014-08-15 LAB — BASIC METABOLIC PANEL
BUN: 12 mg/dL (ref 6–23)
CALCIUM: 9.2 mg/dL (ref 8.4–10.5)
CHLORIDE: 102 meq/L (ref 96–112)
CO2: 31 mEq/L (ref 19–32)
Creatinine, Ser: 0.89 mg/dL (ref 0.40–1.20)
GFR: 66.66 mL/min (ref >60.00)
GLUCOSE: 112 mg/dL — AB (ref 70–99)
POTASSIUM: 4.6 meq/L (ref 3.5–5.1)
SODIUM: 137 meq/L (ref 135–145)

## 2014-08-15 MED ORDER — VALSARTAN-HYDROCHLOROTHIAZIDE 160-12.5 MG PO TABS: 1.0000 | ORAL_TABLET | Freq: Every day | ORAL | Status: DC

## 2014-08-15 NOTE — Patient Instructions (Addendum)
Yearly flu vaccine  We can change your blood pressure medication  To see if the lisinopril could be causing the cough .  And poss get better control of blood pressure some maintain on your Flonase every day and allergy meds. If the  Swallowing issues recurs then plan is to  see GI   paps not helpful over age 70-70 if no hx of cancer and no sx . Keep up with mammogram however . Small amounts of weight loss can be quite helpful to treat reflux. Will notify you  of labs when available. Fu in 2 months with BP readings alos

## 2014-08-15 NOTE — Progress Notes (Signed)
Pre visit review using our clinic review tool, if applicable. No additional management support is needed unless otherwise documented below in the visit note.  Chief Complaint  Patient presents with  . Medicare Wellness    medication s  . Hypertension    coughin still from may   . Hyperlipidemia    HPI: Cheryl Robbins 69 y.o. comes in today for Preventive Medicare wellness visit . And Chronic disease management   Allergy shots   18 months  Some help .   Cough and   Cough supine. Ever since respiratory infection in May. Just can't quite shake it no real shortness of breath some postnasal drainagepain no fever  bp check at home  130 range at home 83. Taking medicine every day  Hyperlipidemia taking simvastatin no untoward side effect noted.  Has problems with nocturnal heartburn at times since she's gained some weight took Mylanta and Benadryl and that helped. She had an episode of choking on some dried chicken about a year ago and her husband had to do a Heimlich maneuver. However currently no food getting stuck although sometimes feels spasm in the upper part of her chest. Eats earlier in the day to avoid nocturnal heartburn.  Health Maintenance  Topic Date Due  . INFLUENZA VACCINE  09/18/2014  . MAMMOGRAM  12/30/2015  . TETANUS/TDAP  07/29/2016  . COLONOSCOPY  02/26/2023  . DEXA SCAN  Completed  . ZOSTAVAX  Completed  . PNA vac Low Risk Adult  Completed   Health Maintenance Review LIFESTYLE:  Exercise:  Does regular walking Tobacco/ETS: No Alcohol: per day no Sugar beverages: No Sleep: Okay Drug use: no MEDICARE DOCUMENT QUESTIONS  TO SCAN   Hearing: ok  Vision:  No limitations at present . Last eye check UTD  Safety:  Has smoke detector and wears seat belts.  No firearms. No excess sun exposure. Sees dentist regularly.  Falls:  no  Advance directive :  Reviewed  Has one   Memory: Felt to be good  , no concern from her or her family.  Depression: No  anhedonia unusual crying or depressive symptoms  Nutrition: Eats well balanced diet; adequate calcium and vitamin D. No swallowing chewing problems.  Injury: no major injuries in the last six months.  Other healthcare providers:  Reviewed today .  Social:  Lives with spouse married. No pets.   Preventive parameters: up-to-date  Reviewed   ADLS:   There are no problems or need for assistance  driving, feeding, obtaining food, dressing, toileting and bathing, managing money using phone. She is independent.    ROS:  See above  GEN/ HEENT: No fever, significant weight changes sweats headaches vision problems hearing changes, CV/ PULM; No chest pain  syncope,edema  change in exercise tolerance. GI /GU: No adominal pain, vomiting, change in bowel habits. No blood in the stool. No significant GU symptoms. SKIN/HEME: ,no acute skin rashes suspicious lesions or bleeding. No lymphadenopathy, nodules, masses.  NEURO/ PSYCH:  No neurologic signs such as weakness numbness. No depression anxiety. IMM/ Allergy: No unusual infections.  Allergy .   REST of 12 system review negative except as per HPI   Past Medical History  Diagnosis Date  . Allergy   . GERD (gastroesophageal reflux disease)   . Headache(784.0)   . Hyperlipidemia   . Low back pain   . Rosacea   . Osteopenia   . 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  Primiparous   . Hypertension     Family History  Problem Relation Age of Onset  . Atrial fibrillation Mother     had cv in her 26 s  . Arthritis Mother     had staph infection knee replacment  . Cancer Sister     thryoid  . Fibromyalgia Brother   . Colon cancer Neg Hx   . Rectal cancer Neg Hx   . Stomach cancer Neg Hx     History   Social History  . Marital Status: Married    Spouse Name: N/A  . Number of Children: N/A  . Years of Education: N/A   Social History Main Topics  . Smoking status: Never Smoker   .  Smokeless tobacco: Never Used  . Alcohol Use: No  . Drug Use: No  . Sexual Activity: Not on file   Other Topics Concern  . None   Social History Narrative   hh of 2   No pets   No ets.   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 beta carotene w/minerals (OCUVITE) tablet Take 1 tablet by mouth daily.  . Calcium Carbonate-Vitamin D (CALTRATE 600+D) 600-400 MG-UNIT per tablet Take 1 tablet by mouth daily.  Marland Kitchen co-enzyme Q-10 50 MG capsule Take 150 mg by mouth daily.   . diphenhydrAMINE (BENADRYL) 25 MG tablet Take 25 mg by mouth every 6 (six) hours as needed.  . fluticasone (CUTIVATE) 0.05 % cream Apply topically 2 (two) times daily.  Marland Kitchen guaiFENesin (MUCINEX) 600 MG 12 hr tablet Take by mouth 2 (two) times daily.  Marland Kitchen HYDROcodone-homatropine (HYCODAN) 5-1.5 MG/5ML syrup Take 5 mLs by mouth every 4 (four) hours as needed for cough.  Marland Kitchen ibuprofen (ADVIL,MOTRIN) 200 MG tablet Take 200 mg by mouth as needed.  . loratadine (CLARITIN) 10 MG tablet Take 10 mg by mouth daily.  Marland Kitchen METRONIDAZOLE, TOPICAL, 0.75 % LOTN   . [DISCONTINUED] lisinopril-hydrochlorothiazide (PRINZIDE,ZESTORETIC) 20-12.5 MG per tablet take 1 tablet by mouth once daily  . simvastatin (ZOCOR) 20 MG tablet TAKE 1 TABLET AT BEDTIME  . valsartan-hydrochlorothiazide (DIOVAN-HCT) 160-12.5 MG per tablet Take 1 tablet by mouth daily.  . [DISCONTINUED] azelastine (ASTELIN) 0.1 % nasal spray   . [DISCONTINUED] Multiple Vitamins-Minerals (CENTRUM SILVER PO) Take by mouth daily.    . [DISCONTINUED] simvastatin (ZOCOR) 20 MG tablet TAKE 1 TABLET AT BEDTIME  . [DISCONTINUED] simvastatin (ZOCOR) 20 MG tablet TAKE 1 TABLET AT BEDTIME   No facility-administered encounter medications on file as of 08/15/2014.    EXAM:  BP 152/100 mmHg  Temp(Src) 98.5 F (36.9 C)  (Oral)  Ht 5\' 4"  (1.626 m)  Wt 169 lb 9.6 oz (76.93 kg)  BMI 29.10 kg/m2  Body mass index is 29.1 kg/(m^2).  Physical Exam: Vital signs reviewed [id number] is a well-developed well-nourished alert cooperative   who appears stated age in no acute distress.  HEENT: normocephalic atraumatic , Eyes: PERRL EOM's full, conjunctiva clear, wears glasses Nares: paten,t no deformity discharge or tenderness., Ears: no deformity EAC's clear TMs with normal landmarks. Mouth: clear OP, no lesions, edema. Some postnasal drainage cobblestoning no face pain Moist mucous membranes. Dentition in adequate repair. NECK: supple without masses, thyromegaly or bruits. CHEST/PULM:  Clear to auscultation and percussion breath sounds equal no  wheeze , rales or rhonchi. No chest wall deformities or tenderness.Breast: normal by inspection . No dimpling, discharge, masses, tenderness or discharge . CV: PMI is nondisplaced, S1 S2 no gallops, murmurs, rubs. Peripheral pulses are full without delay.No JVD .  ABDOMEN: Bowel sounds normal nontender  No guard or rebound, no hepato splenomegal no CVA tenderness.   Extremtities:  No clubbing cyanosis or edema, no acute joint swelling or redness no focal atrophy NEURO:  Oriented x3, cranial nerves 3-12 appear to be intact, no obvious focal weakness,gait within normal limits no abnormal reflexes or asymmetrical SKIN: No acute rashes normal turgor, color, no bruising or petechiae. Lower extremity bilaterally there is small dots red [place] peppre looking but out edema nonblanching petechial like no bruising or petechiae on body. PSYCH: Oriented, good eye contact, no obvious depression anxiety, cognition and judgment appear normal. LN: no cervical axillary inguinal adenopathy No noted deficits in memory, attention, and speech.   Lab Results  Component Value Date   WBC 5.0 08/15/2014   HGB 15.0 08/15/2014   HCT 44.7 08/15/2014   PLT 185.0 08/15/2014   GLUCOSE 112* 08/15/2014    CHOL 153 08/15/2014   TRIG 136.0 08/15/2014   HDL 44.40 08/15/2014   LDLDIRECT 171.0 06/17/2006   LDLCALC 81 08/15/2014   ALT 24 08/15/2014   AST 17 08/15/2014   NA 137 08/15/2014   K 4.6 08/15/2014   CL 102 08/15/2014   CREATININE 0.89 08/15/2014   BUN 12 08/15/2014   CO2 31 08/15/2014   TSH 1.77 06/03/2013    ASSESSMENT AND PLAN:  Discussed the following assessment and plan:  Medicare annual wellness visit, subsequent  Essential hypertension - Change medicine see text - Plan: Lipid panel, Basic metabolic panel, CBC with Differential/Platelet, Hepatic function panel  Hyperlipidemia - Lab monitoring today - Plan: Lipid panel, Basic metabolic panel, CBC with Differential/Platelet, Hepatic function panel  Medication management - Plan: Lipid panel, Basic metabolic panel, CBC with Differential/Platelet, Hepatic function panel  Cough - Plan: Lipid panel, Basic metabolic panel, CBC with Differential/Platelet, Hepatic function panel  Other allergic rhinitis  POLIOMYELITIS, HX OF  Gastroesophageal reflux disease, esophagitis presence not specified - History nocturnal symptoms intermittently remote history of a choking episode. Will follow lifestyle interventions at this time low threshold to refer to GI She has allergies it may have had a respiratory infection I don't see a bacterial infection at this point discussed changing to a nasal receptor blocker less likely to cause cough and persistent respiratory congestion. Change medication from lisinopril HCTZ and plan blood pressure contact follow-up in 2 months. Regard to the cough and blood pressure control. In certain how reflux is related but healthy lifestyle weight loss will help this Gus the current recommendations on Pap smear and prevention. She is not high risk. Last Pap at age 1 and normal no need to rescreen. Patient Care Team: Burnis Medin, MD as PCP - General Thornell Sartorius, MD (Otolaryngology) Allyn Kenner, MD  (Dermatology) Latanya Maudlin, MD (Orthopedic Surgery) ed Eddie Dibbles (Ophthalmology) Tiajuana Amass, MD as Consulting Physician (Allergy and Immunology)  Patient Instructions  Yearly flu vaccine  We can change your blood pressure medication  To see if the lisinopril could be causing the cough .  And poss get better control of blood pressure some maintain on your Flonase every day and allergy meds. If the  Swallowing issues recurs then plan is to  see GI   paps not helpful over age 43-70 if no hx of  cancer and no sx . Keep up with mammogram however . Small amounts of weight loss can be quite helpful to treat reflux. Will notify you  of labs when available. Fu in 2 months with BP readings alos    Mariann Laster K. Veryl Winemiller M.D.

## 2014-08-16 ENCOUNTER — Other Ambulatory Visit: Payer: Self-pay | Admitting: Family Medicine

## 2014-08-16 MED ORDER — SIMVASTATIN 20 MG PO TABS: 20.0000 mg | ORAL_TABLET | Freq: Every day | ORAL | Status: DC

## 2014-08-30 DIAGNOSIS — J301 Allergic rhinitis due to pollen: Secondary | ICD-10-CM | POA: Diagnosis not present

## 2014-08-30 DIAGNOSIS — J3089 Other allergic rhinitis: Secondary | ICD-10-CM | POA: Diagnosis not present

## 2014-08-30 DIAGNOSIS — J3081 Allergic rhinitis due to animal (cat) (dog) hair and dander: Secondary | ICD-10-CM | POA: Diagnosis not present

## 2014-09-01 DIAGNOSIS — J301 Allergic rhinitis due to pollen: Secondary | ICD-10-CM | POA: Diagnosis not present

## 2014-09-01 DIAGNOSIS — J3089 Other allergic rhinitis: Secondary | ICD-10-CM | POA: Diagnosis not present

## 2014-09-01 DIAGNOSIS — J3081 Allergic rhinitis due to animal (cat) (dog) hair and dander: Secondary | ICD-10-CM | POA: Diagnosis not present

## 2014-09-05 DIAGNOSIS — J3089 Other allergic rhinitis: Secondary | ICD-10-CM | POA: Diagnosis not present

## 2014-09-05 DIAGNOSIS — J301 Allergic rhinitis due to pollen: Secondary | ICD-10-CM | POA: Diagnosis not present

## 2014-09-08 DIAGNOSIS — J301 Allergic rhinitis due to pollen: Secondary | ICD-10-CM | POA: Diagnosis not present

## 2014-09-08 DIAGNOSIS — J3081 Allergic rhinitis due to animal (cat) (dog) hair and dander: Secondary | ICD-10-CM | POA: Diagnosis not present

## 2014-09-08 DIAGNOSIS — J3089 Other allergic rhinitis: Secondary | ICD-10-CM | POA: Diagnosis not present

## 2014-09-12 DIAGNOSIS — J3081 Allergic rhinitis due to animal (cat) (dog) hair and dander: Secondary | ICD-10-CM | POA: Diagnosis not present

## 2014-09-12 DIAGNOSIS — J301 Allergic rhinitis due to pollen: Secondary | ICD-10-CM | POA: Diagnosis not present

## 2014-09-12 DIAGNOSIS — J3089 Other allergic rhinitis: Secondary | ICD-10-CM | POA: Diagnosis not present

## 2014-09-29 DIAGNOSIS — J3089 Other allergic rhinitis: Secondary | ICD-10-CM | POA: Diagnosis not present

## 2014-09-29 DIAGNOSIS — J301 Allergic rhinitis due to pollen: Secondary | ICD-10-CM | POA: Diagnosis not present

## 2014-09-29 DIAGNOSIS — J3081 Allergic rhinitis due to animal (cat) (dog) hair and dander: Secondary | ICD-10-CM | POA: Diagnosis not present

## 2014-10-11 DIAGNOSIS — J3089 Other allergic rhinitis: Secondary | ICD-10-CM | POA: Diagnosis not present

## 2014-10-11 DIAGNOSIS — J301 Allergic rhinitis due to pollen: Secondary | ICD-10-CM | POA: Diagnosis not present

## 2014-10-11 DIAGNOSIS — J3081 Allergic rhinitis due to animal (cat) (dog) hair and dander: Secondary | ICD-10-CM | POA: Diagnosis not present

## 2014-10-16 ENCOUNTER — Encounter: Payer: Self-pay | Admitting: Internal Medicine

## 2014-10-16 ENCOUNTER — Ambulatory Visit (INDEPENDENT_AMBULATORY_CARE_PROVIDER_SITE_OTHER): Payer: Medicare Other | Admitting: Internal Medicine

## 2014-10-16 VITALS — BP 134/83 | Temp 98.6°F | Ht 64.0 in | Wt 165.1 lb

## 2014-10-16 DIAGNOSIS — R05 Cough: Secondary | ICD-10-CM

## 2014-10-16 DIAGNOSIS — R059 Cough, unspecified: Secondary | ICD-10-CM

## 2014-10-16 DIAGNOSIS — I10 Essential (primary) hypertension: Secondary | ICD-10-CM | POA: Diagnosis not present

## 2014-10-16 DIAGNOSIS — R739 Hyperglycemia, unspecified: Secondary | ICD-10-CM

## 2014-10-16 DIAGNOSIS — Z79899 Other long term (current) drug therapy: Secondary | ICD-10-CM

## 2014-10-16 DIAGNOSIS — R6889 Other general symptoms and signs: Secondary | ICD-10-CM

## 2014-10-16 DIAGNOSIS — Z6379 Other stressful life events affecting family and household: Secondary | ICD-10-CM

## 2014-10-16 LAB — BASIC METABOLIC PANEL
BUN: 13 mg/dL (ref 6–23)
CO2: 32 meq/L (ref 19–32)
Calcium: 9.7 mg/dL (ref 8.4–10.5)
Chloride: 99 mEq/L (ref 96–112)
Creatinine, Ser: 0.87 mg/dL (ref 0.40–1.20)
GFR: 68.4 mL/min (ref >60.00)
GLUCOSE: 106 mg/dL — AB (ref 70–99)
POTASSIUM: 4.4 meq/L (ref 3.5–5.1)
SODIUM: 138 meq/L (ref 135–145)

## 2014-10-16 LAB — HEMOGLOBIN A1C: HEMOGLOBIN A1C: 5.3 % (ref 4.6–6.5)

## 2014-10-16 LAB — T4, FREE: Free T4: 1.11 ng/dL (ref 0.60–1.60)

## 2014-10-16 LAB — TSH: TSH: 2.53 u[IU]/mL (ref 0.35–4.50)

## 2014-10-16 MED ORDER — VALSARTAN-HYDROCHLOROTHIAZIDE 160-12.5 MG PO TABS
1.0000 | ORAL_TABLET | Freq: Every day | ORAL | Status: DC
Start: 2014-10-16 — End: 2015-04-23

## 2014-10-16 NOTE — Progress Notes (Signed)
Pre visit review using our clinic review tool, if applicable. No additional management support is needed unless otherwise documented below in the visit note.  Chief Complaint  Patient presents with  . Follow-up    HPI: Cheryl Robbins 69 y.o. comes in or fu bp med change ,cough from poss acei effect  Cough is  Gone after a liong time   bp readings in 120 130 range  From home  Brought tin monitor  Gets hot flushes  At times  No other sx  Not menopausal Stressful month husband dex with spots o nliver poss metastatic cancer   Has prostate cancer on  [facility]  Keeping forward .   ROS: See pertinent positives and negatives per HPI.  Past Medical History  Diagnosis Date  . Allergy   . GERD (gastroesophageal reflux disease)   . Headache(784.0)   . Hyperlipidemia   . Low back pain   . Rosacea   . Osteopenia   . 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 Primiparous   . Hypertension     Family History  Problem Relation Age of Onset  . Atrial fibrillation Mother     had cv in her 47 s  . Arthritis Mother     had staph infection knee replacment  . Cancer Sister     thryoid  . Fibromyalgia Brother   . Colon cancer Neg Hx   . Rectal cancer Neg Hx   . Stomach cancer Neg Hx     Social History   Social History  . Marital Status: Married    Spouse Name: N/A  . Number of Children: N/A  . Years of Education: N/A   Social History Main Topics  . Smoking status: Never Smoker   . Smokeless tobacco: Never Used  . Alcohol Use: No  . Drug Use: No  . Sexual Activity: Not Asked   Other Topics Concern  . None   Social History Narrative   hh of 2   No pets   No ets.   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 beta carotene w/minerals (OCUVITE)  tablet Take 1 tablet by mouth daily.    . Calcium Carbonate-Vitamin D (CALTRATE 600+D) 600-400 MG-UNIT per tablet Take 1 tablet by mouth daily.    Marland Kitchen co-enzyme Q-10 50 MG capsule Take 150 mg by mouth daily.     . diphenhydrAMINE (BENADRYL) 25 MG tablet Take 25 mg by mouth every 6 (six) hours as needed.    . fluticasone (CUTIVATE) 0.05 % cream Apply topically 2 (two) times daily.    Marland Kitchen guaiFENesin (MUCINEX) 600 MG 12 hr tablet Take by mouth 2 (two) times daily.    Marland Kitchen ibuprofen (ADVIL,MOTRIN) 200 MG tablet Take 200 mg by mouth as needed.    . loratadine (CLARITIN) 10 MG tablet Take 10 mg by mouth daily.    Marland Kitchen METRONIDAZOLE, TOPICAL, 0.75 % LOTN     . simvastatin (ZOCOR) 20 MG tablet TAKE 1 TABLET AT BEDTIME 30 tablet 0  . simvastatin (ZOCOR) 20 MG tablet Take 1 tablet (20 mg total) by mouth at bedtime. 90 tablet 3  . valsartan-hydrochlorothiazide (DIOVAN-HCT) 160-12.5 MG per tablet Take 1 tablet by mouth  daily. 30 tablet 5  . HYDROcodone-homatropine (HYCODAN) 5-1.5 MG/5ML syrup Take 5 mLs by mouth every 4 (four) hours as needed for cough. 180 mL 0   No facility-administered medications prior to visit.     EXAM:  BP 134/83 mmHg  Temp(Src) 98.6 F (37 C) (Oral)  Ht 5\' 4"  (1.626 m)  Wt 165 lb 1.6 oz (74.889 kg)  BMI 28.33 kg/m2  Body mass index is 28.33 kg/(m^2).  GENERAL: vitals reviewed and listed above, alert, oriented, appears well hydrated and in no acute distress HEENT: atraumatic, conjunctiva  clear, no obvious abnormalities on inspection of external nose and ears OP : no lesion edema or exudate  NECK: no obvious masses on inspection palpation  PSYCH: pleasant and cooperative,  emotional at times talking of husbands predicament nl cognition speech BP Readings from Last 3 Encounters:  10/16/14 134/83  08/15/14 152/100  06/24/13 150/90   Lab Results  Component Value Date   WBC 5.0 08/15/2014   HGB 15.0 08/15/2014   HCT 44.7 08/15/2014   PLT 185.0 08/15/2014   GLUCOSE 112*  08/15/2014   CHOL 153 08/15/2014   TRIG 136.0 08/15/2014   HDL 44.40 08/15/2014   LDLDIRECT 171.0 06/17/2006   LDLCALC 81 08/15/2014   ALT 24 08/15/2014   AST 17 08/15/2014   NA 137 08/15/2014   K 4.6 08/15/2014   CL 102 08/15/2014   CREATININE 0.89 08/15/2014   BUN 12 08/15/2014   CO2 31 08/15/2014   TSH 1.77 06/03/2013     ASSESSMENT AND PLAN:  Discussed the following assessment and plan:  Essential hypertension - controlled  - Plan: Basic metabolic panel, TSH, T4, free, Hemoglobin A1c  Cough - resolved  off acei  Medication management - Plan: Basic metabolic panel, TSH, T4, free, Hemoglobin A1c  Heat intolerance - check tsh free t4  - Plan: Basic metabolic panel, TSH, T4, free, Hemoglobin A1c  Stress due to illness of family member  Hyperglycemia - check a1c  - Plan: Basic metabolic panel, TSH, T4, free, Hemoglobin A1c Total visit 36mins > 50% spent counseling and coordinating care as indicated in above note and in instructions to patient .   -Patient advised to return or notify health care team  if symptoms worsen ,persist or new concerns arise.  Patient Instructions  Glad  Your cough is better . Check labs  And stay on medication. Your BP machine is good enough off which to make decisions.  Contact us if  Problems   Will notify you  of labs when available. Someone  " safe " to talk with about your husbands illness is helpful getting through .  Not knowing is  Stress producing also  If all ok then yearly check or  If we can be of help.     Standley Brooking. Panosh M.D.

## 2014-10-16 NOTE — Patient Instructions (Addendum)
Glad  Your cough is better . Check labs  And stay on medication. Your BP machine is good enough off which to make decisions.  Contact us if  Problems   Will notify you  of labs when available. Someone  " safe " to talk with about your husbands illness is helpful getting through .  Not knowing is  Stress producing also  If all ok then yearly check or  If we can be of help.

## 2014-10-17 DIAGNOSIS — J301 Allergic rhinitis due to pollen: Secondary | ICD-10-CM | POA: Diagnosis not present

## 2014-10-17 DIAGNOSIS — J3089 Other allergic rhinitis: Secondary | ICD-10-CM | POA: Diagnosis not present

## 2014-10-17 DIAGNOSIS — J3081 Allergic rhinitis due to animal (cat) (dog) hair and dander: Secondary | ICD-10-CM | POA: Diagnosis not present

## 2014-10-26 DIAGNOSIS — J3081 Allergic rhinitis due to animal (cat) (dog) hair and dander: Secondary | ICD-10-CM | POA: Diagnosis not present

## 2014-10-26 DIAGNOSIS — J3089 Other allergic rhinitis: Secondary | ICD-10-CM | POA: Diagnosis not present

## 2014-10-26 DIAGNOSIS — J301 Allergic rhinitis due to pollen: Secondary | ICD-10-CM | POA: Diagnosis not present

## 2014-11-01 DIAGNOSIS — J301 Allergic rhinitis due to pollen: Secondary | ICD-10-CM | POA: Diagnosis not present

## 2014-11-01 DIAGNOSIS — J3081 Allergic rhinitis due to animal (cat) (dog) hair and dander: Secondary | ICD-10-CM | POA: Diagnosis not present

## 2014-11-01 DIAGNOSIS — J3089 Other allergic rhinitis: Secondary | ICD-10-CM | POA: Diagnosis not present

## 2014-11-06 ENCOUNTER — Ambulatory Visit (INDEPENDENT_AMBULATORY_CARE_PROVIDER_SITE_OTHER): Payer: Medicare Other | Admitting: Family Medicine

## 2014-11-06 DIAGNOSIS — Z23 Encounter for immunization: Secondary | ICD-10-CM | POA: Diagnosis not present

## 2014-11-13 DIAGNOSIS — J3089 Other allergic rhinitis: Secondary | ICD-10-CM | POA: Diagnosis not present

## 2014-11-13 DIAGNOSIS — J301 Allergic rhinitis due to pollen: Secondary | ICD-10-CM | POA: Diagnosis not present

## 2014-11-13 DIAGNOSIS — J3081 Allergic rhinitis due to animal (cat) (dog) hair and dander: Secondary | ICD-10-CM | POA: Diagnosis not present

## 2014-11-29 DIAGNOSIS — J3081 Allergic rhinitis due to animal (cat) (dog) hair and dander: Secondary | ICD-10-CM | POA: Diagnosis not present

## 2014-11-29 DIAGNOSIS — J301 Allergic rhinitis due to pollen: Secondary | ICD-10-CM | POA: Diagnosis not present

## 2014-11-29 DIAGNOSIS — J3089 Other allergic rhinitis: Secondary | ICD-10-CM | POA: Diagnosis not present

## 2014-12-04 DIAGNOSIS — J3089 Other allergic rhinitis: Secondary | ICD-10-CM | POA: Diagnosis not present

## 2014-12-04 DIAGNOSIS — J301 Allergic rhinitis due to pollen: Secondary | ICD-10-CM | POA: Diagnosis not present

## 2014-12-04 DIAGNOSIS — J3081 Allergic rhinitis due to animal (cat) (dog) hair and dander: Secondary | ICD-10-CM | POA: Diagnosis not present

## 2014-12-14 DIAGNOSIS — J301 Allergic rhinitis due to pollen: Secondary | ICD-10-CM | POA: Diagnosis not present

## 2014-12-14 DIAGNOSIS — J3089 Other allergic rhinitis: Secondary | ICD-10-CM | POA: Diagnosis not present

## 2014-12-14 DIAGNOSIS — J3081 Allergic rhinitis due to animal (cat) (dog) hair and dander: Secondary | ICD-10-CM | POA: Diagnosis not present

## 2014-12-15 ENCOUNTER — Encounter: Payer: Self-pay | Admitting: Internal Medicine

## 2014-12-18 DIAGNOSIS — J3089 Other allergic rhinitis: Secondary | ICD-10-CM | POA: Diagnosis not present

## 2014-12-18 DIAGNOSIS — J3081 Allergic rhinitis due to animal (cat) (dog) hair and dander: Secondary | ICD-10-CM | POA: Diagnosis not present

## 2014-12-18 DIAGNOSIS — J301 Allergic rhinitis due to pollen: Secondary | ICD-10-CM | POA: Diagnosis not present

## 2014-12-29 DIAGNOSIS — J3081 Allergic rhinitis due to animal (cat) (dog) hair and dander: Secondary | ICD-10-CM | POA: Diagnosis not present

## 2014-12-29 DIAGNOSIS — J3089 Other allergic rhinitis: Secondary | ICD-10-CM | POA: Diagnosis not present

## 2014-12-29 DIAGNOSIS — J301 Allergic rhinitis due to pollen: Secondary | ICD-10-CM | POA: Diagnosis not present

## 2015-01-01 DIAGNOSIS — Z1231 Encounter for screening mammogram for malignant neoplasm of breast: Secondary | ICD-10-CM | POA: Diagnosis not present

## 2015-01-03 DIAGNOSIS — J301 Allergic rhinitis due to pollen: Secondary | ICD-10-CM | POA: Diagnosis not present

## 2015-01-03 DIAGNOSIS — J3081 Allergic rhinitis due to animal (cat) (dog) hair and dander: Secondary | ICD-10-CM | POA: Diagnosis not present

## 2015-01-03 DIAGNOSIS — J3089 Other allergic rhinitis: Secondary | ICD-10-CM | POA: Diagnosis not present

## 2015-01-08 DIAGNOSIS — J301 Allergic rhinitis due to pollen: Secondary | ICD-10-CM | POA: Diagnosis not present

## 2015-01-08 DIAGNOSIS — J3089 Other allergic rhinitis: Secondary | ICD-10-CM | POA: Diagnosis not present

## 2015-01-08 DIAGNOSIS — J3081 Allergic rhinitis due to animal (cat) (dog) hair and dander: Secondary | ICD-10-CM | POA: Diagnosis not present

## 2015-01-19 DIAGNOSIS — J3081 Allergic rhinitis due to animal (cat) (dog) hair and dander: Secondary | ICD-10-CM | POA: Diagnosis not present

## 2015-01-19 DIAGNOSIS — J301 Allergic rhinitis due to pollen: Secondary | ICD-10-CM | POA: Diagnosis not present

## 2015-01-19 DIAGNOSIS — J3089 Other allergic rhinitis: Secondary | ICD-10-CM | POA: Diagnosis not present

## 2015-01-26 DIAGNOSIS — J3089 Other allergic rhinitis: Secondary | ICD-10-CM | POA: Diagnosis not present

## 2015-01-26 DIAGNOSIS — J301 Allergic rhinitis due to pollen: Secondary | ICD-10-CM | POA: Diagnosis not present

## 2015-01-26 DIAGNOSIS — J3081 Allergic rhinitis due to animal (cat) (dog) hair and dander: Secondary | ICD-10-CM | POA: Diagnosis not present

## 2015-01-30 ENCOUNTER — Encounter: Payer: Self-pay | Admitting: Internal Medicine

## 2015-02-01 DIAGNOSIS — J3089 Other allergic rhinitis: Secondary | ICD-10-CM | POA: Diagnosis not present

## 2015-02-01 DIAGNOSIS — J3081 Allergic rhinitis due to animal (cat) (dog) hair and dander: Secondary | ICD-10-CM | POA: Diagnosis not present

## 2015-02-01 DIAGNOSIS — J301 Allergic rhinitis due to pollen: Secondary | ICD-10-CM | POA: Diagnosis not present

## 2015-02-07 DIAGNOSIS — J301 Allergic rhinitis due to pollen: Secondary | ICD-10-CM | POA: Diagnosis not present

## 2015-02-07 DIAGNOSIS — J3081 Allergic rhinitis due to animal (cat) (dog) hair and dander: Secondary | ICD-10-CM | POA: Diagnosis not present

## 2015-02-07 DIAGNOSIS — J3089 Other allergic rhinitis: Secondary | ICD-10-CM | POA: Diagnosis not present

## 2015-02-14 DIAGNOSIS — J301 Allergic rhinitis due to pollen: Secondary | ICD-10-CM | POA: Diagnosis not present

## 2015-02-14 DIAGNOSIS — J3081 Allergic rhinitis due to animal (cat) (dog) hair and dander: Secondary | ICD-10-CM | POA: Diagnosis not present

## 2015-02-14 DIAGNOSIS — J3089 Other allergic rhinitis: Secondary | ICD-10-CM | POA: Diagnosis not present

## 2015-03-08 DIAGNOSIS — J301 Allergic rhinitis due to pollen: Secondary | ICD-10-CM | POA: Diagnosis not present

## 2015-03-08 DIAGNOSIS — J3081 Allergic rhinitis due to animal (cat) (dog) hair and dander: Secondary | ICD-10-CM | POA: Diagnosis not present

## 2015-03-08 DIAGNOSIS — J3089 Other allergic rhinitis: Secondary | ICD-10-CM | POA: Diagnosis not present

## 2015-03-20 ENCOUNTER — Encounter: Payer: Self-pay | Admitting: Internal Medicine

## 2015-04-23 ENCOUNTER — Other Ambulatory Visit: Payer: Self-pay | Admitting: Family Medicine

## 2015-04-23 ENCOUNTER — Telehealth: Payer: Self-pay | Admitting: Family Medicine

## 2015-04-23 MED ORDER — VALSARTAN-HYDROCHLOROTHIAZIDE 160-12.5 MG PO TABS: 1.0000 | ORAL_TABLET | Freq: Every day | ORAL | Status: DC

## 2015-04-23 NOTE — Telephone Encounter (Signed)
Sent to the pharmacy by e-scribe for 90 days.  Message sent to scheduling to help her make medicare wellness appt in June 2017

## 2015-04-23 NOTE — Telephone Encounter (Signed)
Pt is due for medicare wellness in June 2017.  Please help her to make this fasting appointment.  Thanks!

## 2015-04-24 NOTE — Telephone Encounter (Signed)
Pt has been sch

## 2015-05-07 ENCOUNTER — Telehealth: Payer: Self-pay | Admitting: Internal Medicine

## 2015-05-07 MED ORDER — SIMVASTATIN 20 MG PO TABS: 20.0000 mg | ORAL_TABLET | Freq: Every day | ORAL | Status: DC

## 2015-05-07 NOTE — Telephone Encounter (Signed)
Sent to the pharmacy by e-scribe.  Pt has upcoming wellness on 08/22/15.

## 2015-05-07 NOTE — Telephone Encounter (Signed)
[facility] called to transfer pt's rx from mailorder to them. Pt request refill  simvastatin (ZOCOR) 20 MG tablet 90 day [facility] [id number]

## 2015-08-21 NOTE — Patient Instructions (Addendum)
Will notify you  of labs when available. Ok to stay off the simva for now .  BP  goal  Is below 140/90 at home   Check this an get back to [place].  If elevated   Stretch yoga massage all ok for back  Walking  Good shoes  Etc. Fu if  persistent or progressive            Health Maintenance, Female Adopting a healthy lifestyle and getting preventive care can go a long way to promote health and wellness. Talk with your health care provider about what schedule of regular examinations is right for you. This is a good chance for you to check in with your provider about disease prevention and staying healthy. In between checkups, there are plenty of things you can do on your own. Experts have done a lot of research about which lifestyle changes and preventive measures are most likely to keep you healthy. Ask your health care provider for more information. WEIGHT AND DIET  Eat a healthy diet  Be sure to include plenty of vegetables, fruits, low-fat dairy products, and lean protein.  Do not eat a lot of foods high in solid fats, added sugars, or salt.  Get regular exercise. This is one of the most important things you can do for your health.  Most adults should exercise for at least 150 minutes each week. The exercise should increase your heart rate and make you sweat (moderate-intensity exercise).  Most adults should also do strengthening exercises at least twice a week. This is in addition to the moderate-intensity exercise.  Maintain a healthy weight  Body mass index (BMI) is a measurement that can be used to identify possible weight problems. It estimates body fat based on height and weight. Your health care provider can help determine your BMI and help you achieve or maintain a healthy weight.  For females 64 years of age and older:   A BMI below 18.5 is considered underweight.  A BMI of 18.5 to 24.9 is normal.  A BMI of 25 to 29.9 is considered overweight.  A BMI of 30 and above  is considered obese.  Watch levels of cholesterol and blood lipids  You should start having your blood tested for lipids and cholesterol at 71 years of age, then have this test every 5 years.  You may need to have your cholesterol levels checked more often if:  Your lipid or cholesterol levels are high.  You are older than 71 years of age.  You are at high risk for heart disease.  CANCER SCREENING   Lung Cancer  Lung cancer screening is recommended for adults 80-72 years old who are at high risk for lung cancer because of a history of smoking.  A yearly low-dose CT scan of the lungs is recommended for people who:  Currently smoke.  Have quit within the past 15 years.  Have at least a 30-pack-year history of smoking. A pack year is smoking an average of one pack of cigarettes a day for 1 year.  Yearly screening should continue until it has been 15 years since you quit.  Yearly screening should stop if you develop a health problem that would prevent you from having lung cancer treatment.  Breast Cancer  Practice breast self-awareness. This means understanding how your breasts normally appear and feel.  It also means doing regular breast self-exams. Let your health care provider know about any changes, no matter how small.  If you  are in your 20s or 30s, you should have a clinical breast exam (CBE) by a health care provider every 1-3 years as part of a regular health exam.  If you are 91 or older, have a CBE every year. Also consider having a breast X-ray (mammogram) every year.  If you have a family history of breast cancer, talk to your health care provider about genetic screening.  If you are at high risk for breast cancer, talk to your health care provider about having an MRI and a mammogram every year.  Breast cancer gene (BRCA) assessment is recommended for women who have family members with BRCA-related cancers. BRCA-related cancers  include:  Breast.  Ovarian.  Tubal.  Peritoneal cancers.  Results of the assessment will determine the need for genetic counseling and BRCA1 and BRCA2 testing. Cervical Cancer Your health care provider may recommend that you be screened regularly for cancer of the pelvic organs (ovaries, uterus, and vagina). This screening involves a pelvic examination, including checking for microscopic changes to the surface of your cervix (Pap test). You may be encouraged to have this screening done every 3 years, beginning at age 65.  For women ages 52-65, health care providers may recommend pelvic exams and Pap testing every 3 years, or they may recommend the Pap and pelvic exam, combined with testing for human papilloma virus (HPV), every 5 years. Some types of HPV increase your risk of cervical cancer. Testing for HPV may also be done on women of any age with unclear Pap test results.  Other health care providers may not recommend any screening for nonpregnant women who are considered low risk for pelvic cancer and who do not have symptoms. Ask your health care provider if a screening pelvic exam is right for you.  If you have had past treatment for cervical cancer or a condition that could lead to cancer, you need Pap tests and screening for cancer for at least 20 years after your treatment. If Pap tests have been discontinued, your risk factors (such as having a new sexual partner) need to be reassessed to determine if screening should resume. Some women have medical problems that increase the chance of getting cervical cancer. In these cases, your health care provider may recommend more frequent screening and Pap tests. Colorectal Cancer  This type of cancer can be detected and often prevented.  Routine colorectal cancer screening usually begins at 71 years of age and continues through 71 years of age.  Your health care provider may recommend screening at an earlier age if you have risk factors for  colon cancer.  Your health care provider may also recommend using home test kits to check for hidden blood in the stool.  A small camera at the end of a tube can be used to examine your colon directly (sigmoidoscopy or colonoscopy). This is done to check for the earliest forms of colorectal cancer.  Routine screening usually begins at age 31.  Direct examination of the colon should be repeated every 5-10 years through 71 years of age. However, you may need to be screened more often if early forms of precancerous polyps or small growths are found. Skin Cancer  Check your skin from head to toe regularly.  Tell your health care provider about any new moles or changes in moles, especially if there is a change in a mole's shape or color.  Also tell your health care provider if you have a mole that is larger than the size of  a pencil eraser.  Always use sunscreen. Apply sunscreen liberally and repeatedly throughout the day.  Protect yourself by wearing long sleeves, pants, a wide-brimmed hat, and sunglasses whenever you are outside. HEART DISEASE, DIABETES, AND HIGH BLOOD PRESSURE   High blood pressure causes heart disease and increases the risk of stroke. High blood pressure is more likely to develop in:  People who have blood pressure in the high end of the normal range (130-139/85-89 mm Hg).  People who are overweight or obese.  People who are African American.  If you are 4-68 years of age, have your blood pressure checked every 3-5 years. If you are 14 years of age or older, have your blood pressure checked every year. You should have your blood pressure measured twice--once when you are at a hospital or clinic, and once when you are not at a hospital or clinic. Record the average of the two measurements. To check your blood pressure when you are not at a hospital or clinic, you can use:  An automated blood pressure machine at a pharmacy.  A home blood pressure monitor.  If you  are between 61 years and 61 years old, ask your health care provider if you should take aspirin to prevent strokes.  Have regular diabetes screenings. This involves taking a blood sample to check your fasting blood sugar level.  If you are at a normal weight and have a low risk for diabetes, have this test once every three years after 71 years of age.  If you are overweight and have a high risk for diabetes, consider being tested at a younger age or more often. PREVENTING INFECTION  Hepatitis B  If you have a higher risk for hepatitis B, you should be screened for this virus. You are considered at high risk for hepatitis B if:  You were born in a country where hepatitis B is common. Ask your health care provider which countries are considered high risk.  Your parents were born in a high-risk country, and you have not been immunized against hepatitis B (hepatitis B vaccine).  You have HIV or AIDS.  You use needles to inject street drugs.  You live with someone who has hepatitis B.  You have had sex with someone who has hepatitis B.  You get hemodialysis treatment.  You take certain medicines for conditions, including cancer, organ transplantation, and autoimmune conditions. Hepatitis C  Blood testing is recommended for:  Everyone born from [birth year] through 1966.  Anyone with known risk factors for hepatitis C. Sexually transmitted infections (STIs)  You should be screened for sexually transmitted infections (STIs) including gonorrhea and chlamydia if:  You are sexually active and are younger than 71 years of age.  You are older than 71 years of age and your health care provider tells you that you are at risk for this type of infection.  Your sexual activity has changed since you were last screened and you are at an increased risk for chlamydia or gonorrhea. Ask your health care provider if you are at risk.  If you do not have HIV, but are at risk, it may be recommended that you  take a prescription medicine daily to prevent HIV infection. This is called pre-exposure prophylaxis (PrEP). You are considered at risk if:  You are sexually active and do not regularly use condoms or know the HIV status of your partner(s).  You take drugs by injection.  You are sexually active with a partner who has HIV.  Talk with your health care provider about whether you are at high risk of being infected with HIV. If you choose to begin PrEP, you should first be tested for HIV. You should then be tested every 3 months for as long as you are taking PrEP.  PREGNANCY   If you are premenopausal and you may become pregnant, ask your health care provider about preconception counseling.  If you may become pregnant, take 400 to 800 micrograms (mcg) of folic acid every day.  If you want to prevent pregnancy, talk to your health care provider about birth control (contraception). OSTEOPOROSIS AND MENOPAUSE   Osteoporosis is a disease in which the bones lose minerals and strength with aging. This can result in serious bone fractures. Your risk for osteoporosis can be identified using a bone density scan.  If you are 64 years of age or older, or if you are at risk for osteoporosis and fractures, ask your health care provider if you should be screened.  Ask your health care provider whether you should take a calcium or vitamin D supplement to lower your risk for osteoporosis.  Menopause may have certain physical symptoms and risks.  Hormone replacement therapy may reduce some of these symptoms and risks. Talk to your health care provider about whether hormone replacement therapy is right for you.  HOME CARE INSTRUCTIONS   Schedule regular health, dental, and eye exams.  Stay current with your immunizations.   Do not use any tobacco products including cigarettes, chewing tobacco, or electronic cigarettes.  If you are pregnant, do not drink alcohol.  If you are breastfeeding, limit how  much and how often you drink alcohol.  Limit alcohol intake to no more than 1 drink per day for nonpregnant women. One drink equals 12 ounces of beer, 5 ounces of wine, or 1 ounces of hard liquor.  Do not use street drugs.  Do not share needles.  Ask your health care provider for help if you need support or information about quitting drugs.  Tell your health care provider if you often feel depressed.  Tell your health care provider if you have ever been abused or do not feel safe at home.   This information is not intended to replace advice given to you by your health care provider. Make sure you discuss any questions you have with your health care provider.   Document Released: 08/19/2010 Document Revised: 02/24/2014 Document Reviewed: 01/05/2013 Elsevier Interactive Patient Education 2016 [place] protect organs, store calcium, and anchor muscles. Good health habits, such as eating nutritious foods and exercising regularly, are important for maintaining healthy bones. They can also help to prevent a condition that causes bones to lose density and become weak and brittle (osteoporosis). WHY IS BONE MASS IMPORTANT? Bone mass refers to the amount of bone tissue that you have. The higher your bone mass, the stronger your bones. An important step toward having healthy bones throughout life is to have strong and dense bones during childhood. A young adult who has a high bone mass is more likely to have a high bone mass later in life. Bone mass at its greatest it is called peak bone mass. A large decline in bone mass occurs in older adults. In women, it occurs about the time of menopause. During this time, it is important to practice good health habits, because if more bone is lost than what is replaced, the bones will become less healthy and more likely to  break (fracture). If you find that you have a low bone mass, you may be able to prevent osteoporosis or further  bone loss by changing your diet and lifestyle. HOW CAN I FIND OUT IF MY BONE MASS IS LOW? Bone mass can be measured with an X-ray test that is called a bone mineral density (BMD) test. This test is recommended for all women who are age 545 or older. It may also be recommended for men who are age 52 or older, or for people who are more likely to develop osteoporosis due to:  Having bones that break easily.  Having a long-term disease that weakens bones, such as kidney disease or rheumatoid arthritis.  Having menopause earlier than normal.  Taking medicine that weakens bones, such as steroids, thyroid hormones, or hormone treatment for breast cancer or prostate cancer.  Smoking.  Drinking three or more alcoholic drinks each day. WHAT ARE THE NUTRITIONAL RECOMMENDATIONS FOR HEALTHY BONES? To have healthy bones, you need to get enough of the right minerals and vitamins. Most nutrition experts recommend getting these nutrients from the foods that you eat. Nutritional recommendations vary from person to person. Ask your health care provider what is healthy for you. Here are some general guidelines. Calcium Recommendations Calcium is the most important (essential) mineral for bone health. Most people can get enough calcium from their diet, but supplements may be recommended for people who are at risk for osteoporosis. Good sources of calcium include:  Dairy products, such as low-fat or nonfat milk, cheese, and yogurt.  Dark green leafy vegetables, such as bok choy and broccoli.  Calcium-fortified foods, such as orange juice, cereal, bread, soy beverages, and tofu products.  Nuts, such as almonds. Follow these recommended amounts for daily calcium intake:  Children, age 22-3: 700 mg.  Children, age 54-8: 1,000 mg.  Children, age 544-13: 1,300 mg.  Teens, age 86-18: 1,300 mg.  Adults, age 542-50: 1,000 mg.  Adults, age 39-70:  Men: 1,000 mg.  Women: 1,200 mg.  Adults, age 70 or older:  1,200 mg.  Pregnant and breastfeeding females:  Teens: 1,300 mg.  Adults: 1,000 mg. Vitamin D Recommendations Vitamin D is the most essential vitamin for bone health. It helps the body to absorb calcium. Sunlight stimulates the skin to make vitamin D, so be sure to get enough sunlight. If you live in a cold climate or you do not get outside often, your health care provider may recommend that you take vitamin D supplements. Good sources of vitamin D in your diet include:  Egg yolks.  Saltwater fish.  Milk and cereal fortified with vitamin D. Follow these recommended amounts for daily vitamin D intake:  Children and teens, age 22-18: 600 international units.  Adults, age 74 or younger: 400-800 international units.  Adults, age 29 or older: 800-1,000 international units. Other Nutrients Other nutrients for bone health include:  Phosphorus. This mineral is found in meat, poultry, dairy foods, nuts, and legumes. The recommended daily intake for adult men and adult women is 700 mg.  Magnesium. This mineral is found in seeds, nuts, dark green vegetables, and legumes. The recommended daily intake for adult men is 400-420 mg. For adult women, it is 310-320 mg.  Vitamin K. This vitamin is found in green leafy vegetables. The recommended daily intake is 120 mg for adult men and 90 mg for adult women. WHAT TYPE OF PHYSICAL ACTIVITY IS BEST FOR BUILDING AND MAINTAINING HEALTHY BONES? Weight-bearing and strength-building activities are important for building  and maintaining peak bone mass. Weight-bearing activities cause muscles and bones to work against gravity. Strength-building activities increases muscle strength that supports bones. Weight-bearing and muscle-building activities include:  Walking and hiking.  Jogging and running.  Dancing.  Gym exercises.  Lifting weights.  Tennis and racquetball.  Climbing stairs.  Aerobics. Adults should get at least 30 minutes of moderate  physical activity on most days. Children should get at least 60 minutes of moderate physical activity on most days. Ask your health care provide what type of exercise is best for you. WHERE CAN I FIND MORE INFORMATION? For more information, check out the following websites:  [place]: [employer]  [employer] [facility] [employer]: http://www.niams.AnonymousEar.fr.asp   This information is not intended to replace advice given to you by your health care provider. Make sure you discuss any questions you have with your health care provider.   Document Released: 04/26/2003 Document Revised: 06/20/2014 Document Reviewed: 02/08/2014 Elsevier Interactive Patient Education [employer].

## 2015-08-21 NOTE — Progress Notes (Signed)
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 people can get enough calcium from their diet, but supplements may be recommended for people who are at risk for osteoporosis. Good sources of calcium include:  Dairy products, such as low-fat or nonfat milk, cheese, and yogurt.  Dark green leafy vegetables, such as bok choy and broccoli.  Calcium-fortified foods, such as orange juice, cereal, bread, soy beverages, and tofu products.  Nuts, such as almonds. Follow these recommended amounts for daily calcium intake:  Children, age 97-3: 700 mg.  Children, age 92-8: 1,000 mg.  Children, age 28-13: 1,300 mg.  Teens, age 53-18: 1,300 mg.  Adults, age 51-50: 1,000 mg.  Adults, age 71-70:  Men: 1,000 mg.  Women: 1,200 mg.  Adults, age 69 or older: 1,200 mg.  Pregnant and breastfeeding females:  Teens: 1,300 mg.  Adults: 1,000 mg. Vitamin D Recommendations Vitamin D is the most essential vitamin for bone health. It helps the body to  absorb calcium. Sunlight stimulates the skin to make vitamin D, so be sure to get enough sunlight. If you live in a cold climate or you do not get outside often, your health care provider may recommend that you take vitamin D supplements. Good sources of vitamin D in your diet include:  Egg yolks.  Saltwater fish.  Milk and cereal fortified with vitamin D. Follow these recommended amounts for daily vitamin D intake:  Children and teens, age 97-18: 600 international units.  Adults, age 23 or younger: 400-800 international units.  Adults, age 970 or older: 800-1,000 international units. Other Nutrients Other nutrients for bone health include:  Phosphorus. This mineral is found in meat, poultry, dairy foods, nuts, and legumes. The recommended daily intake for adult men and adult women is 700 mg.  Magnesium. This mineral is found in seeds, nuts, dark green vegetables, and legumes. The recommended daily intake for adult men is 400-420 mg. For adult women, it is 310-320 mg.  Vitamin K. This vitamin is found in green leafy vegetables. The recommended daily intake is 120 mg for adult men and 90 mg for adult women. WHAT TYPE OF PHYSICAL ACTIVITY IS BEST FOR BUILDING AND MAINTAINING HEALTHY BONES? Weight-bearing and strength-building activities  are important for building and maintaining peak bone mass. Weight-bearing activities cause muscles and bones to work against gravity. Strength-building activities increases muscle strength that supports bones. Weight-bearing and muscle-building activities include:  Walking and hiking.  Jogging and running.  Dancing.  Gym exercises.  Lifting weights.  Tennis and racquetball.  Climbing stairs.  Aerobics. Adults should get at least 30 minutes of moderate physical activity on most days. Children should get at least 60 minutes of moderate physical activity on most days. Ask your health care provide what type of exercise is best for you. WHERE CAN I  FIND MORE INFORMATION? For more information, check out the following websites:  [place]: YardHomes.se  [employer] of Health: http://www.niams.AnonymousEar.fr.asp   This information is not intended to replace advice given to you by your health care provider. Make sure you discuss any questions you have with your health care provider.   Document Released: 04/26/2003 Document Revised: 06/20/2014 Document Reviewed: 02/08/2014 Elsevier Interactive Patient Education 2016 [place] K. Ayoub Arey M.D.

## 2015-08-22 ENCOUNTER — Ambulatory Visit (INDEPENDENT_AMBULATORY_CARE_PROVIDER_SITE_OTHER): Payer: Medicare Other | Admitting: Internal Medicine

## 2015-08-22 ENCOUNTER — Encounter: Payer: Self-pay | Admitting: Internal Medicine

## 2015-08-22 VITALS — BP 160/92 | Temp 98.0°F | Ht 64.0 in | Wt 154.8 lb

## 2015-08-22 DIAGNOSIS — I10 Essential (primary) hypertension: Secondary | ICD-10-CM | POA: Diagnosis not present

## 2015-08-22 DIAGNOSIS — T887XXA Unspecified adverse effect of drug or medicament, initial encounter: Secondary | ICD-10-CM

## 2015-08-22 DIAGNOSIS — E785 Hyperlipidemia, unspecified: Secondary | ICD-10-CM | POA: Diagnosis not present

## 2015-08-22 DIAGNOSIS — Z23 Encounter for immunization: Secondary | ICD-10-CM | POA: Diagnosis not present

## 2015-08-22 DIAGNOSIS — Z1159 Encounter for screening for other viral diseases: Secondary | ICD-10-CM | POA: Diagnosis not present

## 2015-08-22 DIAGNOSIS — M545 Low back pain, unspecified: Secondary | ICD-10-CM

## 2015-08-22 DIAGNOSIS — R739 Hyperglycemia, unspecified: Secondary | ICD-10-CM | POA: Diagnosis not present

## 2015-08-22 DIAGNOSIS — Z0000 Encounter for general adult medical examination without abnormal findings: Secondary | ICD-10-CM | POA: Diagnosis not present

## 2015-08-22 DIAGNOSIS — Z634 Disappearance and death of family member: Secondary | ICD-10-CM

## 2015-08-22 DIAGNOSIS — T50905A Adverse effect of unspecified drugs, medicaments and biological substances, initial encounter: Secondary | ICD-10-CM

## 2015-08-22 LAB — POCT URINALYSIS DIPSTICK
Bilirubin, UA: NEGATIVE
Blood, UA: NEGATIVE
GLUCOSE UA: NEGATIVE
Ketones, UA: NEGATIVE
LEUKOCYTES UA: NEGATIVE
NITRITE UA: NEGATIVE
PROTEIN UA: NEGATIVE
UROBILINOGEN UA: 0.2
pH, UA: 7

## 2015-08-22 LAB — CBC WITH DIFFERENTIAL/PLATELET
BASOS ABS: 0 10*3/uL (ref 0.0–0.1)
BASOS PCT: 0.2 % (ref 0.0–3.0)
EOS ABS: 0.1 10*3/uL (ref 0.0–0.7)
Eosinophils Relative: 1.2 % (ref 0.0–5.0)
HEMATOCRIT: 42.7 % (ref 36.0–46.0)
Hemoglobin: 14.6 g/dL (ref 12.0–15.0)
LYMPHS ABS: 1.3 10*3/uL (ref 0.7–4.0)
LYMPHS PCT: 25.1 % (ref 12.0–46.0)
MCHC: 34.2 g/dL (ref 30.0–36.0)
MCV: 86.3 fl (ref 78.0–100.0)
MONOS PCT: 6.4 % (ref 3.0–12.0)
Monocytes Absolute: 0.3 10*3/uL (ref 0.1–1.0)
NEUTROS ABS: 3.4 10*3/uL (ref 1.4–7.7)
NEUTROS PCT: 67.1 % (ref 43.0–77.0)
PLATELETS: 216 10*3/uL (ref 150.0–400.0)
RBC: 4.95 Mil/uL (ref 3.87–5.11)
RDW: 12.6 % (ref 11.5–15.5)
WBC: 5.1 10*3/uL (ref 4.0–10.5)

## 2015-08-22 LAB — BASIC METABOLIC PANEL
BUN: 11 mg/dL (ref 6–23)
CALCIUM: 9.4 mg/dL (ref 8.4–10.5)
CHLORIDE: 95 meq/L — AB (ref 96–112)
CO2: 31 meq/L (ref 19–32)
CREATININE: 0.81 mg/dL (ref 0.40–1.20)
GFR: 74.1 mL/min (ref >60.00)
GLUCOSE: 90 mg/dL (ref 70–99)
POTASSIUM: 4.4 meq/L (ref 3.5–5.1)
SODIUM: 132 meq/L — AB (ref 135–145)

## 2015-08-22 LAB — HEPATIC FUNCTION PANEL
ALK PHOS: 36 U/L — AB (ref 39–117)
ALT: 14 U/L (ref 0–35)
AST: 11 U/L (ref 0–37)
Albumin: 4.4 g/dL (ref 3.5–5.2)
BILIRUBIN DIRECT: 0.1 mg/dL (ref 0.0–0.3)
BILIRUBIN TOTAL: 0.6 mg/dL (ref 0.2–1.2)
Total Protein: 6.6 g/dL (ref 6.0–8.3)

## 2015-08-22 LAB — LIPID PANEL
CHOL/HDL RATIO: 4
Cholesterol: 189 mg/dL (ref 0–200)
HDL: 45.4 mg/dL (ref >39.00)
LDL Cholesterol: 119 mg/dL — ABNORMAL HIGH (ref 0–99)
NONHDL: 143.88
Triglycerides: 123 mg/dL (ref 0.0–149.0)
VLDL: 24.6 mg/dL (ref 0.0–40.0)

## 2015-08-22 LAB — TSH: TSH: 2.97 u[IU]/mL (ref 0.35–4.50)

## 2015-08-22 LAB — SEDIMENTATION RATE: Sed Rate: 3 mm/hr (ref 0–30)

## 2015-08-23 ENCOUNTER — Encounter: Payer: Self-pay | Admitting: Internal Medicine

## 2015-08-23 LAB — HEPATITIS C ANTIBODY: HCV Ab: NEGATIVE

## 2015-08-31 ENCOUNTER — Telehealth: Payer: Self-pay | Admitting: Internal Medicine

## 2015-08-31 ENCOUNTER — Other Ambulatory Visit: Payer: Self-pay | Admitting: Family Medicine

## 2015-08-31 MED ORDER — ATORVASTATIN CALCIUM 10 MG PO TABS: 10.0000 mg | ORAL_TABLET | Freq: Every day | ORAL | Status: DC

## 2015-08-31 NOTE — Telephone Encounter (Signed)
Pt notified of results by telephone.  See result note. 

## 2015-08-31 NOTE — Telephone Encounter (Signed)
Pt would like results of labs. Please call back. Returning your call

## 2015-09-10 ENCOUNTER — Telehealth: Payer: Self-pay | Admitting: General Practice

## 2015-09-10 ENCOUNTER — Encounter: Payer: Self-pay | Admitting: Internal Medicine

## 2015-09-10 NOTE — Telephone Encounter (Signed)
bp seem s ok  Stay on same medication as planned  Please send in refills  Enough for  1 year

## 2015-09-10 NOTE — Telephone Encounter (Signed)
I dont see a message attached to this  Document please advise

## 2015-09-11 ENCOUNTER — Other Ambulatory Visit: Payer: Self-pay | Admitting: General Practice

## 2015-09-11 MED ORDER — VALSARTAN-HYDROCHLOROTHIAZIDE 160-12.5 MG PO TABS: 1.0000 | ORAL_TABLET | Freq: Every day | ORAL | 3 refills | Status: DC

## 2015-09-11 NOTE — Telephone Encounter (Signed)
Opened in error

## 2015-09-12 ENCOUNTER — Other Ambulatory Visit: Payer: Self-pay | Admitting: Internal Medicine

## 2015-09-14 NOTE — Telephone Encounter (Signed)
FILLED ON 09/11/2015 FOR 1 YEAR.  PLEASE CHECK FILE.

## 2015-10-09 ENCOUNTER — Ambulatory Visit (INDEPENDENT_AMBULATORY_CARE_PROVIDER_SITE_OTHER): Payer: Medicare Other | Admitting: Internal Medicine

## 2015-10-09 ENCOUNTER — Encounter: Payer: Self-pay | Admitting: Internal Medicine

## 2015-10-09 VITALS — BP 144/94 | HR 73 | Temp 98.5°F

## 2015-10-09 DIAGNOSIS — R21 Rash and other nonspecific skin eruption: Secondary | ICD-10-CM

## 2015-10-09 DIAGNOSIS — W57XXXA Bitten or stung by nonvenomous insect and other nonvenomous arthropods, initial encounter: Secondary | ICD-10-CM

## 2015-10-09 DIAGNOSIS — T148 Other injury of unspecified body region: Secondary | ICD-10-CM | POA: Diagnosis not present

## 2015-10-09 DIAGNOSIS — I10 Essential (primary) hypertension: Secondary | ICD-10-CM | POA: Diagnosis not present

## 2015-10-09 MED ORDER — FLUOCINONIDE-E 0.05 % EX CREA: 1.0000 "application " | TOPICAL_CREAM | Freq: Two times a day (BID) | CUTANEOUS | 0 refills | Status: DC

## 2015-10-09 NOTE — Patient Instructions (Addendum)
Uncertain cause of why your rash is continuing. But I agree it appears as  a reaction to something on your skin. Doesn't really look like infection. Begin topical stronger cortisone prescription cream to the area twice a day. Can use a topical antibiotic such as Polysporin on the 1 lesion that is bigger. In case there bacteria involved. Expect improvement in them that in the next 7-10 days. If spreading are not improving contact us for reevaluation.

## 2015-10-09 NOTE — Progress Notes (Signed)
Chief Complaint  Patient presents with  . Skin Problem    Rt Arm.  Insect bite or rash    HPI: Cheryl Robbins 70 y.o.  Comes in  Walk in  Assessment and appt because has itchy rash spreading on left forearm  For almost 2 weeks   Onset with round bite  After outside and now has   Spots around this     No fever streaking   Some stinging on one lesion . Had a brush scratch on forearm  In the past.She reports that the whole area of rash is extremely pruritic and taking Benadryl at night to say she doesn't scratch it. Over-the-counter hydrocortisone not helpful. She is on low-dose doxycycline for conjunctivitis blepharitis. No fever tends to get large reactions to insect bites that are late. No recent new exposures and doesn't know why the dots are coming up around the original rash. No pets no exposures to tinea. ROS: See pertinent positives and negatives per HPI.  Past Medical History:  Diagnosis Date  . Allergy   . GERD (gastroesophageal reflux disease)   . Headache(784.0)   . Hyperlipidemia   . Hypertension   . Low back pain   . Osteopenia   . 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 Primiparous   . Rosacea     Family History  Problem Relation Age of Onset  . Atrial fibrillation Mother     had cv in her 29 s  . Arthritis Mother     had staph infection knee replacment  . Cancer Sister     thryoid  . Fibromyalgia Brother   . Colon cancer Neg Hx   . Rectal cancer Neg Hx   . Stomach cancer Neg Hx     Social History   Social History  . Marital status: Married    Spouse name: N/A  . Number of children: N/A  . Years of education: N/A   Social History Main Topics  . Smoking status: Never Smoker  . Smokeless tobacco: Never Used  . Alcohol use No  . Drug use: No  . Sexual activity: Not on file   Other Topics Concern  . Not on file   Social History Narrative   hh of 1   No pets   No ets.   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 acetaminophen (TYLENOL) 500 MG tablet Take 1,000 mg by mouth every 6 (six) hours as needed.    Marland Kitchen atorvastatin (LIPITOR) 10 MG tablet Take 1 tablet (10 mg total) by mouth daily. 90 tablet 0  . beta carotene w/minerals (OCUVITE) tablet Take 1 tablet by mouth daily.    . Calcium Carbonate-Vitamin D (CALTRATE 600+D) 600-400 MG-UNIT per tablet Take 1 tablet by mouth daily.    Marland Kitchen co-enzyme Q-10 50 MG capsule Take 150 mg by mouth daily.     . diphenhydrAMINE (BENADRYL) 25 MG tablet Take 25 mg by mouth every 6 (six) hours as needed.    Marland Kitchen guaiFENesin (MUCINEX) 600 MG 12 hr tablet Take by mouth 2 (two) times daily.    Marland Kitchen loratadine (CLARITIN) 10 MG tablet Take 10 mg by  mouth daily.    . simvastatin (ZOCOR) 20 MG tablet Take 1 tablet (20 mg total) by mouth at bedtime. 90 tablet 1  . valsartan-hydrochlorothiazide (DIOVAN-HCT) 160-12.5 MG tablet Take 1 tablet by mouth daily. 90 tablet 3   No facility-administered medications prior to visit.      EXAM:  BP (!) 144/94 (BP Location: Left Arm, Cuff Size: Normal)   Pulse 73   Temp 98.5 F (36.9 C) (Oral)   There is no height or weight on file to calculate BMI.  GENERAL: vitals reviewed and listed above, alert, oriented, appears well hydrated and in no acute distress HEENT: atraumatic, conjunctiva  A bit pink  no obvious abnormalities on inspection of external nose and ears MS: moves all extremities without noticeable focal  Abnormality Skin : To ovoid patches 1 confluent somewhat raised pruritic appearing no discharge another like a ring lesion skin creases are intact and no scaling. There are multiple pinpoint pink papules surrounding this area without streaking vesicles crusting or pus. This is all on the right  forearm. PSYCH: pleasant and cooperative, no obvious depression or anxiety  ASSESSMENT AND PLAN:  Discussed the following assessment and plan:  Rash, skin  Insect bite  Essential hypertension - Up today walking visit monitor to make sure is coming down. Not certain if this was a bite or another type of insult is been going on for a few weeks is very pruritic less likely to be infection. That the one lesions has skin lines like a granuloma annulare. Discussed differential diagnosis will use high-dose steroids at this time treat topically 4 potential secondary infection if appropriate. If persistent progresses over the next 1-2 weeks without improvement contact us for re-eval and may beg dermatology to check. -Patient advised to return or notify health care team  if symptoms worsen ,persist or new concerns arise.  Patient Instructions  Uncertain cause of why your rash is continuing. But I agree it appears as  a reaction to something on your skin. Doesn't really look like infection. Begin topical stronger cortisone prescription cream to the area twice a day. Can use a topical antibiotic such as Polysporin on the 1 lesion that is bigger. In case there bacteria involved. Expect improvement in them that in the next 7-10 days. If spreading are not improving contact us for reevaluation.    Standley Brooking. Sharmon Cheramie M.D.

## 2015-10-09 NOTE — Progress Notes (Signed)
Pre visit review using our clinic review tool, if applicable. No additional management support is needed unless otherwise documented below in the visit note. 

## 2015-12-04 ENCOUNTER — Other Ambulatory Visit: Payer: Self-pay | Admitting: Internal Medicine

## 2015-12-05 NOTE — Telephone Encounter (Signed)
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

## 2015-12-21 NOTE — Progress Notes (Signed)
Pre visit review using our clinic review tool, if applicable. No additional management support is needed unless otherwise documented below in the visit note.  Chief Complaint  Patient presents with  . Follow-up    HPI: Cheryl Robbins 70 y.o.  Fu   bp lipids etc  Has been busy taking care of her great grandchild 58 pounds 26-month-old. Has had some body aches upper and lower wasn't sure if it was from the atorvastatin so she stopped the atorvastatin and soon later her upper body aches went away he still has low back pain that goes around the thighs like a bursitis. Otherwise she is doing as expected taking blood pressure medication. ROS: See pertinent positives and negatives per HPI. No cp sob  Coping  Still wodowed   Past Medical History:  Diagnosis Date  . Allergy   . GERD (gastroesophageal reflux disease)   . Headache(784.0)   . Hyperlipidemia   . Hypertension   . Low back pain   . Osteopenia   . 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 Primiparous   . Rosacea     Family History  Problem Relation Age of Onset  . Atrial fibrillation Mother     had cv in her 36 s  . Arthritis Mother     had staph infection knee replacment  . Cancer Sister     thryoid  . Fibromyalgia Brother   . Colon cancer Neg Hx   . Rectal cancer Neg Hx   . Stomach cancer Neg Hx     Social History   Social History  . Marital status: Married    Spouse name: N/A  . Number of children: N/A  . Years of education: N/A   Social History Main Topics  . Smoking status: Never Smoker  . Smokeless tobacco: Never Used  . Alcohol use No  . Drug use: No  . Sexual activity: Not Asked   Other Topics Concern  . None   Social History Narrative   hh of 1   No pets   No ets.   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 acetaminophen (TYLENOL) 500 MG tablet Take 1,000 mg by mouth every 6 (six) hours as needed.    . beta carotene w/minerals (OCUVITE) tablet Take 1 tablet by mouth daily.    . Calcium Carbonate-Vitamin D (CALTRATE 600+D) 600-400 MG-UNIT per tablet Take 1 tablet by mouth daily.    Marland Kitchen co-enzyme Q-10 50 MG capsule Take 150 mg by mouth daily.     . diphenhydrAMINE (BENADRYL) 25 MG tablet Take 25 mg by mouth every 6 (six) hours as needed.    Marland Kitchen guaiFENesin (MUCINEX) 600 MG 12 hr tablet Take by mouth 2 (two) times daily.    Marland Kitchen loratadine (CLARITIN) 10 MG tablet Take 10 mg by mouth daily.    . valsartan-hydrochlorothiazide (DIOVAN-HCT) 160-12.5 MG tablet Take 1 tablet by mouth daily. 90 tablet 3  . atorvastatin (LIPITOR) 10 MG tablet take 1 tablet by mouth once daily (Patient not taking: Reported on 12/24/2015) 90 tablet 3  . fluocinonide-emollient (LIDEX-E) 0.05 % cream Apply 1 application topically 2 (two) times daily. (Patient not  taking: Reported on 12/24/2015) 30 g 0  . simvastatin (ZOCOR) 20 MG tablet Take 1 tablet (20 mg total) by mouth at bedtime. 90 tablet 1   No facility-administered medications prior to visit.      EXAM:  BP (!) 142/80 (BP Location: Right Arm, Cuff Size: Normal)   Temp 98.6 F (37 C) (Oral)   Wt 157 lb (71.2 kg)   BMI 26.95 kg/m   Body mass index is 26.95 kg/m. bp rechecked twice  GENERAL: vitals reviewed and listed above, alert, oriented, appears well hydrated and in no acute distress HEENT: atraumatic, conjunctiva  clear, no obvious abnormalities on inspection of external nose and ears NECK: no obvious masses on inspection palpation  CV: HRRR, no clubbing cyanosis or  peripheral edema nl cap refill  MS: moves all extremities without noticeable focal  abnormality PSYCH: pleasant and cooperative, no obvious depression or anxiety emotional at times disc husband but well  Lab Results   Component Value Date   WBC 5.1 08/22/2015   HGB 14.6 08/22/2015   HCT 42.7 08/22/2015   PLT 216.0 08/22/2015   GLUCOSE 90 08/22/2015   CHOL 189 08/22/2015   TRIG 123.0 08/22/2015   HDL 45.40 08/22/2015   LDLDIRECT 171.0 06/17/2006   LDLCALC 119 (H) 08/22/2015   ALT 14 08/22/2015   AST 11 08/22/2015   NA 132 (L) 08/22/2015   K 4.4 08/22/2015   CL 95 (L) 08/22/2015   CREATININE 0.81 08/22/2015   BUN 11 08/22/2015   CO2 31 08/22/2015   TSH 2.97 08/22/2015   HGBA1C 5.3 10/16/2014    ASSESSMENT AND PLAN:  Discussed the following assessment and plan:  Hyperlipidemia, unspecified hyperlipidemia type - ? if se of lipitor 10 will retry  december and then lab and fu in 3 months later   Essential hypertension  Body aches - better after stopping lipitor but also poss related to ms problems and  new baby caretaking wlll rechallenge  Will conact  [place] if cannot take med in interim  -Patient advised to return or notify health care team  if symptoms worsen ,persist or new concerns arise.  Patient Instructions  Stay off the Lipitor for another month. Follows okay as we discussed can restart it and if you are getting total body aches again contact us if this is happening and we can make other plans. There are other statin medicines we can try. If you are able to remain on the Lipitor we will plan cholesterol lipid tests in about 3 months. Repeat blood pressure is better 128/72 and 142/80.   Make sure you were taking the equivalent of 800 with thousand units of vitamin D a day.    Standley Brooking. Colm Lyford M.D.

## 2015-12-24 ENCOUNTER — Ambulatory Visit (INDEPENDENT_AMBULATORY_CARE_PROVIDER_SITE_OTHER): Payer: Medicare Other | Admitting: Internal Medicine

## 2015-12-24 ENCOUNTER — Encounter: Payer: Self-pay | Admitting: Internal Medicine

## 2015-12-24 VITALS — BP 142/80 | Temp 98.6°F | Wt 157.0 lb

## 2015-12-24 DIAGNOSIS — R52 Pain, unspecified: Secondary | ICD-10-CM

## 2015-12-24 DIAGNOSIS — E785 Hyperlipidemia, unspecified: Secondary | ICD-10-CM

## 2015-12-24 DIAGNOSIS — I10 Essential (primary) hypertension: Secondary | ICD-10-CM

## 2015-12-24 NOTE — Patient Instructions (Signed)
Stay off the Lipitor for another month. Follows okay as we discussed can restart it and if you are getting total body aches again contact us if this is happening and we can make other plans. There are other statin medicines we can try. If you are able to remain on the Lipitor we will plan cholesterol lipid tests in about 3 months. Repeat blood pressure is better 128/72 and 142/80.   Make sure you were taking the equivalent of 800 with thousand units of vitamin D a day.

## 2016-01-14 LAB — HM MAMMOGRAPHY

## 2016-01-16 ENCOUNTER — Encounter: Payer: Self-pay | Admitting: Family Medicine

## 2016-04-21 ENCOUNTER — Other Ambulatory Visit: Payer: Self-pay | Admitting: Emergency Medicine

## 2016-04-21 DIAGNOSIS — E785 Hyperlipidemia, unspecified: Secondary | ICD-10-CM

## 2016-04-22 ENCOUNTER — Other Ambulatory Visit (INDEPENDENT_AMBULATORY_CARE_PROVIDER_SITE_OTHER): Payer: Medicare Other

## 2016-04-22 DIAGNOSIS — E785 Hyperlipidemia, unspecified: Secondary | ICD-10-CM

## 2016-04-22 DIAGNOSIS — I10 Essential (primary) hypertension: Secondary | ICD-10-CM

## 2016-04-22 LAB — BASIC METABOLIC PANEL
BUN: 12 mg/dL (ref 6–23)
CALCIUM: 9.2 mg/dL (ref 8.4–10.5)
CO2: 33 meq/L — AB (ref 19–32)
CREATININE: 0.8 mg/dL (ref 0.40–1.20)
Chloride: 100 mEq/L (ref 96–112)
GFR: 75.03 mL/min (ref >60.00)
Glucose, Bld: 96 mg/dL (ref 70–99)
Potassium: 4.4 mEq/L (ref 3.5–5.1)
SODIUM: 138 meq/L (ref 135–145)

## 2016-04-22 LAB — LIPID PANEL
CHOL/HDL RATIO: 3
Cholesterol: 128 mg/dL (ref 0–200)
HDL: 47.1 mg/dL (ref >39.00)
LDL CALC: 64 mg/dL (ref 0–99)
NONHDL: 81.35
Triglycerides: 89 mg/dL (ref 0.0–149.0)
VLDL: 17.8 mg/dL (ref 0.0–40.0)

## 2016-04-28 ENCOUNTER — Encounter: Payer: Self-pay | Admitting: Internal Medicine

## 2016-04-28 ENCOUNTER — Ambulatory Visit (INDEPENDENT_AMBULATORY_CARE_PROVIDER_SITE_OTHER): Payer: Medicare Other | Admitting: Internal Medicine

## 2016-04-28 VITALS — BP 126/74 | HR 82 | Temp 98.3°F | Ht 64.0 in | Wt 158.1 lb

## 2016-04-28 DIAGNOSIS — M25552 Pain in left hip: Secondary | ICD-10-CM | POA: Diagnosis not present

## 2016-04-28 DIAGNOSIS — I10 Essential (primary) hypertension: Secondary | ICD-10-CM

## 2016-04-28 DIAGNOSIS — M545 Low back pain, unspecified: Secondary | ICD-10-CM

## 2016-04-28 DIAGNOSIS — M25551 Pain in right hip: Secondary | ICD-10-CM | POA: Diagnosis not present

## 2016-04-28 DIAGNOSIS — Z8612 Personal history of poliomyelitis: Secondary | ICD-10-CM

## 2016-04-28 DIAGNOSIS — E785 Hyperlipidemia, unspecified: Secondary | ICD-10-CM | POA: Diagnosis not present

## 2016-04-28 DIAGNOSIS — Z79899 Other long term (current) drug therapy: Secondary | ICD-10-CM | POA: Diagnosis not present

## 2016-04-28 MED ORDER — PROMETHAZINE HCL 12.5 MG RE SUPP: 12.5000 mg | RECTAL | 2 refills | Status: DC | PRN

## 2016-04-28 NOTE — Assessment & Plan Note (Signed)
On s=injections

## 2016-04-28 NOTE — Progress Notes (Signed)
Pre visit review using our clinic review tool, if applicable. No additional management support is needed unless otherwise documented below in the visit note. 

## 2016-04-28 NOTE — Patient Instructions (Addendum)
Blood work much improved .   And BP is better .   Continue same medication.   Doubt the lipitor is causing . The back hip pain but can try off and on .  Will arrange  A sports medicine  appt about the back and hip to see what you can do to help the  Pain discomfort.   Will refill the phenergan  supp to use as needed.   I think the area is a fat pad  Or benign lipoma  No hernia or  Lymph gland   Noted.   Let me check the are at your check up or if growing or painful .

## 2016-04-28 NOTE — Progress Notes (Signed)
Chief Complaint  Patient presents with  . Follow-up    lipids, BMP, pt needs a refill of phenegran12.5mg     HPI: Cheryl Robbins 71 y.o. come in for Chronic disease management   Lipids  Had se of simva    On atorva   10 mg   Has lbp and lateral hip pain and "lives on a heating pad"  Some before med   No fever myalgias  .  Some lifting 22 # GC  64 mos old no injury . Sleep disturbed with layin g on hip.   Bp high normal ok on med  Mood some sad days  But usually passes  1 year anniv of moms death . Doesn't feel stuck on grief   Requests refill phenergan supp uses as needed for migraine  And vomiting  minimal use .but old med expired   Check lump area left and suprapubic area .    ROS: See pertinent positives and negatives per HPI. No cp sob falling   Past Medical History:  Diagnosis Date  . Allergy   . GERD (gastroesophageal reflux disease)   . Headache(784.0)   . Hyperlipidemia   . Hypertension   . Low back pain   . Osteopenia   . 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 Primiparous   . Rosacea     Family History  Problem Relation Age of Onset  . Atrial fibrillation Mother     had cv in her 95 s  . Arthritis Mother     had staph infection knee replacment  . Cancer Sister     thryoid  . Fibromyalgia Brother   . Colon cancer Neg Hx   . Rectal cancer Neg Hx   . Stomach cancer Neg Hx     Social History   Social History  . Marital status: Married    Spouse name: N/A  . Number of children: N/A  . Years of education: N/A   Social History Main Topics  . Smoking status: Never Smoker  . Smokeless tobacco: Never Used  . Alcohol use No  . Drug use: No  . Sexual activity: Not Asked   Other Topics Concern  . None   Social History Narrative   hh of 1   No pets   No ets.   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 acetaminophen (TYLENOL) 500 MG tablet Take 1,000 mg by mouth every 6 (six) hours as needed.    Marland Kitchen atorvastatin (LIPITOR) 10 MG tablet take 1 tablet by mouth once daily 90 tablet 3  . beta carotene w/minerals (OCUVITE) tablet Take 1 tablet by mouth daily.    . Calcium Carbonate-Vitamin D (CALTRATE 600+D) 600-400 MG-UNIT per tablet Take 1 tablet by mouth daily.    Marland Kitchen co-enzyme Q-10 50 MG capsule Take 150 mg by mouth daily.     . diphenhydrAMINE (BENADRYL) 25 MG tablet Take 25 mg by mouth every 6 (six) hours as needed.    Mariane Baumgarten Sodium (COLACE PO) Take by mouth.    . fluocinonide-emollient (LIDEX-E) 0.05 % cream Apply 1 application topically 2 (two) times daily. 30 g 0  .  fluticasone (FLONASE) 50 MCG/ACT nasal spray Place into both nostrils daily.    Marland Kitchen guaiFENesin (MUCINEX) 600 MG 12 hr tablet Take by mouth 2 (two) times daily.    Marland Kitchen loratadine (CLARITIN) 10 MG tablet Take 10 mg by mouth daily.    Marland Kitchen omeprazole (PRILOSEC) 20 MG capsule Take 20 mg by mouth daily.    . valsartan-hydrochlorothiazide (DIOVAN-HCT) 160-12.5 MG tablet Take 1 tablet by mouth daily. 90 tablet 3  . Promethazine HCl (PHENERGAN PO) Take by mouth.    . bacitracin ophthalmic ointment 1 application 4 (four) times daily. apply to eye    . doxycycline (VIBRAMYCIN) 100 MG capsule Take 100 mg by mouth 2 (two) times daily.    . Probiotic Product (TRUBIOTICS PO) Take by mouth.     No facility-administered medications prior to visit.      EXAM:  BP 126/74 (BP Location: Left Arm, Patient Position: Sitting, Cuff Size: Normal)   Pulse 82   Temp 98.3 F (36.8 C) (Oral)   Ht 5\' 4"  (1.626 m)   Wt 158 lb 1.6 oz (71.7 kg)   SpO2 99%   BMI 27.14 kg/m   Body mass index is 27.14 kg/m.  GENERAL: vitals reviewed and listed above, alert, oriented, appears well hydrated and in no acute distress HEENT:  atraumatic, conjunctiva  clear, no obvious abnormalities on inspection of external nose and ears MS: moves all extremities without noticeable focal  Abnormality gait mildly antalgic   Nl and no obv focal deficietn  abd left sp fat pad promince ? Lipoma  No hernia or adenopathy or tenderesnss PSYCH: pleasant and cooperative, no obvious depression or anxiety Lab Results  Component Value Date   WBC 5.1 08/22/2015   HGB 14.6 08/22/2015   HCT 42.7 08/22/2015   PLT 216.0 08/22/2015   GLUCOSE 96 04/22/2016   CHOL 128 04/22/2016   TRIG 89.0 04/22/2016   HDL 47.10 04/22/2016   LDLDIRECT 171.0 06/17/2006   LDLCALC 64 04/22/2016   ALT 14 08/22/2015   AST 11 08/22/2015   NA 138 04/22/2016   K 4.4 04/22/2016   CL 100 04/22/2016   CREATININE 0.80 04/22/2016   BUN 12 04/22/2016   CO2 33 (H) 04/22/2016   TSH 2.97 08/22/2015   HGBA1C 5.3 10/16/2014   BP Readings from Last 3 Encounters:  04/28/16 126/74  12/24/15 (!) 142/80  10/09/15 (!) 144/94    ASSESSMENT AND PLAN:  Discussed the following assessment and plan:  Hyperlipidemia, unspecified hyperlipidemia type  Essential hypertension  Medication management  POLIOMYELITIS, HX OF - Plan: Ambulatory referral to Sports Medicine  Bilateral low back pain without sciatica, unspecified chronicity - Plan: Ambulatory referral to Sports Medicine  Pain of both hip joints - Plan: Ambulatory referral to Sports Medicine  -Patient advised to return or notify health care team  if  new concerns arise.  Patient Instructions  Blood work much improved .   And BP is better .   Continue same medication.   Doubt the lipitor is causing . The back hip pain but can try off and on .  Will arrange  A sports medicine  appt about the back and hip to see what you can do to help the  Pain discomfort.   Will refill the phenergan  supp to use as needed.   I think the area is a fat pad  Or benign lipoma  No hernia or  Lymph gland   Noted.   Let me check  the are at  your check up or if growing or painful .          Standley Brooking. Gerene Nedd M.D.

## 2016-04-29 ENCOUNTER — Telehealth: Payer: Self-pay

## 2016-04-29 NOTE — Telephone Encounter (Signed)
Received PA request from [facility] for Promethazine suppositories. PA submitted & is pending. Key: [id number]

## 2016-04-30 NOTE — Telephone Encounter (Signed)
NO the question about sadness   Is not the reason  you have appt with SUSAN   This appt is for "Medicare Wellness visit"  Which reviews all of the wellness  Parameters. Guidelines   .  At this time staff are being asked to do a depression ?  For all patients  And for  Medicare  And insurance Quality parameters and that is why you got asked the ?   So that is new    But we already discussed this  At  [place] visit.

## 2016-05-01 NOTE — Telephone Encounter (Signed)
PA denied.   It is not supported by the FDA or by one of the Medicare approved references for treating: nausea & diarrhea.

## 2016-05-05 NOTE — Telephone Encounter (Signed)
Appeal has been done.

## 2016-05-05 NOTE — Telephone Encounter (Signed)
Please try again  The reason for this is for migraine HEADACHES with nausea

## 2016-05-05 NOTE — Telephone Encounter (Signed)
FYI

## 2016-05-05 NOTE — Telephone Encounter (Signed)
fyi

## 2016-05-12 NOTE — Progress Notes (Signed)
[name] [address], [place] [address] Phone: [phone] Subjective:    I'm seeing this patient by the request  of:  Lottie Dawson, MD   CC: Low back pain  [id number]  Cheryl Robbins is a 71 y.o. female coming in with complaint of low back pain. Patient states she is been told that she's had scoliosis before..Patient is a most the pain seems to be on the greater trochanteric areas bilaterally. Seems to stay localized. Does have some mild back pain as well. States that there is some very mild discomfort in the buttocks but never seems to radiate down the legs. Denies any numbness. States that it does seem to be worse at night. Patient states that this pain has started her decreasing her activity somewhat. Different modalities she has tried is things over-the-counter as well as CT with mild improvement. Denies any true injury. Rates the severity of pain is 4 through 7 out of 10.      Past Medical History:  Diagnosis Date  . Allergy   . GERD (gastroesophageal reflux disease)   . Headache(784.0)   . Hyperlipidemia   . Hypertension   . Low back pain   . Osteopenia   . 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 Primiparous   . Rosacea    Past Surgical History:  Procedure Laterality Date  . atypical moles    . 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 COLONOSCOPY    . OOPHORECTOMY    . tonsilletomy  1953   Social History   Social History  . Marital status: Married    Spouse name: N/A  . Number of children: N/A  . Years of education: N/A   Social History Main Topics  . Smoking status: Never Smoker  . Smokeless tobacco: Never Used  . Alcohol use No  . Drug use: No  . Sexual activity: Not Asked   Other Topics Concern  . None   Social History Narrative   hh of 1   No pets   No ets.   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 Penicillins     Gums swelled up and turned blue  . Ace Inhibitors Cough    Went away when med changed   . Clindamycin/Lincomycin     Had rash with diflucan and clind but cant tell which one it was  As per dr Ernesto Rutherford  . Sulfonamide Derivatives   . Simvastatin Other (See Comments)    Body leg aches  On simva and doxy better off see July 17 note   Family History  Problem Relation Age of Onset  . Atrial fibrillation Mother     had cv in her 67 s  . Arthritis Mother     had staph infection knee replacment  . Cancer Sister     thryoid  . Fibromyalgia Brother   . Colon cancer Neg Hx   . Rectal cancer Neg Hx   . Stomach cancer Neg Hx     Past medical history, social, surgical and family history all reviewed in electronic medical record.  No pertanent information  unless stated regarding to the chief complaint.   Review of Systems:Review of systems updated and as accurate as of 05/13/16  No headache, visual changes, nausea, vomiting, diarrhea, constipation, dizziness, abdominal pain, skin rash, fevers, chills, night sweats, weight loss, swollen lymph nodes, body aches, joint swelling, muscle aches, chest pain, shortness of breath, mood changes.   Objective  Blood pressure (!) 150/84, pulse 96, height 5\' 4"  (1.626 m), weight 157 lb 12.8 oz (71.6 kg), SpO2 98 %. Systems examined below as of 05/13/16   General: No apparent distress alert and oriented x3 mood and affect normal, dressed appropriately.  HEENT: Pupils equal, extraocular movements intact  Respiratory: Patient's speak in full sentences and does not appear short of breath  Cardiovascular: No lower extremity edema, non tender, no erythema  Skin: Warm dry intact with no signs of infection or rash on extremities or on axial skeleton.  Abdomen: Soft  nontender  Neuro: Cranial nerves II through XII are intact, neurovascularly intact in all extremities with 2+ DTRs and 2+ pulses.  Lymph: No lymphadenopathy of posterior or anterior cervical chain or axillae bilaterally.  Gait normal with good balance and coordination.  MSK:  Non tender with full range of motion and good stability and symmetric strength and tone of shoulders, elbows, wrist, hip, knee and ankles bilaterally. Very mild arthritic changes of multiple joints Back Exam:  Inspection: Unremarkable  Motion: Flexion 40 deg, Extension 25 deg, Side Bending to 35 deg bilaterally,  Rotation to 35 deg bilaterally  SLR laying: Negative  XSLR laying: Negative  Palpable tenderness: Tender to palpation of the paraspinal musculature of the lumbar spine but more tenderness over the greater trochanteric area bilaterally left greater than right. FABER: Mild tightness bilaterally. Sensory change: Gross sensation intact to all lumbar and sacral dermatomes.  Reflexes: 2+ at both patellar tendons, 2+ at achilles tendons, Babinski's downgoing.  Strength at foot  Plantar-flexion: 5/5 Dorsi-flexion: 5/5 Eversion: 5/5 Inversion: 5/5  Leg strength  Quad: 5/5 Hamstring: 5/5 Hip flexor: 5/5 Hip abductors: 5/5  Gait unremarkable.  Procedure note 21194; 15 minutes spent for Therapeutic exercises as stated in above notes.  This included exercises focusing on stretching, strengthening, with significant focus on eccentric aspects.Low back exercises that included:  Pelvic tilt/bracing instruction to focus on control of the pelvic girdle and lower abdominal muscles  Glute strengthening exercises, focusing on proper firing of the glutes without engaging the low back muscles Proper stretching techniques for maximum relief for the hamstrings, hip flexors, low back and some rotation where tolerated     Proper technique shown and discussed handout in great detail with ATC.  All questions were discussed and answered.       Impression and Recommendations:     This case required medical decision making of moderate complexity.      Note: This dictation was prepared with Dragon dictation along with smaller phrase technology. Any transcriptional errors that result from this process are unintentional.

## 2016-05-13 ENCOUNTER — Encounter: Payer: Self-pay | Admitting: Family Medicine

## 2016-05-13 ENCOUNTER — Ambulatory Visit (INDEPENDENT_AMBULATORY_CARE_PROVIDER_SITE_OTHER)
Admission: RE | Admit: 2016-05-13 | Discharge: 2016-05-13 | Disposition: A | Discharge status: Home / Self Care | Payer: Medicare Other | Source: Ambulatory Visit | Attending: Family Medicine | Admitting: Family Medicine

## 2016-05-13 ENCOUNTER — Ambulatory Visit (INDEPENDENT_AMBULATORY_CARE_PROVIDER_SITE_OTHER): Payer: Medicare Other | Admitting: Family Medicine

## 2016-05-13 VITALS — BP 150/84 | HR 96 | Ht 64.0 in | Wt 157.8 lb

## 2016-05-13 DIAGNOSIS — M7061 Trochanteric bursitis, right hip: Secondary | ICD-10-CM

## 2016-05-13 DIAGNOSIS — M545 Low back pain: Secondary | ICD-10-CM

## 2016-05-13 DIAGNOSIS — M7062 Trochanteric bursitis, left hip: Secondary | ICD-10-CM | POA: Diagnosis not present

## 2016-05-13 MED ORDER — GABAPENTIN 100 MG PO CAPS: 200.0000 mg | ORAL_CAPSULE | Freq: Every day | ORAL | 3 refills | Status: DC

## 2016-05-13 NOTE — Patient Instructions (Addendum)
Good to see you  Xray downstairs Ice 20 minutes 2 times daily. Usually after activity and before bed. Exercises 3 times a week.  pennsaid pinkie amount topically 2 times daily as needed.  Gabapentin 100mg  at night, if not working after 3 nights then go up to 200mg  at night Tylenol 650mg  3 times daily  Turmeric 500mg  daily  Vitamin D 2000 IU daily  See me again in 3-5 weeks and if not better we will do injection.

## 2016-05-13 NOTE — Assessment & Plan Note (Signed)
Exercises given \\X -rays pending Return in 4 weeks

## 2016-05-13 NOTE — Assessment & Plan Note (Signed)
Patient does have what appears to be more of a trochanteric bursitis. Patient given topical anti-inflammatories, home exercises, icing regimen, which activities to do a which ones to avoid. Discussed with patient at great length. Patient will make changes and come back in 4 weeks. Worsening symptoms we'll consider injection.

## 2016-05-19 NOTE — Telephone Encounter (Signed)
Approved through 02/16/17. Form faxed back to pharmacy.

## 2016-06-15 ENCOUNTER — Encounter: Payer: Self-pay | Admitting: Family Medicine

## 2016-06-19 ENCOUNTER — Ambulatory Visit: Payer: Medicare Other | Admitting: Family Medicine

## 2016-06-22 NOTE — Progress Notes (Signed)
[name] [place] [place], [place] [address] Phone: [phone] Subjective:    I'm seeing this patient by the request  of:  Panosh, Standley Brooking, MD   CC: Low back pain f/u  [id number]  Cheryl Robbins is a 71 y.o. female coming in with complaint of low back pain. Patient states she is been told that she's had scoliosis before..Patient is a most the pain seems to be on the greater trochanteric areas bilaterally. Patient seen by me one and try conservative therapy first. Patient was started on topical anti-inflammatories, home exercises and icing protocol. An states that she has not made any significant for. Seems to be getting more pain the lateral hip waking her up at night. Affecting her taking care of her grandchildren.      Past Medical History:  Diagnosis Date  . Allergy   . GERD (gastroesophageal reflux disease)   . Headache(784.0)   . Hyperlipidemia   . Hypertension   . Low back pain   . Osteopenia   . 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 Primiparous   . Rosacea    Past Surgical History:  Procedure Laterality Date  . atypical moles    . bilateral salpingoopherectomy     for r 4cm clear r adnexal mass 2002-06-10 serous cystadenoma  . COLONOSCOPY    . OOPHORECTOMY    . tonsilletomy  1953   Social History   Social History  . Marital status: Married    Spouse name: N/A  . Number of children: N/A  . Years of education: N/A   Social History Main Topics  . Smoking status: Never Smoker  . Smokeless tobacco: Never Used  . Alcohol use No  . Drug use: No  . Sexual activity: Not Asked   Other Topics Concern  . None   Social History Narrative   hh of 1   No pets   No ets.   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 Penicillins     Gums swelled up and turned blue  . Ace Inhibitors Cough    Went away when med changed   . Clindamycin/Lincomycin     Had rash with diflucan and clind but cant tell which one it was  As per dr Ernesto Rutherford  . Sulfonamide Derivatives   . Simvastatin Other (See Comments)    Body leg aches  On simva and doxy better off see July 17 note   Family History  Problem Relation Age of Onset  . Atrial fibrillation Mother     had cv in her 59 s  . Arthritis Mother     had staph infection knee replacment  . Cancer Sister     thryoid  . Fibromyalgia Brother   . Colon cancer Neg Hx   . Rectal cancer Neg Hx   . Stomach cancer Neg Hx     Past medical history, social, surgical and family history all reviewed in electronic medical record.  No pertanent information unless stated regarding to the chief complaint.   Review of Systems: No headache, visual changes, nausea, vomiting, diarrhea, constipation, dizziness, abdominal pain, skin rash, fevers, chills, night sweats, weight loss, swollen  lymph nodes,chest pain, shortness of breath, mood changes.  Positive body aches and muscle aches   Objective  Blood pressure 138/88, pulse 81, resp. rate 20, weight 159 lb (72.1 kg), SpO2 98 %.   Systems examined below as of 06/23/16 General: NAD A&O x3 mood, affect normal  HEENT: Pupils equal, extraocular movements intact no nystagmus Respiratory: not short of breath at rest or with speaking Cardiovascular: No lower extremity edema, non tender Skin: Warm dry intact with no signs of infection or rash on extremities or on axial skeleton. Abdomen: Soft nontender, no masses Neuro: Cranial nerves  intact, neurovascularly intact in all extremities with 2+ DTRs and 2+ pulses. Lymph: No lymphadenopathy appreciated today  Gait normal with good balance and coordination.  MSK: Non tender with full range of motion and good stability and symmetric strength and tone of shoulders, elbows, wrist,   knee and ankles bilaterally.   Back Exam:  Inspection: Scoliosis noted Motion: Flexion 40 deg, Extension 25 deg, Side Bending to 35 deg bilaterally,  Rotation to 30 deg bilaterally mild worsening range of motion SLR laying: Negative  XSLR laying: Negative  Palpable tenderness: Severe tenderness to palpation over the greater trochanteric area bilaterally. FABER: Mild tightness bilaterally. Sensory change: Gross sensation intact to all lumbar and sacral dermatomes.  Reflexes: 2+ at both patellar tendons, 2+ at achilles tendons, Babinski's downgoing.  Strength at foot  Plantar-flexion: 5/5 Dorsi-flexion: 5/5 Eversion: 5/5 Inversion: 5/5  Leg strength  Quad: 5/5 Hamstring: 5/5 Hip flexor: 5/5 Hip abductors: 5/5  Gait unremarkable.   Procedure: Real-time Ultrasound Guided Injection of right greater trochanteric bursitis secondary to patient's body habitus Device: GE Logiq Q7 Ultrasound guided injection is preferred based studies that show increased duration, increased effect, greater accuracy, decreased procedural pain, increased response rate, and decreased cost with ultrasound guided versus blind injection.  Verbal informed consent obtained.  Time-out conducted.  Noted no overlying erythema, induration, or other signs of local infection.  Skin prepped in a sterile fashion.  Local anesthesia: Topical Ethyl chloride.  With sterile technique and under real time ultrasound guidance:  Greater trochanteric area was visualized and patient's bursa was noted. A 22-gauge 3 inch needle was inserted and 4 cc of 0.5% Marcaine and 1 cc of Kenalog 40 mg/dL was injected. Pictures taken Completed without difficulty  Pain immediately resolved suggesting accurate placement of the medication.  Advised to call if fevers/chills, erythema, induration, drainage, or persistent bleeding.  Images permanently stored and available for review in the ultrasound unit.  Impression: Technically successful ultrasound  guided injection.   Procedure: Real-time Ultrasound Guided Injection of left  greater trochanteric bursitis secondary to patient's body habitus Device: GE Logiq Q7  Ultrasound guided injection is preferred based studies that show increased duration, increased effect, greater accuracy, decreased procedural pain, increased response rate, and decreased cost with ultrasound guided versus blind injection.  Verbal informed consent obtained.  Time-out conducted.  Noted no overlying erythema, induration, or other signs of local infection.  Skin prepped in a sterile fashion.  Local anesthesia: Topical Ethyl chloride.  With sterile technique and under real time ultrasound guidance:  Greater trochanteric area was visualized and patient's bursa was noted. A 22-gauge 3 inch needle was inserted and 4 cc of 0.5% Marcaine and 1 cc of Kenalog 40 mg/dL was injected. Pictures taken Completed without difficulty  Pain immediately resolved suggesting accurate placement of the medication.  Advised to call if fevers/chills, erythema, induration, drainage, or persistent bleeding.  Images permanently stored and available  for review in the ultrasound unit.  Impression: Technically successful ultrasound guided injection.    Impression and Recommendations:     This case required medical decision making of moderate complexity.      Note: This dictation was prepared with Dragon dictation along with smaller phrase technology. Any transcriptional errors that result from this process are unintentional.

## 2016-06-23 ENCOUNTER — Ambulatory Visit: Payer: Self-pay

## 2016-06-23 ENCOUNTER — Encounter: Payer: Self-pay | Admitting: Family Medicine

## 2016-06-23 ENCOUNTER — Ambulatory Visit (INDEPENDENT_AMBULATORY_CARE_PROVIDER_SITE_OTHER): Payer: Medicare Other | Admitting: Family Medicine

## 2016-06-23 VITALS — BP 138/88 | HR 81 | Resp 20 | Wt 159.0 lb

## 2016-06-23 DIAGNOSIS — M25559 Pain in unspecified hip: Secondary | ICD-10-CM | POA: Diagnosis not present

## 2016-06-23 DIAGNOSIS — M545 Low back pain, unspecified: Secondary | ICD-10-CM

## 2016-06-23 DIAGNOSIS — M7062 Trochanteric bursitis, left hip: Secondary | ICD-10-CM | POA: Diagnosis not present

## 2016-06-23 DIAGNOSIS — M7061 Trochanteric bursitis, right hip: Secondary | ICD-10-CM | POA: Diagnosis not present

## 2016-06-23 NOTE — Assessment & Plan Note (Signed)
Bilateral injections given today. Discussed patient about potential side effects. Patient will start increasing activity as tolerated. We discussed icing regimen, home exercises, which activities to do a which ones to avoid. Patient will try to be active otherwise. Decline formal physical therapy. Follow-up again in 4 weeks

## 2016-06-23 NOTE — Assessment & Plan Note (Signed)
History of scoliosis. Does have degenerative disc disease. We will continue to monitor closely.

## 2016-06-23 NOTE — Patient Instructions (Signed)
Good to see you  Ice is your friend  Injected bot hips today  Continue the vitamins Send me a message in 2 weeks and if not better we will get MRI of your back  Otherwise check in with me again in 6 weeks.

## 2016-07-14 ENCOUNTER — Encounter: Payer: Self-pay | Admitting: Family Medicine

## 2016-07-21 ENCOUNTER — Encounter: Payer: Self-pay | Admitting: Internal Medicine

## 2016-07-21 ENCOUNTER — Ambulatory Visit (INDEPENDENT_AMBULATORY_CARE_PROVIDER_SITE_OTHER): Payer: Medicare Other | Admitting: Internal Medicine

## 2016-07-21 VITALS — BP 140/80 | HR 80 | Temp 97.8°F | Ht 64.0 in | Wt 156.6 lb

## 2016-07-21 DIAGNOSIS — I10 Essential (primary) hypertension: Secondary | ICD-10-CM

## 2016-07-21 DIAGNOSIS — Z79899 Other long term (current) drug therapy: Secondary | ICD-10-CM

## 2016-07-21 DIAGNOSIS — R229 Localized swelling, mass and lump, unspecified: Secondary | ICD-10-CM | POA: Diagnosis not present

## 2016-07-21 DIAGNOSIS — R42 Dizziness and giddiness: Secondary | ICD-10-CM | POA: Diagnosis not present

## 2016-07-21 DIAGNOSIS — IMO0002 Reserved for concepts with insufficient information to code with codable children: Secondary | ICD-10-CM

## 2016-07-21 NOTE — Patient Instructions (Addendum)
This may be a lipoma   a bad place   Will get a  Surgery consult opinion.  About this. If rapidly growing should be removed.    Consider   Decreasing  Trial  BP meds .  Take 1/2 of the valsartan hctz pill and follow the BP .    For control.  And side effects .  Keep July follow.  Caution with gabapentin

## 2016-07-21 NOTE — Progress Notes (Signed)
Chief Complaint  Patient presents with  . Mass    on groin    . Dizziness    started two weeks     HPI: Cheryl Robbins 71 y.o.  Problem based visit    Since last visit  Had  Swelling lump suprapubic area   ? Bigger and  ? Feels dull ache sometimes . Awareness . Once in a while .  Young er relative  Had carcinoid .      Also recently had episodic dizzy like feeling  Noted her bp has been on the low side at times  Such as  108   Has allergy sx and on rx   On low dose gaba caused drowsiness at night ROS: See pertinent positives and negatives per HPI. Under rx for back and hip  Pain getting better  No fever pulm sx change bowel habits   Past Medical History:  Diagnosis Date  . Allergy   . GERD (gastroesophageal reflux disease)   . Headache(784.0)   . Hyperlipidemia   . Hypertension   . Low back pain   . Osteopenia   . 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 Primiparous   . Rosacea     Family History  Problem Relation Age of Onset  . Atrial fibrillation Mother        had cv in her 29 s  . Arthritis Mother        had staph infection knee replacment  . Cancer Sister        thryoid  . Fibromyalgia Brother   . Colon cancer Neg Hx   . Rectal cancer Neg Hx   . Stomach cancer Neg Hx     Social History   Social History  . Marital status: Married    Spouse name: N/A  . Number of children: N/A  . Years of education: N/A   Social History Main Topics  . Smoking status: Never Smoker  . Smokeless tobacco: Never Used  . Alcohol use No  . Drug use: No  . Sexual activity: Not Asked   Other Topics Concern  . None   Social History Narrative   hh of 1   No pets   No ets.   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 acetaminophen (TYLENOL) 500 MG tablet Take 1,000 mg by mouth every 6 (six) hours as needed.    Marland Kitchen atorvastatin (LIPITOR) 10 MG tablet take 1 tablet by mouth once daily 90 tablet 3  . beta carotene w/minerals (OCUVITE) tablet Take 1 tablet by mouth daily.    . Calcium Carbonate-Vitamin D (CALTRATE 600+D) 600-400 MG-UNIT per tablet Take 1 tablet by mouth daily.    Marland Kitchen co-enzyme Q-10 50 MG capsule Take 150 mg by mouth daily.     . diphenhydrAMINE (BENADRYL) 25 MG tablet Take 25 mg by mouth every 6 (six) hours as needed.    . fluticasone (FLONASE) 50 MCG/ACT nasal spray Place into both nostrils daily.    Marland Kitchen gabapentin (NEURONTIN) 100 MG capsule Take 2 capsules (200 mg total) by mouth at bedtime. 60 capsule 3  . guaiFENesin (MUCINEX) 600 MG 12 hr  tablet Take by mouth 2 (two) times daily.    Marland Kitchen ibuprofen (ADVIL,MOTRIN) 200 MG tablet Take 200 mg by mouth every 6 (six) hours as needed.    . loratadine (CLARITIN) 10 MG tablet Take 10 mg by mouth daily.    Marland Kitchen omeprazole (PRILOSEC) 20 MG capsule Take 20 mg by mouth daily.    . promethazine (PHENERGAN) 12.5 MG suppository Place 1 suppository (12.5 mg total) rectally every 4 (four) hours as needed. 4 suppository 2  . Turmeric 500 MG TABS Take by mouth.    . valsartan-hydrochlorothiazide (DIOVAN-HCT) 160-12.5 MG tablet Take 1 tablet by mouth daily. 90 tablet 3  . fluocinonide-emollient (LIDEX-E) 0.05 % cream Apply 1 application topically 2 (two) times daily. (Patient not taking: Reported on 07/21/2016) 30 g 0  . Docusate Sodium (COLACE PO) Take by mouth.     No facility-administered medications prior to visit.      EXAM:  BP 140/80 (BP Location: Left Arm, Patient Position: Sitting, Cuff Size: Normal)   Pulse 80   Temp 97.8 F (36.6 C) (Oral)   Ht 5\' 4"  (1.626 m)   Wt 156 lb 9.6 oz (71 kg)   BMI 26.88 kg/m   Body mass index is 26.88 kg/m.  GENERAL: vitals reviewed and listed above, alert, oriented, appears well hydrated and in no acute  distress HEENT: atraumatic, conjunctiva  clear, no obvious abnormalities on inspection of external nose and earsnares min congestion NECK: no obvious masses on inspection palpation  LUNGS: clear to auscultation bilaterally, no wheezes, rales or rhonchi, good air movement CV: HRRR, no clubbing cyanosis or  peripheral edema nl cap refill  ABD soft  Suprapubic area  Soft lipoma like mass  About 5 cm? Seems smooth just to left of midline  No adenopathy noted   MS: moves all extremities without noticeable focal  abnormality PSYCH: pleasant and cooperative, no obvious depression or anxiety Lab Results  Component Value Date   WBC 5.1 08/22/2015   HGB 14.6 08/22/2015   HCT 42.7 08/22/2015   PLT 216.0 08/22/2015   GLUCOSE 96 04/22/2016   CHOL 128 04/22/2016   TRIG 89.0 04/22/2016   HDL 47.10 04/22/2016   LDLDIRECT 171.0 06/17/2006   LDLCALC 64 04/22/2016   ALT 14 08/22/2015   AST 11 08/22/2015   NA 138 04/22/2016   K 4.4 04/22/2016   CL 100 04/22/2016   CREATININE 0.80 04/22/2016   BUN 12 04/22/2016   CO2 33 (H) 04/22/2016   TSH 2.97 08/22/2015   HGBA1C 5.3 10/16/2014   BP Readings from Last 3 Encounters:  07/21/16 140/80  06/23/16 138/88  05/13/16 (!) 150/84  reviewed bp los ans noted some low bp 108 range   ASSESSMENT AND PLAN:  Discussed the following assessment and plan:  Lump - ? lipoma  disc options   get surgical opinion   remove or watch - Plan: Ambulatory referral to General Surgery  Essential hypertension - ? trial adjsut med  down and monitor fyu bp  log at her  uyearly visit coming up soon   Medication management  Dizzy spells - ? bp med ? gabapentin and allergy expectant managment and  dec bp med intensity  -Patient advised to return or notify health care team  if symptoms worsen ,persist or new concerns arise.  Patient Instructions  This may be a lipoma   a bad place   Will get a  Surgery consult opinion.  About this. If rapidly growing should be removed.  Consider   Decreasing  Trial  BP meds .  Take 1/2 of the valsartan hctz pill and follow the BP .    For control.  And side effects .  Keep July follow.  Caution with gabapentin            Standley Brooking. Panosh M.D.

## 2016-08-04 ENCOUNTER — Ambulatory Visit (INDEPENDENT_AMBULATORY_CARE_PROVIDER_SITE_OTHER): Payer: Medicare Other | Admitting: Family Medicine

## 2016-08-04 ENCOUNTER — Encounter: Payer: Self-pay | Admitting: Family Medicine

## 2016-08-04 DIAGNOSIS — M7061 Trochanteric bursitis, right hip: Secondary | ICD-10-CM | POA: Diagnosis not present

## 2016-08-04 DIAGNOSIS — M7062 Trochanteric bursitis, left hip: Secondary | ICD-10-CM

## 2016-08-04 NOTE — Progress Notes (Signed)
[name] [address] [place], [place] [address] Phone: [phone] Subjective:    I'm seeing this patient by the request  of:  Panosh, Standley Brooking, MD   CC: Low back pain f/u  PYP:[id number]  Cheryl Robbins is a 71 y.o. female coming in with complaint of low back pain. Patient was found have more of a greater enteric bursitis. Given injections bilaterally. Patient states that she is feeling 80% better. States that she seems to be doing better with carrying her grandchildren. Patient's denies any significant radiation of pain but still has some back pain that seems to be associated with it..      Past Medical History:  Diagnosis Date  . Allergy   . GERD (gastroesophageal reflux disease)   . Headache(784.0)   . Hyperlipidemia   . Hypertension   . Low back pain   . Osteopenia   . 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 Primiparous   . Rosacea    Past Surgical History:  Procedure Laterality Date  . atypical moles    . 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 COLONOSCOPY    . OOPHORECTOMY    . tonsilletomy  1953   Social History   Social History  . Marital status: Married    Spouse name: N/A  . Number of children: N/A  . Years of education: N/A   Social History Main Topics  . Smoking status: Never Smoker  . Smokeless tobacco: Never Used  . Alcohol use No  . Drug use: No  . Sexual activity: Not Asked   Other Topics Concern  . None   Social History Narrative   hh of 1   No pets   No ets.   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 Penicillins     Gums swelled up and turned blue  . Ace Inhibitors Cough    Went away when med changed   . Clindamycin/Lincomycin       Had rash with diflucan and clind but cant tell which one it was  As per dr Ernesto Rutherford  . Sulfonamide Derivatives   . Simvastatin Other (See Comments)    Body leg aches  On simva and doxy better off see July 17 note   Family History  Problem Relation Age of Onset  . Atrial fibrillation Mother        had cv in her 52 s  . Arthritis Mother        had staph infection knee replacment  . Cancer Sister        thryoid  . Fibromyalgia Brother   . Colon cancer Neg Hx   . Rectal cancer Neg Hx   . Stomach cancer Neg Hx     Past medical history, social, surgical and family history all reviewed in electronic medical record.  No pertanent information unless stated regarding to the chief complaint.   Review of Systems: No headache, visual changes, nausea, vomiting, diarrhea, constipation, dizziness, abdominal pain, skin rash, fevers, chills, night sweats, weight loss, swollen lymph nodes, chest pain, shortness of breath, mood changes.  Positive muscle aches and  body aches   Objective  Blood pressure 140/88, pulse 62, weight 156 lb (70.8 kg).   Systems examined below as of 08/04/16 General: NAD A&O x3 mood, affect normal  HEENT: Pupils equal, extraocular movements intact no nystagmus Respiratory: not short of breath at rest or with speaking Cardiovascular: No lower extremity edema, non tender Skin: Warm dry intact with no signs of infection or rash on extremities or on axial skeleton. Abdomen: Soft nontender, no masses Neuro: Cranial nerves  intact, neurovascularly intact in all extremities with 2+ DTRs and 2+ pulses. Lymph: No lymphadenopathy appreciated today  Gait normal with good balance and coordination.  MSK: Non tender with full range of motion and good stability and symmetric strength and tone of shoulders, elbows, wrist,  knee and ankles bilaterally.   Back Exam:  Inspection: Scoliosis noted Motion: Flexion 40 deg, Extension 25 deg, Side Bending to 35 deg bilaterally,  Rotation to  30 deg bilaterally mild worsening range of motion SLR laying: Negative  XSLR laying: Negative  Palpable tenderness: Minimal pain of left greater enteric area otherwise unremarkable. FABER: Minimal tenderness bilaterally Sensory change: Gross sensation intact to all lumbar and sacral dermatomes.  Reflexes: 2+ at both patellar tendons, 2+ at achilles tendons, Babinski's downgoing.  Strength at foot  Plantar-flexion: 5/5 Dorsi-flexion: 5/5 Eversion: 5/5 Inversion: 5/5  Leg strength  Quad: 5/5 Hamstring: 5/5 Hip flexor: 5/5 Hip abductors: 5/5  Gait unremarkable.       Impression and Recommendations:     This case required medical decision making of moderate complexity.      Note: This dictation was prepared with Dragon dictation along with smaller phrase technology. Any transcriptional errors that result from this process are unintentional.

## 2016-08-04 NOTE — Assessment & Plan Note (Signed)
Improvement to the injection. Still has approximately 20% of pain remaining. Discussed with patient the possibility of a lumbar radiculopathy or possibly trying other management such as physical therapy. Patient declined. Patient will continue with conservative therapy and follow-up with me again in 6-8 weeks.

## 2016-08-04 NOTE — Patient Instructions (Signed)
Good to see you  Cheryl Robbins is your friend.  Stay active which I know you will  Exercises at least 2 times a week Can use the gabapentin as needed pennsaid pinkie amount topically 2 times daily as needed.  See me again In 6-8 weeks

## 2016-08-12 ENCOUNTER — Ambulatory Visit: Payer: Medicare Other

## 2016-08-15 ENCOUNTER — Ambulatory Visit: Payer: Medicare Other

## 2016-08-19 ENCOUNTER — Other Ambulatory Visit: Payer: Self-pay | Admitting: General Surgery

## 2016-08-19 DIAGNOSIS — K4090 Unilateral inguinal hernia, without obstruction or gangrene, not specified as recurrent: Secondary | ICD-10-CM

## 2016-08-21 ENCOUNTER — Ambulatory Visit
Admission: RE | Admit: 2016-08-21 | Discharge: 2016-08-21 | Disposition: A | Discharge status: Home / Self Care | Payer: Medicare Other | Source: Ambulatory Visit | Attending: General Surgery | Admitting: General Surgery

## 2016-08-21 DIAGNOSIS — K4090 Unilateral inguinal hernia, without obstruction or gangrene, not specified as recurrent: Secondary | ICD-10-CM

## 2016-08-29 NOTE — Progress Notes (Signed)
Chief Complaint  Patient presents with  . Annual Exam    HPI: Cheryl Robbins 71 y.o. comes in today for Preventive Medicare exam/  visit and Chronic disease management  .Since last visit. BP  Had been in range    80 and 41O diastolic and went back to full dose on July 3      Dizziness no change   To have hernia  Repair early august  LIPIDS  No se of meds. EYE check no MD like mom early cataracts not sig  Gets allergy shots    Health Maintenance  Topic Date Due  . INFLUENZA VACCINE  09/17/2016  . MAMMOGRAM  01/13/2018  . COLONOSCOPY  02/26/2023  . TETANUS/TDAP  08/21/2025  . DEXA SCAN  Completed  . Hepatitis C Screening  Completed  . PNA vac Low Risk Adult  Completed   Health Maintenance Review LIFESTYLE:  Exercise:   Active walking with  Child care  Tobacco/ETS: no Alcohol:  no Sugar beverages: ocass  Sleep: 6-7  Drug use: no HH:1 no pets  GC sitting   About 30   Hours.     Hearing:  Ok   Vision:  No limitations at present . Last eye check UTD  Safety:  Has smoke detector and wears seat belts.  No firearms. No excess sun exposure. Sees dentist regularly.  Falls:  no  Advance directive :  Reviewed  Has one  Memory: Felt to be good  , no concern from her or her family.  Depression: No anhedonia unusual crying or depressive symptoms  Nutrition: Eats well balanced diet; adequate calcium and vitamin D. No swallowing chewing problems.  Injury: no major injuries in the last six months.  Other healthcare providers:  Reviewed today .  Social:  Widowed . No pets.  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 She is independent.   ROS:  GEN/ HEENT: No fever, significant weight changes sweats headaches vision problems hearing changes, CV/ PULM; No chest pain shortness of breath cough,  syncope,edema  change in exercise tolerance. GI /GU: No adominal pain, vomiting, change in bowel habits. No blood in the stool. No significant GU symptoms. SKIN/HEME: ,no acute skin rashes suspicious lesions or bleeding. No lymphadenopathy, nodules, masses.  NEURO/ PSYCH:  No neurologic signs such as weakness numbness. No depression anxiety. IMM/ Allergy: No unusual infections.  Allergy .   REST of 12 system review negative except as per HPI   Past Medical History:  Diagnosis Date  . Allergy   . GERD (gastroesophageal reflux disease)   . Headache(784.0)   . Hyperlipidemia   . Hypertension   . Low back pain   . Osteopenia   . 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 Primiparous   . Rosacea     Family History  Problem Relation Age of Onset  . Atrial fibrillation Mother        had cv in her 54 s  . Arthritis Mother        had staph infection knee replacment  . Cancer Sister        thryoid  . Fibromyalgia Brother   . Colon cancer Neg Hx   . Rectal cancer Neg Hx   . Stomach cancer Neg Hx  Social History   Social History  . Marital status: Married    Spouse name: N/A  . Number of children: N/A  . Years of education: N/A   Social History Main Topics  . Smoking status: Never Smoker  . Smokeless tobacco: Never Used  . Alcohol use No  . Drug use: No  . Sexual activity: Not Asked   Other Topics Concern  . None   Social History Narrative   hh of 1   No pets   No ets.   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 acetaminophen (TYLENOL) 500 MG tablet Take 1,000 mg by mouth every 6 (six) hours as needed.  Marland Kitchen atorvastatin (LIPITOR) 10 MG tablet take 1 tablet by mouth once daily  . beta carotene w/minerals (OCUVITE)  tablet Take 1 tablet by mouth daily.  . Calcium Carbonate-Vitamin D (CALTRATE 600+D) 600-400 MG-UNIT per tablet Take 1 tablet by mouth daily.  Marland Kitchen co-enzyme Q-10 50 MG capsule Take 150 mg by mouth daily.   . diphenhydrAMINE (BENADRYL) 25 MG tablet Take 25 mg by mouth every 6 (six) hours as needed.  . fluocinonide-emollient (LIDEX-E) 0.05 % cream Apply 1 application topically 2 (two) times daily.  . fluticasone (FLONASE) 50 MCG/ACT nasal spray Place into both nostrils daily.  Marland Kitchen guaiFENesin (MUCINEX) 600 MG 12 hr tablet Take by mouth 2 (two) times daily.  Marland Kitchen ibuprofen (ADVIL,MOTRIN) 200 MG tablet Take 200 mg by mouth every 6 (six) hours as needed.  . loratadine (CLARITIN) 10 MG tablet Take 10 mg by mouth daily.  Marland Kitchen omeprazole (PRILOSEC) 20 MG capsule Take 20 mg by mouth daily.  . promethazine (PHENERGAN) 12.5 MG suppository Place 1 suppository (12.5 mg total) rectally every 4 (four) hours as needed.  . Turmeric 500 MG TABS Take by mouth.  . valsartan-hydrochlorothiazide (DIOVAN-HCT) 160-12.5 MG tablet Take 1 tablet by mouth daily.  . [DISCONTINUED] gabapentin (NEURONTIN) 100 MG capsule Take 2 capsules (200 mg total) by mouth at bedtime.   No facility-administered encounter medications on file as of 09/01/2016.     EXAM:  BP 130/90 (BP Location: Left Arm, Patient Position: Sitting, Cuff Size: Normal)   Pulse 79   Temp 97.8 F (36.6 C) (Oral)   Ht 5' 5"  (1.651 m)   Wt 153 lb 3.2 oz (69.5 kg)   BMI 25.49 kg/m   Body mass index is 25.49 kg/m.  Physical Exam: Vital signs reviewed FFM:BWGY is a well-developed well-nourished alert cooperative   who appears stated age in no acute distress.  HEENT: normocephalic atraumatic , Eyes: PERRL EOM's full, conjunctiva clear,glassess  Nares: paten,t no deformity discharge or tenderness., Ears: no deformity EAC's clear TMs with normal landmarks. Mouth: clear OP, no lesions, edema.  Moist mucous membranes. Dentition in adequate repair. NECK: supple without  masses, thyromegaly or bruits. CHEST/PULM:  Clear to auscultation and percussion breath sounds equal no wheeze , rales or rhonchi. No chest wall deformities or tenderness.Breast: normal by inspection . No dimpling, discharge, masses, tenderness or discharge . CV: PMI is nondisplaced, S1 S2 no gallops, murmurs, rubs. Peripheral pulses are full without delay.No JVD .  ABDOMEN: Bowel sounds normal nontender  No guard or rebound, no hepato splenomegal no CVA tenderness.  Extremtities:  No clubbing cyanosis or edema, no acute joint swelling or redness no focal atrophy NEURO:  Oriented x3, cranial nerves 3-12 appear to be intact, no obvious focal weakness,SKIN: No acute rashes normal turgor, color, no bruising or petechiae. PSYCH: Oriented, good eye contact, no obvious depression anxiety, cognition and judgment appear normal. LN: no cervical axillary inguinal adenopathy No noted deficits in memory, attention, and speech.   ASSESSMENT AND PLAN:  Discussed the following assessment and plan:  Visit for preventive health examination - Plan: Basic metabolic panel, Hepatic function panel, CBC with Differential/Platelet, TSH  Medication management - Plan: Basic metabolic panel, Hepatic function panel, CBC with Differential/Platelet, TSH  Essential hypertension - Plan: Basic metabolic panel, Hepatic function panel, CBC with Differential/Platelet, TSH  POLIOMYELITIS, HX OF  Hyperlipidemia, unspecified hyperlipidemia type - Plan: Basic metabolic panel, Hepatic function panel, CBC with Differential/Platelet, TSH Agree with getting elective hernia repair when well  dsic shingles vaccine lab monitoring as appropraite .  And bp control goals   Patient Care Team: Panosh, Standley Brooking, MD as PCP - General Ernesto Rutherford, Maudry Mayhew, MD (Otolaryngology) Allyn Kenner, MD (Dermatology) Latanya Maudlin, MD (Orthopedic Surgery) ed Eddie Dibbles (Ophthalmology) Tiajuana Amass, MD as Consulting Physician (Allergy and  Immunology)    Lab Results  Component Value Date   WBC 5.1 08/22/2015   HGB 14.6 08/22/2015   HCT 42.7 08/22/2015   PLT 216.0 08/22/2015   GLUCOSE 96 04/22/2016   CHOL 128 04/22/2016   TRIG 89.0 04/22/2016   HDL 47.10 04/22/2016   LDLDIRECT 171.0 06/17/2006   LDLCALC 64 04/22/2016   ALT 14 08/22/2015   AST 11 08/22/2015   NA 138 04/22/2016   K 4.4 04/22/2016   CL 100 04/22/2016   CREATININE 0.80 04/22/2016   BUN 12 04/22/2016   CO2 33 (H) 04/22/2016   TSH 2.97 08/22/2015   HGBA1C 5.3 10/16/2014    Patient Instructions    Shingles vaccine    Shingrix  .  Will notify you  of labs when available. If all ok then  rov in 6 months  bp and med check        Preventive Care 65 Years and Older, Female Preventive care refers to lifestyle choices and visits with your health care provider that can promote health and wellness. What does preventive care include?  A yearly physical exam. This is also called an annual well check.  Dental exams once or twice a year.  Routine eye exams. Ask your health care provider how often you should have your eyes checked.  Personal lifestyle choices, including: ? Daily care of your teeth and gums. ? Regular physical activity. ? Eating a healthy diet. ? Avoiding tobacco and drug use. ? Limiting alcohol use. ? Practicing safe sex. ? Taking low-dose aspirin every day. ? Taking vitamin and mineral supplements as recommended by your health care provider. What happens during an annual well check? The services and screenings done by your health care provider during your annual well check will depend on your age, overall health, lifestyle risk factors, and family history of disease. Counseling Your health care provider may ask you questions about your:  Alcohol use.  Tobacco use.  Drug use.  Emotional well-being.  Home and relationship well-being.  Sexual activity.  Eating habits.  History of falls.  Memory and ability to  understand (cognition).  Work and work Statistician.  Reproductive health.  Screening You may have the following tests or measurements:  Height, weight, and BMI.  Blood  pressure.  Lipid and cholesterol levels. These may be checked every 5 years, or more frequently if you are over 66 years old.  Skin check.  Lung cancer screening. You may have this screening every year starting at age 41 if you have a 30-pack-year history of smoking and currently smoke or have quit within the past 15 years.  Fecal occult blood test (FOBT) of the stool. You may have this test every year starting at age 42.  Flexible sigmoidoscopy or colonoscopy. You may have a sigmoidoscopy every 5 years or a colonoscopy every 10 years starting at age 59.  Hepatitis C blood test.  Hepatitis B blood test.  Sexually transmitted disease (STD) testing.  Diabetes screening. This is done by checking your blood sugar (glucose) after you have not eaten for a while (fasting). You may have this done every 1-3 years.  Bone density scan. This is done to screen for osteoporosis. You may have this done starting at age 13.  Mammogram. This may be done every 1-2 years. Talk to your health care provider about how often you should have regular mammograms.  Talk with your health care provider about your test results, treatment options, and if necessary, the need for more tests. Vaccines Your health care provider may recommend certain vaccines, such as:  Influenza vaccine. This is recommended every year.  Tetanus, diphtheria, and acellular pertussis (Tdap, Td) vaccine. You may need a Td booster every 10 years.  Varicella vaccine. You may need this if you have not been vaccinated.  Zoster vaccine. You may need this after age 44.  Measles, mumps, and rubella (MMR) vaccine. You may need at least one dose of MMR if you were born in 1957 or later. You may also need a second dose.  Pneumococcal 13-valent conjugate (PCV13)  vaccine. One dose is recommended after age 19.  Pneumococcal polysaccharide (PPSV23) vaccine. One dose is recommended after age 58.  Meningococcal vaccine. You may need this if you have certain conditions.  Hepatitis A vaccine. You may need this if you have certain conditions or if you travel or work in places where you may be exposed to hepatitis A.  Hepatitis B vaccine. You may need this if you have certain conditions or if you travel or work in places where you may be exposed to hepatitis B.  Haemophilus influenzae type b (Hib) vaccine. You may need this if you have certain conditions.  Talk to your health care provider about which screenings and vaccines you need and how often you need them. This information is not intended to replace advice given to you by your health care provider. Make sure you discuss any questions you have with your health care provider. Document Released: 03/02/2015 Document Revised: 10/24/2015 Document Reviewed: 12/05/2014 Elsevier Interactive Patient Education  2017 [place] K. Panosh M.D.

## 2016-09-01 ENCOUNTER — Encounter: Payer: Self-pay | Admitting: Internal Medicine

## 2016-09-01 ENCOUNTER — Ambulatory Visit (INDEPENDENT_AMBULATORY_CARE_PROVIDER_SITE_OTHER): Payer: Medicare Other | Admitting: Internal Medicine

## 2016-09-01 VITALS — BP 130/90 | HR 79 | Temp 97.8°F | Ht 65.0 in | Wt 153.2 lb

## 2016-09-01 DIAGNOSIS — E785 Hyperlipidemia, unspecified: Secondary | ICD-10-CM | POA: Diagnosis not present

## 2016-09-01 DIAGNOSIS — Z0000 Encounter for general adult medical examination without abnormal findings: Secondary | ICD-10-CM | POA: Diagnosis not present

## 2016-09-01 DIAGNOSIS — I10 Essential (primary) hypertension: Secondary | ICD-10-CM | POA: Diagnosis not present

## 2016-09-01 DIAGNOSIS — Z8612 Personal history of poliomyelitis: Secondary | ICD-10-CM | POA: Diagnosis not present

## 2016-09-01 DIAGNOSIS — Z79899 Other long term (current) drug therapy: Secondary | ICD-10-CM | POA: Diagnosis not present

## 2016-09-01 LAB — CBC WITH DIFFERENTIAL/PLATELET
BASOS ABS: 0 10*3/uL (ref 0.0–0.1)
Basophils Relative: 0.3 % (ref 0.0–3.0)
EOS ABS: 0.1 10*3/uL (ref 0.0–0.7)
Eosinophils Relative: 2.3 % (ref 0.0–5.0)
HCT: 44.7 % (ref 36.0–46.0)
Hemoglobin: 15.3 g/dL — ABNORMAL HIGH (ref 12.0–15.0)
LYMPHS ABS: 1.3 10*3/uL (ref 0.7–4.0)
Lymphocytes Relative: 29.8 % (ref 12.0–46.0)
MCHC: 34.2 g/dL (ref 30.0–36.0)
MCV: 86.9 fl (ref 78.0–100.0)
MONO ABS: 0.3 10*3/uL (ref 0.1–1.0)
MONOS PCT: 7.1 % (ref 3.0–12.0)
NEUTROS ABS: 2.6 10*3/uL (ref 1.4–7.7)
NEUTROS PCT: 60.5 % (ref 43.0–77.0)
Platelets: 193 10*3/uL (ref 150.0–400.0)
RBC: 5.14 Mil/uL — AB (ref 3.87–5.11)
RDW: 12.8 % (ref 11.5–15.5)
WBC: 4.4 10*3/uL (ref 4.0–10.5)

## 2016-09-01 LAB — BASIC METABOLIC PANEL
BUN: 10 mg/dL (ref 6–23)
CHLORIDE: 99 meq/L (ref 96–112)
CO2: 30 meq/L (ref 19–32)
Calcium: 9.6 mg/dL (ref 8.4–10.5)
Creatinine, Ser: 0.75 mg/dL (ref 0.40–1.20)
GFR: 80.75 mL/min (ref >60.00)
GLUCOSE: 104 mg/dL — AB (ref 70–99)
Potassium: 3.8 mEq/L (ref 3.5–5.1)
SODIUM: 136 meq/L (ref 135–145)

## 2016-09-01 LAB — HEPATIC FUNCTION PANEL
ALBUMIN: 4.4 g/dL (ref 3.5–5.2)
ALK PHOS: 38 U/L — AB (ref 39–117)
ALT: 15 U/L (ref 0–35)
AST: 13 U/L (ref 0–37)
BILIRUBIN DIRECT: 0.1 mg/dL (ref 0.0–0.3)
TOTAL PROTEIN: 6.4 g/dL (ref 6.0–8.3)
Total Bilirubin: 0.6 mg/dL (ref 0.2–1.2)

## 2016-09-01 LAB — TSH: TSH: 6.27 u[IU]/mL — AB (ref 0.35–4.50)

## 2016-09-01 NOTE — Patient Instructions (Addendum)
Shingles vaccine    Shingrix  .  Will notify you  of labs when available. If all ok then  rov in 6 months  bp and med check        Preventive Care 72 Years and Older, Female Preventive care refers to lifestyle choices and visits with your health care provider that can promote health and wellness. What does preventive care include?  A yearly physical exam. This is also called an annual well check.  Dental exams once or twice a year.  Routine eye exams. Ask your health care provider how often you should have your eyes checked.  Personal lifestyle choices, including: ? Daily care of your teeth and gums. ? Regular physical activity. ? Eating a healthy diet. ? Avoiding tobacco and drug use. ? Limiting alcohol use. ? Practicing safe sex. ? Taking low-dose aspirin every day. ? Taking vitamin and mineral supplements as recommended by your health care provider. What happens during an annual well check? The services and screenings done by your health care provider during your annual well check will depend on your age, overall health, lifestyle risk factors, and family history of disease. Counseling Your health care provider may ask you questions about your:  Alcohol use.  Tobacco use.  Drug use.  Emotional well-being.  Home and relationship well-being.  Sexual activity.  Eating habits.  History of falls.  Memory and ability to understand (cognition).  Work and work Statistician.  Reproductive health.  Screening You may have the following tests or measurements:  Height, weight, and BMI.  Blood pressure.  Lipid and cholesterol levels. These may be checked every 5 years, or more frequently if you are over 53 years old.  Skin check.  Lung cancer screening. You may have this screening every year starting at age 72 if you have a 30-pack-year history of smoking and currently smoke or have quit within the past 15 years.  Fecal occult blood test (FOBT) of the  stool. You may have this test every year starting at age 72.  Flexible sigmoidoscopy or colonoscopy. You may have a sigmoidoscopy every 5 years or a colonoscopy every 10 years starting at age 36.  Hepatitis C blood test.  Hepatitis B blood test.  Sexually transmitted disease (STD) testing.  Diabetes screening. This is done by checking your blood sugar (glucose) after you have not eaten for a while (fasting). You may have this done every 1-3 years.  Bone density scan. This is done to screen for osteoporosis. You may have this done starting at age 1.  Mammogram. This may be done every 1-2 years. Talk to your health care provider about how often you should have regular mammograms.  Talk with your health care provider about your test results, treatment options, and if necessary, the need for more tests. Vaccines Your health care provider may recommend certain vaccines, such as:  Influenza vaccine. This is recommended every year.  Tetanus, diphtheria, and acellular pertussis (Tdap, Td) vaccine. You may need a Td booster every 10 years.  Varicella vaccine. You may need this if you have not been vaccinated.  Zoster vaccine. You may need this after age 72.  Measles, mumps, and rubella (MMR) vaccine. You may need at least one dose of MMR if you were born in 1957 or later. You may also need a second dose.  Pneumococcal 13-valent conjugate (PCV13) vaccine. One dose is recommended after age 72.  Pneumococcal polysaccharide (PPSV23) vaccine. One dose is recommended after age 72.  Meningococcal vaccine. You may need this if you have certain conditions.  Hepatitis A vaccine. You may need this if you have certain conditions or if you travel or work in places where you may be exposed to hepatitis A.  Hepatitis B vaccine. You may need this if you have certain conditions or if you travel or work in places where you may be exposed to hepatitis B.  Haemophilus influenzae type b (Hib) vaccine. You  may need this if you have certain conditions.  Talk to your health care provider about which screenings and vaccines you need and how often you need them. This information is not intended to replace advice given to you by your health care provider. Make sure you discuss any questions you have with your health care provider. Document Released: 03/02/2015 Document Revised: 10/24/2015 Document Reviewed: 12/05/2014 Elsevier Interactive Patient Education  2017 [employer].

## 2016-09-03 ENCOUNTER — Other Ambulatory Visit: Payer: Self-pay | Admitting: Emergency Medicine

## 2016-09-03 DIAGNOSIS — R7989 Other specified abnormal findings of blood chemistry: Secondary | ICD-10-CM

## 2016-09-08 NOTE — Telephone Encounter (Signed)
Please change to  micardis 40/12.5  Disp 90 and see how this works( stop the valsartan)

## 2016-09-09 ENCOUNTER — Other Ambulatory Visit: Payer: Self-pay | Admitting: Emergency Medicine

## 2016-09-09 MED ORDER — TELMISARTAN-HCTZ 40-12.5 MG PO TABS: 1.0000 | ORAL_TABLET | Freq: Every day | ORAL | 0 refills | Status: DC

## 2016-09-23 ENCOUNTER — Ambulatory Visit: Payer: Medicare Other

## 2016-09-23 ENCOUNTER — Other Ambulatory Visit: Payer: Self-pay | Admitting: General Surgery

## 2016-09-29 ENCOUNTER — Ambulatory Visit: Payer: Medicare Other | Admitting: Family Medicine

## 2016-09-29 NOTE — Progress Notes (Signed)
[facility] [address], [place] [address] Phone: [phone] Subjective:    I'm seeing this patient by the request  of:    CC: Bilateral hip pain  HAL:[id number]  Cheryl Robbins is a 71 y.o. female coming in with complaint of bilateral greater trochanteric bursitis previously. Has done well with conservative therapy. Has been doing home exercises, icing regimen, as well as all the other modalities. Patient was given injections back in May. Patient states that she is 97% better. Feels that the topical anti-inflammatories is the most helpful      Past Medical History:  Diagnosis Date  . Allergy   . GERD (gastroesophageal reflux disease)   . Headache(784.0)   . Hyperlipidemia   . Hypertension   . Low back pain   . Osteopenia   . 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 Primiparous   . Rosacea    Past Surgical History:  Procedure Laterality Date  . atypical moles    . 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 COLONOSCOPY    . OOPHORECTOMY    . tonsilletomy  1953   Social History   Social History  . Marital status: Married    Spouse name: N/A  . Number of children: N/A  . Years of education: N/A   Social History Main Topics  . Smoking status: Never Smoker  . Smokeless tobacco: Never Used  . Alcohol use No  . Drug use: No  . Sexual activity: Not Asked   Other Topics Concern  . None   Social History Narrative   hh of 1   No pets   No ets.   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 Penicillins     Gums swelled up and turned blue  . Ace Inhibitors Cough    Went away when med changed   . Clindamycin/Lincomycin     Had rash with diflucan and clind but  cant tell which one it was  As per dr Ernesto Rutherford  . Sulfonamide Derivatives   . Simvastatin Other (See Comments)    Body leg aches  On simva and doxy better off see July 17 note   Family History  Problem Relation Age of Onset  . Atrial fibrillation Mother        had cv in her 91 s  . Arthritis Mother        had staph infection knee replacment  . Cancer Sister        thryoid  . Fibromyalgia Brother   . Colon cancer Neg Hx   . Rectal cancer Neg Hx   . Stomach cancer Neg Hx      Past medical history, social, surgical and family history all reviewed in electronic medical record.  No pertanent information unless stated regarding to the chief complaint.   Review of Systems: No headache, visual changes, nausea, vomiting, diarrhea, constipation, dizziness, abdominal pain, skin rash, fevers, chills, night sweats, weight loss, swollen lymph nodes, body aches, joint swelling, muscle aches, chest pain, shortness of breath, mood changes.    Objective  Blood pressure 140/88, pulse 76, weight 154  lb (69.9 kg). Systems examined below as of 09/30/16   General: No apparent distress alert and oriented x3 mood and affect normal, dressed appropriately.  HEENT: Pupils equal, extraocular movements intact  Respiratory: Patient's speak in full sentences and does not appear short of breath  Cardiovascular: No lower extremity edema, non tender, no erythema  Skin: Warm dry intact with no signs of infection or rash on extremities or on axial skeleton.  Abdomen: Soft nontender  Neuro: Cranial nerves II through XII are intact, neurovascularly intact in all extremities with 2+ DTRs and 2+ pulses.  Lymph: No lymphadenopathy of posterior or anterior cervical chain or axillae bilaterally.  Gait mild antalgic.  MSK:  Non tender with full range of motion and good stability and symmetric strength and tone of shoulders, elbows, wrist, hip, knee and ankles bilaterally. Mild arthritic changes.     Impression and  Recommendations:     This case required medical decision making of moderate complexity.      Note: This dictation was prepared with Dragon dictation along with smaller phrase technology. Any transcriptional errors that result from this process are unintentional.

## 2016-09-30 ENCOUNTER — Ambulatory Visit (INDEPENDENT_AMBULATORY_CARE_PROVIDER_SITE_OTHER): Payer: Medicare Other | Admitting: Family Medicine

## 2016-09-30 ENCOUNTER — Encounter: Payer: Self-pay | Admitting: Family Medicine

## 2016-09-30 DIAGNOSIS — M7061 Trochanteric bursitis, right hip: Secondary | ICD-10-CM

## 2016-09-30 DIAGNOSIS — M7062 Trochanteric bursitis, left hip: Secondary | ICD-10-CM

## 2016-09-30 NOTE — Assessment & Plan Note (Signed)
Patient is too much better with conservative therapy. Follow-up again as needed

## 2016-10-28 ENCOUNTER — Ambulatory Visit: Payer: Medicare Other

## 2016-11-07 ENCOUNTER — Encounter: Payer: Self-pay | Admitting: Internal Medicine

## 2016-11-10 ENCOUNTER — Other Ambulatory Visit: Payer: Medicare Other

## 2016-11-10 ENCOUNTER — Other Ambulatory Visit (INDEPENDENT_AMBULATORY_CARE_PROVIDER_SITE_OTHER): Payer: Medicare Other

## 2016-11-10 DIAGNOSIS — R946 Abnormal results of thyroid function studies: Secondary | ICD-10-CM | POA: Diagnosis not present

## 2016-11-10 DIAGNOSIS — R7989 Other specified abnormal findings of blood chemistry: Secondary | ICD-10-CM

## 2016-11-10 LAB — T4, FREE: Free T4: 0.99 ng/dL (ref 0.60–1.60)

## 2016-11-10 LAB — TSH: TSH: 5.24 u[IU]/mL — ABNORMAL HIGH (ref 0.35–4.50)

## 2016-11-22 ENCOUNTER — Encounter: Payer: Self-pay | Admitting: Internal Medicine

## 2016-12-20 ENCOUNTER — Other Ambulatory Visit: Payer: Self-pay | Admitting: Internal Medicine

## 2016-12-24 ENCOUNTER — Telehealth: Payer: Self-pay | Admitting: Internal Medicine

## 2016-12-24 MED ORDER — TELMISARTAN-HCTZ 40-12.5 MG PO TABS: 1.0000 | ORAL_TABLET | Freq: Every day | ORAL | 0 refills | Status: DC

## 2016-12-24 NOTE — Telephone Encounter (Signed)
I called and spoke with pharmacy. They were wondering about her Micardis Rx because they had not received a new Rx for patient and she is out. Rx sent to [facility]. Nothing further needed.

## 2016-12-24 NOTE — Telephone Encounter (Signed)
Pharm has a question on micardis

## 2016-12-26 ENCOUNTER — Encounter: Payer: Self-pay | Admitting: Family Medicine

## 2016-12-29 ENCOUNTER — Encounter: Payer: Self-pay | Admitting: Family Medicine

## 2016-12-29 ENCOUNTER — Other Ambulatory Visit: Payer: Self-pay | Admitting: Internal Medicine

## 2016-12-30 ENCOUNTER — Encounter: Payer: Self-pay | Admitting: Internal Medicine

## 2017-02-11 ENCOUNTER — Encounter: Payer: Self-pay | Admitting: Family Medicine

## 2017-02-11 ENCOUNTER — Ambulatory Visit: Payer: Medicare Other | Admitting: Family Medicine

## 2017-02-11 VITALS — BP 142/90 | HR 78 | Temp 98.7°F | Wt 157.0 lb

## 2017-02-11 DIAGNOSIS — J018 Other acute sinusitis: Secondary | ICD-10-CM | POA: Diagnosis not present

## 2017-02-11 MED ORDER — FLUCONAZOLE 150 MG PO TABS: 150.0000 mg | ORAL_TABLET | Freq: Once | ORAL | 2 refills | Status: AC

## 2017-02-11 MED ORDER — LEVOFLOXACIN 500 MG PO TABS: 500.0000 mg | ORAL_TABLET | Freq: Every day | ORAL | 0 refills | Status: AC

## 2017-02-11 NOTE — Progress Notes (Signed)
   Subjective:    Patient ID: Cheryl Robbins, female    DOB: [birth year], 71 y.o.   MRN: [id number]  HPI Here for 6 weeks of intermittent sinus pressure, PND, ST and dry cough. No fever.    Review of Systems  Constitutional: Negative.   HENT: Positive for congestion, postnasal drip, sinus pressure, sinus pain and sore throat.   Eyes: Negative.   Respiratory: Positive for cough.        Objective:   Physical Exam  Constitutional: She appears well-developed and well-nourished.  HENT:  Right Ear: External ear normal.  Left Ear: External ear normal.  Nose: Nose normal.  Mouth/Throat: Oropharynx is clear and moist.  Eyes: Conjunctivae are normal.  Neck: No thyromegaly present.  Pulmonary/Chest: Effort normal and breath sounds normal. No respiratory distress. She has no wheezes. She has no rales.  Lymphadenopathy:    She has no cervical adenopathy.          Assessment & Plan:  Sinusitis, treat with Levaquin and Mucinex.  Alysia Penna, MD

## 2017-02-19 ENCOUNTER — Ambulatory Visit: Payer: Medicare Other | Admitting: Internal Medicine

## 2017-02-27 NOTE — Progress Notes (Signed)
Chief Complaint  Patient presents with  . Follow-up    BP check and med check    HPI: Cheryl Robbins 72 y.o. come in for Chronic disease management  6 mos    Has had hand foot and mouth and then viral infection .   bp has been ok .     Hx ofvirus attack thyroid.   Dr Kenton Kingfisher.  On med for a while and then got better   caretakes 3 days per week in ral  Children toddlers to teen s  L;fu thyroid test   Nl and then abnormal last check   Lipids  On meds  No se noted ? ROS: See pertinent positives and negatives per HPI.  Past Medical History:  Diagnosis Date  . Allergy   . GERD (gastroesophageal reflux disease)   . Headache(784.0)   . Hyperlipidemia   . Hypertension   . Low back pain   . Osteopenia   . 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 Primiparous   . Rosacea     Family History  Problem Relation Age of Onset  . Atrial fibrillation Mother        had cv in her 28 s  . Arthritis Mother        had staph infection knee replacment  . Cancer Sister        thryoid  . Fibromyalgia Brother   . Colon cancer Neg Hx   . Rectal cancer Neg Hx   . Stomach cancer Neg Hx     Social History   Socioeconomic History  . Marital status: Married    Spouse name: None  . Number of children: None  . Years of education: None  . Highest education level: None  Social Needs  . Financial resource strain: None  . Food insecurity - worry: None  . Food insecurity - inability: None  . Transportation needs - medical: None  . Transportation needs - non-medical: None  Occupational History  . None  Tobacco Use  . Smoking status: Never Smoker  . Smokeless tobacco: Never Used  Substance and Sexual Activity  . Alcohol use: No  . Drug use: No  . Sexual activity: None  Other Topics Concern  . None  Social History Narrative   hh of 1   No pets   No ets.   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 acetaminophen (TYLENOL) 500 MG tablet Take 1,000 mg by mouth every 6 (six) hours as needed.    Marland Kitchen atorvastatin (LIPITOR) 10 MG tablet TAKE 1 TABLET BY MOUTH EVERY DAY 90 tablet 0  . beta carotene w/minerals (OCUVITE) tablet Take 1 tablet by mouth daily.    . Calcium Carbonate-Vitamin D (CALTRATE 600+D) 600-400 MG-UNIT per tablet Take 1 tablet by mouth daily.    Marland Kitchen co-enzyme Q-10 50 MG capsule Take 150 mg by mouth daily.     . diphenhydrAMINE (BENADRYL) 25 MG tablet Take 25 mg by mouth every 6 (six) hours as needed.    . fluocinonide-emollient (LIDEX-E) 0.05 % cream Apply 1 application topically 2 (two) times daily. 30 g 0  . fluticasone (FLONASE) 50 MCG/ACT  nasal spray Place into both nostrils daily.    Marland Kitchen guaiFENesin (MUCINEX) 600 MG 12 hr tablet Take by mouth 2 (two) times daily.    Marland Kitchen ibuprofen (ADVIL,MOTRIN) 200 MG tablet Take 200 mg by mouth every 6 (six) hours as needed.    . loratadine (CLARITIN) 10 MG tablet Take 10 mg by mouth daily.    Marland Kitchen omeprazole (PRILOSEC) 20 MG capsule Take 20 mg by mouth daily.    . promethazine (PHENERGAN) 12.5 MG suppository Place 1 suppository (12.5 mg total) rectally every 4 (four) hours as needed. 4 suppository 2  . telmisartan-hydrochlorothiazide (MICARDIS HCT) 40-12.5 MG tablet Take 1 tablet daily by mouth. 90 tablet 0  . Turmeric 500 MG TABS Take by mouth.    . valsartan-hydrochlorothiazide (DIOVAN-HCT) 160-12.5 MG tablet Take 1 tablet by mouth daily. 90 tablet 3   No facility-administered medications prior to visit.      EXAM:  BP (!) 142/80 (BP Location: Left Arm, Patient Position: Sitting, Cuff Size: Normal)   Pulse 71   Temp 97.9 F (36.6 C) (Oral)   Wt 158 lb (71.7 kg)   BMI 26.29 kg/m   Body mass index is 26.29 kg/m.  GENERAL: vitals reviewed and listed  above, alert, oriented, appears well hydrated and in no acute distress HEENT: atraumatic, conjunctiva  clear, no obvious abnormalities on inspection of external nose and ears OP : no lesion edema or exudate  NECK: no obvious masses on inspection palpation  CV: HRRR, no clubbing cyanosis or  peripheral edema nl cap refill  Abdomen:  Sof,t normal bowel sounds without hepatosplenomegaly, no guarding rebound or masses no CVA tenderness MS: moves all extremities without noticeable focal  abnormality PSYCH: pleasant and cooperative, no obvious depression or anxiety Lab Results  Component Value Date   WBC 4.4 09/01/2016   HGB 15.3 (H) 09/01/2016   HCT 44.7 09/01/2016   PLT 193.0 09/01/2016   GLUCOSE 104 (H) 09/01/2016   CHOL 128 04/22/2016   TRIG 89.0 04/22/2016   HDL 47.10 04/22/2016   LDLDIRECT 171.0 06/17/2006   LDLCALC 64 04/22/2016   ALT 15 09/01/2016   AST 13 09/01/2016   NA 136 09/01/2016   K 3.8 09/01/2016   CL 99 09/01/2016   CREATININE 0.75 09/01/2016   BUN 10 09/01/2016   CO2 30 09/01/2016   TSH 5.24 (H) 11/10/2016   HGBA1C 5.3 10/16/2014   BP Readings from Last 3 Encounters:  03/02/17 (!) 142/80  02/11/17 (!) 142/90  09/30/16 140/88    ASSESSMENT AND PLAN:  Discussed the following assessment and plan:  Essential hypertension - mostly at goal  142 today   stay on same meds  - Plan: Lipid panel, TSH, T4, free, CANCELED: Thyroid antibodies  Elevated TSH - hx of viral thyroid issue years ago sis had nodule  over 20 y ago  - Plan: Lipid panel, TSH, T4, free, Thyroid antibodies, CANCELED: Thyroid antibodies  Medication management - Plan: Lipid panel, TSH, T4, free, CANCELED: Thyroid antibodies  Hyperlipidemia, unspecified hyperlipidemia type - due for labs moitoring soon get fasting labs  - Plan: Lipid panel, TSH, T4, free, CANCELED: Thyroid antibodies  POLIOMYELITIS, HX OF  Subclinical hypothyroidism Disc  subclincal hypothyroid  Possibility will follow   6 mos  yearly    -Patient advised to return or notify health care team  if  new concerns arise.  Patient Instructions  Will notify you  of labs when available. Gt fasting lab appt    No change in meds  at this time .   cpx  In about 6 months if all ok .       Standley Brooking. Hatley Henegar M.D.

## 2017-03-02 ENCOUNTER — Ambulatory Visit: Payer: Medicare Other | Admitting: Internal Medicine

## 2017-03-02 ENCOUNTER — Encounter: Payer: Self-pay | Admitting: Internal Medicine

## 2017-03-02 VITALS — BP 142/80 | HR 71 | Temp 97.9°F | Wt 158.0 lb

## 2017-03-02 DIAGNOSIS — I10 Essential (primary) hypertension: Secondary | ICD-10-CM | POA: Diagnosis not present

## 2017-03-02 DIAGNOSIS — Z79899 Other long term (current) drug therapy: Secondary | ICD-10-CM | POA: Diagnosis not present

## 2017-03-02 DIAGNOSIS — E039 Hypothyroidism, unspecified: Secondary | ICD-10-CM | POA: Diagnosis not present

## 2017-03-02 DIAGNOSIS — E038 Other specified hypothyroidism: Secondary | ICD-10-CM

## 2017-03-02 DIAGNOSIS — Z8612 Personal history of poliomyelitis: Secondary | ICD-10-CM | POA: Diagnosis not present

## 2017-03-02 DIAGNOSIS — R7989 Other specified abnormal findings of blood chemistry: Secondary | ICD-10-CM

## 2017-03-02 DIAGNOSIS — E785 Hyperlipidemia, unspecified: Secondary | ICD-10-CM

## 2017-03-02 NOTE — Patient Instructions (Addendum)
Will notify you  of labs when available. Gt fasting lab appt    No change in meds at this time .   cpx  In about 6 months if all ok .

## 2017-03-03 ENCOUNTER — Encounter: Payer: Self-pay | Admitting: Internal Medicine

## 2017-03-09 ENCOUNTER — Other Ambulatory Visit (INDEPENDENT_AMBULATORY_CARE_PROVIDER_SITE_OTHER): Payer: Medicare Other

## 2017-03-09 ENCOUNTER — Other Ambulatory Visit: Payer: Self-pay | Admitting: Internal Medicine

## 2017-03-09 DIAGNOSIS — I10 Essential (primary) hypertension: Secondary | ICD-10-CM

## 2017-03-09 DIAGNOSIS — Z79899 Other long term (current) drug therapy: Secondary | ICD-10-CM

## 2017-03-09 DIAGNOSIS — R7989 Other specified abnormal findings of blood chemistry: Secondary | ICD-10-CM

## 2017-03-09 DIAGNOSIS — E785 Hyperlipidemia, unspecified: Secondary | ICD-10-CM | POA: Diagnosis not present

## 2017-03-09 LAB — TSH: TSH: 6.75 u[IU]/mL — AB (ref 0.35–4.50)

## 2017-03-09 LAB — LIPID PANEL
CHOLESTEROL: 127 mg/dL (ref 0–200)
HDL: 45.5 mg/dL (ref >39.00)
LDL Cholesterol: 62 mg/dL (ref 0–99)
NonHDL: 81.32
Total CHOL/HDL Ratio: 3
Triglycerides: 97 mg/dL (ref 0.0–149.0)
VLDL: 19.4 mg/dL (ref 0.0–40.0)

## 2017-03-09 LAB — T4, FREE: FREE T4: 0.94 ng/dL (ref 0.60–1.60)

## 2017-03-09 MED ORDER — TELMISARTAN-HCTZ 40-12.5 MG PO TABS: 1.0000 | ORAL_TABLET | Freq: Every day | ORAL | 0 refills | Status: DC

## 2017-03-09 MED ORDER — ATORVASTATIN CALCIUM 10 MG PO TABS: 10.0000 mg | ORAL_TABLET | Freq: Every day | ORAL | 0 refills | Status: DC

## 2017-03-10 LAB — THYROID ANTIBODIES
Thyroglobulin Ab: <1 IU/mL (ref <=1)
Thyroperoxidase Ab SerPl-aCnc: 1 IU/mL (ref <9)

## 2017-03-16 ENCOUNTER — Telehealth: Payer: Self-pay | Admitting: Internal Medicine

## 2017-03-16 MED ORDER — NEOMYCIN-POLYMYXIN-HC 3.5-10000-1 OP SUSP: 2.0000 [drp] | OPHTHALMIC | 0 refills | Status: DC

## 2017-03-16 NOTE — Addendum Note (Signed)
Addended by: Westley Hummer B on: 03/16/2017 04:08 PM   Modules accepted: Orders

## 2017-03-16 NOTE — Telephone Encounter (Signed)
Call in Cortisporin ophthalmic solution, 2 drops every 4 hours prn, 10 ml with no refills

## 2017-03-16 NOTE — Telephone Encounter (Signed)
Rx not available at pharmacy.  Polytrim available. rx ready

## 2017-03-16 NOTE — Telephone Encounter (Signed)
Copied from [place]. Topic: Quick Communication - See Telephone Encounter >> Mar 16, 2017  9:46 AM Synthia Innocent wrote: CRM for notification. See Telephone encounter for: Patient seen on 02/11/18, never fully got over virus, cold. Now she has pink eye, could something be called in or does she need to be seen again? [facility]  03/16/17.

## 2017-03-16 NOTE — Telephone Encounter (Signed)
Pt was seen 02/11/17 for sinusitis.   Dr. Regis Bill is not in the office.   Dr. Sarajane Jews - Please advise. Thanks!

## 2017-03-16 NOTE — Telephone Encounter (Signed)
Rx has been sent and pt was advised.

## 2017-03-28 ENCOUNTER — Encounter: Payer: Self-pay | Admitting: Internal Medicine

## 2017-03-28 DIAGNOSIS — E039 Hypothyroidism, unspecified: Secondary | ICD-10-CM

## 2017-03-30 MED ORDER — LEVOTHYROXINE SODIUM 25 MCG PO TABS: 25.0000 ug | ORAL_TABLET | Freq: Every day | ORAL | 3 refills | Status: DC

## 2017-04-24 ENCOUNTER — Encounter: Payer: Self-pay | Admitting: Family Medicine

## 2017-04-27 ENCOUNTER — Encounter: Payer: Self-pay | Admitting: Family Medicine

## 2017-04-27 ENCOUNTER — Ambulatory Visit: Payer: Medicare Other | Admitting: Family Medicine

## 2017-04-27 VITALS — BP 150/80 | HR 83 | Temp 98.6°F | Wt 158.8 lb

## 2017-04-27 DIAGNOSIS — J018 Other acute sinusitis: Secondary | ICD-10-CM | POA: Diagnosis not present

## 2017-04-27 MED ORDER — LEVOFLOXACIN 500 MG PO TABS: 500.0000 mg | ORAL_TABLET | Freq: Every day | ORAL | 0 refills | Status: AC

## 2017-04-27 NOTE — Progress Notes (Signed)
   Subjective:    Patient ID: Cheryl Robbins, female    DOB: July 24, 1945, 72 y.o.   MRN: [id number]  HPI Here for 2 weeks of sinus congestion, PND, ST and a dry cough/no fever. On Mucinex.    Review of Systems  Constitutional: Negative.   HENT: Positive for congestion, postnasal drip, sinus pressure and sore throat. Negative for sinus pain.   Eyes: Negative.   Respiratory: Positive for cough.        Objective:   Physical Exam  Constitutional: She appears well-developed and well-nourished.  HENT:  Right Ear: External ear normal.  Left Ear: External ear normal.  Nose: Nose normal.  Mouth/Throat: Oropharynx is clear and moist.  Eyes: Conjunctivae are normal.  Neck: No thyromegaly present.  Pulmonary/Chest: Effort normal and breath sounds normal. No respiratory distress. She has no wheezes. She has no rales.  Lymphadenopathy:    She has no cervical adenopathy.          Assessment & Plan:  Sinusitis, treat with Levaquin. Alysia Penna, MD

## 2017-05-04 ENCOUNTER — Telehealth: Payer: Self-pay | Admitting: *Deleted

## 2017-05-04 NOTE — Telephone Encounter (Signed)
Prior auth for Promethazine HCI 12.5mg  suppositories sent to [email or web address].

## 2017-06-21 ENCOUNTER — Encounter: Payer: Self-pay | Admitting: Family Medicine

## 2017-06-22 ENCOUNTER — Other Ambulatory Visit: Payer: Self-pay | Admitting: Internal Medicine

## 2017-06-22 DIAGNOSIS — E039 Hypothyroidism, unspecified: Secondary | ICD-10-CM

## 2017-07-06 ENCOUNTER — Other Ambulatory Visit (INDEPENDENT_AMBULATORY_CARE_PROVIDER_SITE_OTHER): Payer: Medicare Other

## 2017-07-06 DIAGNOSIS — E039 Hypothyroidism, unspecified: Secondary | ICD-10-CM | POA: Diagnosis not present

## 2017-07-06 LAB — TSH: TSH: 0.82 u[IU]/mL (ref 0.35–4.50)

## 2017-08-27 ENCOUNTER — Encounter: Payer: Self-pay | Admitting: Family Medicine

## 2017-09-03 NOTE — Progress Notes (Signed)
Chief Complaint  Patient presents with  . Annual Exam    Discuss migraine medication    HPI: Cheryl Robbins 72 y.o. comes in today for Preventive Medicare exam/ wellness visit .Since last visit.   Doing well    Phenergan supp used in past for vomiting with migraina dn last need was 3 years ago  Asked for refill but  Medicare wont pay  Wants to have something on hand in case.   Bp readings log brough tin with patient today   120/74 - ocass 148/86 but most below 140/90  12 out of 16 readings  Pulse in 70 - 80s   Taking thryoid med   Lipid med  Taking  4 days week rent room in [place] to take caer of 3 grand children   Health Maintenance  Topic Date Due  . INFLUENZA VACCINE  09/17/2017  . MAMMOGRAM  01/13/2018  . COLONOSCOPY  02/26/2023  . TETANUS/TDAP  08/21/2025  . DEXA SCAN  Completed  . Hepatitis C Screening  Completed  . PNA vac Low Risk Adult  Completed   Health Maintenance Review LIFESTYLE:  Exercise:   3 toddlers    Care. 4 days a  week.  Tobacco/ETS: no Alcohol: no Sugar beverages: ocass  Sleep:  Better  6- 8  Drug use: no HH: 1 no pets  Widowed  Allergist yearly eye yearly  Derm  Every 18  Months    Hearing:  Ok   Vision:  No limitations at present . Last eye check UTD glasses   Safety:  Has smoke detector and wears seat belts. . No excess sun exposure. Sees dentist regularly.  Falls: n.  Memory: Felt to be good  , no concern from her or her family.  Depression: No anhedonia unusual crying or depressive symptoms  Nutrition: Eats well balanced diet; adequate calcium and vitamin D. No swallowing chewing problems.  Injury: no major injuries in the last six months.  Other healthcare providers:  Reviewed today .   Preventive parameters: up-to-date  Reviewed   ADLS:   There are no problems or need for assistance  driving, feeding, obtaining food, dressing, toileting and bathing, managing money using phone. She is independent.  NO porb UI   ROS:   GEN/ HEENT: No fever, significant weight changes sweats headaches vision problems hearing changes, CV/ PULM; No chest pain shortness of breath cough, syncope,edema  change in exercise tolerance. GI /GU: No adominal pain, vomiting, change in bowel habits. No blood in the stool. No significant GU symptoms. SKIN/HEME: ,no acute skin rashes suspicious lesions or bleeding. No lymphadenopathy, nodules, masses.  NEURO/ PSYCH:  No neurologic signs such as weakness numbness. No depression anxiety. IMM/ Allergy: No unusual infections.  Allergy .   REST of 12 system review negative except as per HPI   Past Medical History:  Diagnosis Date  . Allergy   . GERD (gastroesophageal reflux disease)   . Headache(784.0)   . Hyperlipidemia   . Hypertension   . Low back pain   . Osteopenia   . 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 Primiparous   . Rosacea     Family History  Problem Relation Age of Onset  . Atrial fibrillation Mother        had cv in her 12 s  . Arthritis Mother        had staph infection knee replacment  . Cancer Sister  thryoid  . Fibromyalgia Brother   . Colon cancer Neg Hx   . Rectal cancer Neg Hx   . Stomach cancer Neg Hx     Social History   Socioeconomic History  . Marital status: Married    Spouse name: Not on file  . Number of children: Not on file  . Years of education: Not on file  . Highest education level: Not on file  Occupational History  . Not on file  Social Needs  . Financial resource strain: Not on file  . Food insecurity:    Worry: Not on file    Inability: Not on file  . Transportation needs:    Medical: Not on file    Non-medical: Not on file  Tobacco Use  . Smoking status: Never Smoker  . Smokeless tobacco: Never Used  Substance and Sexual Activity  . Alcohol use: No  . Drug use: No  . Sexual activity: Not on file  Lifestyle  . Physical activity:    Days per week: Not on file     Minutes per session: Not on file  . Stress: Not on file  Relationships  . Social connections:    Talks on phone: Not on file    Gets together: Not on file    Attends religious service: Not on file    Active member of club or organization: Not on file    Attends meetings of clubs or organizations: Not on file    Relationship status: Not on file  Other Topics Concern  . Not on file  Social History Narrative   hh of 1   No pets   No ets.   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 acetaminophen (TYLENOL) 500 MG tablet Take 1,000 mg by mouth every 6 (six) hours as needed.  Marland Kitchen atorvastatin (LIPITOR) 10 MG tablet TAKE 1 TABLET BY MOUTH EVERY DAY  . beta carotene w/minerals (OCUVITE) tablet Take 1 tablet by mouth daily.  . Calcium Carbonate-Vitamin D (CALTRATE 600+D) 600-400 MG-UNIT per tablet Take 1 tablet by mouth daily.  Marland Kitchen co-enzyme Q-10 50 MG capsule Take 150 mg by mouth daily.   . diphenhydrAMINE (BENADRYL) 25 MG tablet Take 25 mg by mouth every 6 (six) hours as needed.  . fluocinonide-emollient (LIDEX-E) 0.05 % cream Apply 1 application topically 2 (two) times daily.  . fluticasone (FLONASE) 50 MCG/ACT nasal spray Place into both nostrils daily.  Marland Kitchen guaiFENesin (MUCINEX) 600 MG 12 hr tablet Take by mouth 2 (two) times daily.  Marland Kitchen ibuprofen (ADVIL,MOTRIN) 200 MG tablet Take 200 mg by mouth every 6 (six) hours as needed.  Marland Kitchen levothyroxine (SYNTHROID, LEVOTHROID) 25 MCG tablet Take 1 tablet (25 mcg total) by mouth daily before breakfast.  . loratadine (CLARITIN) 10 MG tablet Take 10 mg by mouth daily.  Marland Kitchen omeprazole (PRILOSEC) 20 MG capsule Take 20 mg by mouth daily.  . promethazine (PHENERGAN) 12.5 MG suppository Place 1 suppository (12.5 mg total) rectally every 4 (four) hours as  needed.  Marland Kitchen telmisartan-hydrochlorothiazide (MICARDIS HCT) 40-12.5 MG tablet Take 1 tablet by mouth daily.  . Turmeric 500 MG TABS Take by mouth.  . valsartan-hydrochlorothiazide (DIOVAN-HCT) 160-12.5 MG tablet Take 1 tablet by mouth daily.  Marland Kitchen  ondansetron (ZOFRAN-ODT) 4 MG disintegrating tablet Take 1 tablet (4 mg total) by mouth every 8 (eight) hours as needed for nausea or vomiting (migraine).  . [DISCONTINUED] neomycin-polymyxin-hydrocortisone (CORTISPORIN) 3.5-10000-1 ophthalmic suspension Place 2 drops into both eyes every 4 (four) hours. (Patient not taking: Reported on 09/07/2017)  . [DISCONTINUED] telmisartan-hydrochlorothiazide (MICARDIS HCT) 40-12.5 MG tablet TAKE 1 TABLET BY MOUTH EVERY DAY (Patient not taking: Reported on 09/07/2017)  . [DISCONTINUED] trimethoprim-polymyxin b (POLYTRIM) ophthalmic solution Place 2 drops into both eyes every 4 (four) hours.   No facility-administered encounter medications on file as of 09/07/2017.     EXAM:  BP 120/74 Comment: at home  readings  see texts  Pulse 76   Temp 98.5 F (36.9 C) (Oral)   Ht 5' 4"  (1.626 m)   Wt 156 lb 12.8 oz (71.1 kg)   BMI 26.91 kg/m   Body mass index is 26.91 kg/m.  Physical Exam: Vital signs reviewed PYK:DXIP is a well-developed well-nourished alert cooperative   who appears stated age in no acute distress.  HEENT: normocephalic atraumatic , Eyes: PERRL EOM's full, conjunctiva clear, Nares: paten,t no deformity discharge or tenderness., Ears: no deformity EAC's clear TMs with normal landmarks. Mouth: clear OP, no lesions, edema.  Moist mucous membranes. Dentition in adequate repair. NECK: supple without masses, thyromegaly or bruits. CHEST/PULM:  Clear to auscultation and percussion breath sounds equal no wheeze , rales or rhonchi. No chest wall deformities or tenderness.Breast: normal by inspection . No dimpling, discharge, masses, tenderness or discharge . CV: PMI is nondisplaced, S1 S2 no gallops, murmurs,  rubs. Peripheral pulses are full without delay.No JVD .  ABDOMEN: Bowel sounds normal nontender  No guard or rebound, no hepato splenomegal no CVA tenderness.   Extremtities:  No clubbing cyanosis or edema, no acute joint swelling or redness no focal atrophy NEURO:  Oriented x3, cranial nerves 3-12 appear to be intact,mild limp ( hx polio ) no other gross deficits l SKIN: No acute rashes normal turgor, color, no bruising or petechiae. PSYCH: Oriented, good eye contact, no obvious depression anxiety, cognition and judgment appear normal. LN: no cervical axillary inguinal adenopathy No noted deficits in memory, attention, and speech.     ASSESSMENT AND PLAN:  Discussed the following assessment and plan:  Visit for preventive health examination  Hypothyroidism, unspecified type - autoimmune pos antibodies   on med follow - Plan: Basic metabolic panel, CBC with Differential/Platelet, Hepatic function panel, Lipid panel, TSH  Essential hypertension - home readings controlled  same meds  - Plan: Basic metabolic panel, CBC with Differential/Platelet, Hepatic function panel, Lipid panel, TSH  Medication management - try zofran instead of phenergan supp if needed  - Plan: Basic metabolic panel, CBC with Differential/Platelet, Hepatic function panel, Lipid panel, TSH  Hyperlipidemia, unspecified hyperlipidemia type - Plan: Basic metabolic panel, CBC with Differential/Platelet, Hepatic function panel, Lipid panel, TSH  Hx of migraine headaches  Patient Care Team: Panosh, Standley Brooking, MD as PCP - General Ernesto Rutherford, Maudry Mayhew, MD (Otolaryngology) Allyn Kenner, MD (Dermatology) Latanya Maudlin, MD (Orthopedic Surgery) ed Eddie Dibbles (Ophthalmology) Tiajuana Amass, MD as Consulting Physician (Allergy and Immunology)  Patient Instructions  Glad you are doing well  Try dissolvable  zofran for nausea vomitng with migraine if needed.  Continue lifestyle intervention healthy eating and exercise . Will notify you   of labs when available. Shingrix vaccine   At your pharmacy .   If all ok  See you in  A year for  Yearly check exam  Health Maintenance, Female Adopting a healthy lifestyle and getting preventive care can go a long way to promote health and wellness. Talk with your health care provider about what schedule of regular examinations is right for you. This is a good chance for you to check in with your provider about disease prevention and staying healthy. In between checkups, there are plenty of things you can do on your own. Experts have done a lot of research about which lifestyle changes and preventive measures are most likely to keep you healthy. Ask your health care provider for more information. Weight and diet Eat a healthy diet  Be sure to include plenty of vegetables, fruits, low-fat dairy products, and lean protein.  Do not eat a lot of foods high in solid fats, added sugars, or salt.  Get regular exercise. This is one of the most important things you can do for your health. ? Most adults should exercise for at least 150 minutes each week. The exercise should increase your heart rate and make you sweat (moderate-intensity exercise). ? Most adults should also do strengthening exercises at least twice a week. This is in addition to the moderate-intensity exercise.  Maintain a healthy weight  Body mass index (BMI) is a measurement that can be used to identify possible weight problems. It estimates body fat based on height and weight. Your health care provider can help determine your BMI and help you achieve or maintain a healthy weight.  For females 28 years of age and older: ? A BMI below 18.5 is considered underweight. ? A BMI of 18.5 to 24.9 is normal. ? A BMI of 25 to 29.9 is considered overweight. ? A BMI of 30 and above is considered obese.  Watch levels of cholesterol and blood lipids  You should start having your blood tested for lipids and cholesterol at 73 years of age,  then have this test every 5 years.  You may need to have your cholesterol levels checked more often if: ? Your lipid or cholesterol levels are high. ? You are older than 73 years of age. ? You are at high risk for heart disease.  Cancer screening Lung Cancer  Lung cancer screening is recommended for adults 43-40 years old who are at high risk for lung cancer because of a history of smoking.  A yearly low-dose CT scan of the lungs is recommended for people who: ? Currently smoke. ? Have quit within the past 15 years. ? Have at least a 30-pack-year history of smoking. A pack year is smoking an average of one pack of cigarettes a day for 1 year.  Yearly screening should continue until it has been 15 years since you quit.  Yearly screening should stop if you develop a health problem that would prevent you from having lung cancer treatment.  Breast Cancer  Practice breast self-awareness. This means understanding how your breasts normally appear and feel.  It also means doing regular breast self-exams. Let your health care provider know about any changes, no matter how small.  If you are in your 20s or 30s, you should have a clinical breast exam (CBE) by a health care provider every 1-3 years as part of a regular health exam.  If you are 14 or older, have a CBE every year. Also consider having a breast X-ray (mammogram) every year.  If you have a family history of breast cancer, talk to your health care provider about genetic screening.  If you are at high risk  for breast cancer, talk to your health care provider about having an MRI and a mammogram every year.  Breast cancer gene (BRCA) assessment is recommended for women who have family members with BRCA-related cancers. BRCA-related cancers include: ? Breast. ? Ovarian. ? Tubal. ? Peritoneal cancers.  Results of the assessment will determine the need for genetic counseling and BRCA1 and BRCA2 testing.  Cervical Cancer Your  health care provider may recommend that you be screened regularly for cancer of the pelvic organs (ovaries, uterus, and vagina). This screening involves a pelvic examination, including checking for microscopic changes to the surface of your cervix (Pap test). You may be encouraged to have this screening done every 3 years, beginning at age 84.  For women ages 21-65, health care providers may recommend pelvic exams and Pap testing every 3 years, or they may recommend the Pap and pelvic exam, combined with testing for human papilloma virus (HPV), every 5 years. Some types of HPV increase your risk of cervical cancer. Testing for HPV may also be done on women of any age with unclear Pap test results.  Other health care providers may not recommend any screening for nonpregnant women who are considered low risk for pelvic cancer and who do not have symptoms. Ask your health care provider if a screening pelvic exam is right for you.  If you have had past treatment for cervical cancer or a condition that could lead to cancer, you need Pap tests and screening for cancer for at least 20 years after your treatment. If Pap tests have been discontinued, your risk factors (such as having a new sexual partner) need to be reassessed to determine if screening should resume. Some women have medical problems that increase the chance of getting cervical cancer. In these cases, your health care provider may recommend more frequent screening and Pap tests.  Colorectal Cancer  This type of cancer can be detected and often prevented.  Routine colorectal cancer screening usually begins at 73 years of age and continues through 73 years of age.  Your health care provider may recommend screening at an earlier age if you have risk factors for colon cancer.  Your health care provider may also recommend using home test kits to check for hidden blood in the stool.  A small camera at the end of a tube can be used to examine your  colon directly (sigmoidoscopy or colonoscopy). This is done to check for the earliest forms of colorectal cancer.  Routine screening usually begins at age 57.  Direct examination of the colon should be repeated every 5-10 years through 73 years of age. However, you may need to be screened more often if early forms of precancerous polyps or small growths are found.  Skin Cancer  Check your skin from head to toe regularly.  Tell your health care provider about any new moles or changes in moles, especially if there is a change in a mole's shape or color.  Also tell your health care provider if you have a mole that is larger than the size of a pencil eraser.  Always use sunscreen. Apply sunscreen liberally and repeatedly throughout the day.  Protect yourself by wearing long sleeves, pants, a wide-brimmed hat, and sunglasses whenever you are outside.  Heart disease, diabetes, and high blood pressure  High blood pressure causes heart disease and increases the risk of stroke. High blood pressure is more likely to develop in: ? People who have blood pressure in the high end of  the normal range (130-139/85-89 mm Hg). ? People who are overweight or obese. ? People who are African American.  If you are 57-59 years of age, have your blood pressure checked every 3-5 years. If you are 3 years of age or older, have your blood pressure checked every year. You should have your blood pressure measured twice-once when you are at a hospital or clinic, and once when you are not at a hospital or clinic. Record the average of the two measurements. To check your blood pressure when you are not at a hospital or clinic, you can use: ? An automated blood pressure machine at a pharmacy. ? A home blood pressure monitor.  If you are between 76 years and 40 years old, ask your health care provider if you should take aspirin to prevent strokes.  Have regular diabetes screenings. This involves taking a blood sample  to check your fasting blood sugar level. ? If you are at a normal weight and have a low risk for diabetes, have this test once every three years after 73 years of age. ? If you are overweight and have a high risk for diabetes, consider being tested at a younger age or more often. Preventing infection Hepatitis B  If you have a higher risk for hepatitis B, you should be screened for this virus. You are considered at high risk for hepatitis B if: ? You were born in a country where hepatitis B is common. Ask your health care provider which countries are considered high risk. ? Your parents were born in a high-risk country, and you have not been immunized against hepatitis B (hepatitis B vaccine). ? You have HIV or AIDS. ? You use needles to inject street drugs. ? You live with someone who has hepatitis B. ? You have had sex with someone who has hepatitis B. ? You get hemodialysis treatment. ? You take certain medicines for conditions, including cancer, organ transplantation, and autoimmune conditions.  Hepatitis C  Blood testing is recommended for: ? Everyone born from [birth year] through 1966. ? Anyone with known risk factors for hepatitis C.  Sexually transmitted infections (STIs)  You should be screened for sexually transmitted infections (STIs) including gonorrhea and chlamydia if: ? You are sexually active and are younger than 73 years of age. ? You are older than 73 years of age and your health care provider tells you that you are at risk for this type of infection. ? Your sexual activity has changed since you were last screened and you are at an increased risk for chlamydia or gonorrhea. Ask your health care provider if you are at risk.  If you do not have HIV, but are at risk, it may be recommended that you take a prescription medicine daily to prevent HIV infection. This is called pre-exposure prophylaxis (PrEP). You are considered at risk if: ? You are sexually active and do not  regularly use condoms or know the HIV status of your partner(s). ? You take drugs by injection. ? You are sexually active with a partner who has HIV.  Talk with your health care provider about whether you are at high risk of being infected with HIV. If you choose to begin PrEP, you should first be tested for HIV. You should then be tested every 3 months for as long as you are taking PrEP. Pregnancy  If you are premenopausal and you may become pregnant, ask your health care provider about preconception counseling.  If you may become  pregnant, take 400 to 800 micrograms (mcg) of folic acid every day.  If you want to prevent pregnancy, talk to your health care provider about birth control (contraception). Osteoporosis and menopause  Osteoporosis is a disease in which the bones lose minerals and strength with aging. This can result in serious bone fractures. Your risk for osteoporosis can be identified using a bone density scan.  If you are 39 years of age or older, or if you are at risk for osteoporosis and fractures, ask your health care provider if you should be screened.  Ask your health care provider whether you should take a calcium or vitamin D supplement to lower your risk for osteoporosis.  Menopause may have certain physical symptoms and risks.  Hormone replacement therapy may reduce some of these symptoms and risks. Talk to your health care provider about whether hormone replacement therapy is right for you. Follow these instructions at home:  Schedule regular health, dental, and eye exams.  Stay current with your immunizations.  Do not use any tobacco products including cigarettes, chewing tobacco, or electronic cigarettes.  If you are pregnant, do not drink alcohol.  If you are breastfeeding, limit how much and how often you drink alcohol.  Limit alcohol intake to no more than 1 drink per day for nonpregnant women. One drink equals 12 ounces of beer, 5 ounces of wine, or  1 ounces of hard liquor.  Do not use street drugs.  Do not share needles.  Ask your health care provider for help if you need support or information about quitting drugs.  Tell your health care provider if you often feel depressed.  Tell your health care provider if you have ever been abused or do not feel safe at home. This information is not intended to replace advice given to you by your health care provider. Make sure you discuss any questions you have with your health care provider. Document Released: 08/19/2010 Document Revised: 07/12/2015 Document Reviewed: 11/07/2014 Elsevier Interactive Patient Education  2018 [place]. Panosh M.D.

## 2017-09-07 ENCOUNTER — Ambulatory Visit (INDEPENDENT_AMBULATORY_CARE_PROVIDER_SITE_OTHER): Payer: Medicare Other | Admitting: Internal Medicine

## 2017-09-07 ENCOUNTER — Encounter: Payer: Self-pay | Admitting: Internal Medicine

## 2017-09-07 VITALS — BP 120/74 | HR 76 | Temp 98.5°F | Ht 64.0 in | Wt 156.8 lb

## 2017-09-07 DIAGNOSIS — Z79899 Other long term (current) drug therapy: Secondary | ICD-10-CM

## 2017-09-07 DIAGNOSIS — E785 Hyperlipidemia, unspecified: Secondary | ICD-10-CM

## 2017-09-07 DIAGNOSIS — Z8669 Personal history of other diseases of the nervous system and sense organs: Secondary | ICD-10-CM | POA: Diagnosis not present

## 2017-09-07 DIAGNOSIS — Z0000 Encounter for general adult medical examination without abnormal findings: Secondary | ICD-10-CM

## 2017-09-07 DIAGNOSIS — E039 Hypothyroidism, unspecified: Secondary | ICD-10-CM

## 2017-09-07 DIAGNOSIS — I10 Essential (primary) hypertension: Secondary | ICD-10-CM | POA: Diagnosis not present

## 2017-09-07 LAB — CBC WITH DIFFERENTIAL/PLATELET
Basophils Absolute: 0 10*3/uL (ref 0.0–0.1)
Basophils Relative: 0.2 % (ref 0.0–3.0)
Eosinophils Absolute: 0.1 10*3/uL (ref 0.0–0.7)
Eosinophils Relative: 2.6 % (ref 0.0–5.0)
HCT: 42.8 % (ref 36.0–46.0)
HEMOGLOBIN: 14.6 g/dL (ref 12.0–15.0)
Lymphocytes Relative: 32 % (ref 12.0–46.0)
Lymphs Abs: 1.2 10*3/uL (ref 0.7–4.0)
MCHC: 34 g/dL (ref 30.0–36.0)
MCV: 85.7 fl (ref 78.0–100.0)
Monocytes Absolute: 0.3 10*3/uL (ref 0.1–1.0)
Monocytes Relative: 6.5 % (ref 3.0–12.0)
NEUTROS PCT: 58.7 % (ref 43.0–77.0)
Neutro Abs: 2.3 10*3/uL (ref 1.4–7.7)
Platelets: 169 10*3/uL (ref 150.0–400.0)
RBC: 5 Mil/uL (ref 3.87–5.11)
RDW: 13.3 % (ref 11.5–15.5)
WBC: 3.8 10*3/uL — AB (ref 4.0–10.5)

## 2017-09-07 LAB — HEPATIC FUNCTION PANEL
ALT: 17 U/L (ref 0–35)
AST: 10 U/L (ref 0–37)
Albumin: 4.4 g/dL (ref 3.5–5.2)
Alkaline Phosphatase: 51 U/L (ref 39–117)
Bilirubin, Direct: 0.1 mg/dL (ref 0.0–0.3)
Total Bilirubin: 0.7 mg/dL (ref 0.2–1.2)
Total Protein: 6.6 g/dL (ref 6.0–8.3)

## 2017-09-07 LAB — LIPID PANEL
Cholesterol: 126 mg/dL (ref 0–200)
HDL: 36.1 mg/dL — ABNORMAL LOW (ref >39.00)
LDL Cholesterol: 67 mg/dL (ref 0–99)
NonHDL: 90.17
TRIGLYCERIDES: 114 mg/dL (ref 0.0–149.0)
Total CHOL/HDL Ratio: 3
VLDL: 22.8 mg/dL (ref 0.0–40.0)

## 2017-09-07 LAB — BASIC METABOLIC PANEL
BUN: 11 mg/dL (ref 6–23)
CALCIUM: 9.7 mg/dL (ref 8.4–10.5)
CO2: 31 mEq/L (ref 19–32)
Chloride: 98 mEq/L (ref 96–112)
Creatinine, Ser: 0.84 mg/dL (ref 0.40–1.20)
GFR: 70.65 mL/min (ref >60.00)
GLUCOSE: 107 mg/dL — AB (ref 70–99)
Potassium: 4.3 mEq/L (ref 3.5–5.1)
Sodium: 135 mEq/L (ref 135–145)

## 2017-09-07 LAB — TSH: TSH: 1.24 u[IU]/mL (ref 0.35–4.50)

## 2017-09-07 MED ORDER — ONDANSETRON 4 MG PO TBDP
4.0000 mg | ORAL_TABLET | Freq: Three times a day (TID) | ORAL | 1 refills | Status: DC | PRN
Start: 2017-09-07 — End: 2020-06-18

## 2017-09-07 NOTE — Patient Instructions (Signed)
Glad you are doing well  Try dissolvable  zofran for nausea vomitng with migraine if needed.  Continue lifestyle intervention healthy eating and exercise . Will notify you  of labs when available. Shingrix vaccine   At your pharmacy .   If all ok  See you in  A year for  Yearly check exam   Health Maintenance, Female Adopting a healthy lifestyle and getting preventive care can go a long way to promote health and wellness. Talk with your health care provider about what schedule of regular examinations is right for you. This is a good chance for you to check in with your provider about disease prevention and staying healthy. In between checkups, there are plenty of things you can do on your own. Experts have done a lot of research about which lifestyle changes and preventive measures are most likely to keep you healthy. Ask your health care provider for more information. Weight and diet Eat a healthy diet  Be sure to include plenty of vegetables, fruits, low-fat dairy products, and lean protein.  Do not eat a lot of foods high in solid fats, added sugars, or salt.  Get regular exercise. This is one of the most important things you can do for your health. ? Most adults should exercise for at least 150 minutes each week. The exercise should increase your heart rate and make you sweat (moderate-intensity exercise). ? Most adults should also do strengthening exercises at least twice a week. This is in addition to the moderate-intensity exercise.  Maintain a healthy weight  Body mass index (BMI) is a measurement that can be used to identify possible weight problems. It estimates body fat based on height and weight. Your health care provider can help determine your BMI and help you achieve or maintain a healthy weight.  For females 37 years of age and older: ? A BMI below 18.5 is considered underweight. ? A BMI of 18.5 to 24.9 is normal. ? A BMI of 25 to 29.9 is considered overweight. ? A BMI  of 30 and above is considered obese.  Watch levels of cholesterol and blood lipids  You should start having your blood tested for lipids and cholesterol at 73 years of age, then have this test every 5 years.  You may need to have your cholesterol levels checked more often if: ? Your lipid or cholesterol levels are high. ? You are older than 73 years of age. ? You are at high risk for heart disease.  Cancer screening Lung Cancer  Lung cancer screening is recommended for adults 55-52 years old who are at high risk for lung cancer because of a history of smoking.  A yearly low-dose CT scan of the lungs is recommended for people who: ? Currently smoke. ? Have quit within the past 15 years. ? Have at least a 30-pack-year history of smoking. A pack year is smoking an average of one pack of cigarettes a day for 1 year.  Yearly screening should continue until it has been 15 years since you quit.  Yearly screening should stop if you develop a health problem that would prevent you from having lung cancer treatment.  Breast Cancer  Practice breast self-awareness. This means understanding how your breasts normally appear and feel.  It also means doing regular breast self-exams. Let your health care provider know about any changes, no matter how small.  If you are in your 20s or 30s, you should have a clinical breast exam (CBE) by  a health care provider every 1-3 years as part of a regular health exam.  If you are 53 or older, have a CBE every year. Also consider having a breast X-ray (mammogram) every year.  If you have a family history of breast cancer, talk to your health care provider about genetic screening.  If you are at high risk for breast cancer, talk to your health care provider about having an MRI and a mammogram every year.  Breast cancer gene (BRCA) assessment is recommended for women who have family members with BRCA-related cancers. BRCA-related cancers  include: ? Breast. ? Ovarian. ? Tubal. ? Peritoneal cancers.  Results of the assessment will determine the need for genetic counseling and BRCA1 and BRCA2 testing.  Cervical Cancer Your health care provider may recommend that you be screened regularly for cancer of the pelvic organs (ovaries, uterus, and vagina). This screening involves a pelvic examination, including checking for microscopic changes to the surface of your cervix (Pap test). You may be encouraged to have this screening done every 3 years, beginning at age 77.  For women ages 67-65, health care providers may recommend pelvic exams and Pap testing every 3 years, or they may recommend the Pap and pelvic exam, combined with testing for human papilloma virus (HPV), every 5 years. Some types of HPV increase your risk of cervical cancer. Testing for HPV may also be done on women of any age with unclear Pap test results.  Other health care providers may not recommend any screening for nonpregnant women who are considered low risk for pelvic cancer and who do not have symptoms. Ask your health care provider if a screening pelvic exam is right for you.  If you have had past treatment for cervical cancer or a condition that could lead to cancer, you need Pap tests and screening for cancer for at least 20 years after your treatment. If Pap tests have been discontinued, your risk factors (such as having a new sexual partner) need to be reassessed to determine if screening should resume. Some women have medical problems that increase the chance of getting cervical cancer. In these cases, your health care provider may recommend more frequent screening and Pap tests.  Colorectal Cancer  This type of cancer can be detected and often prevented.  Routine colorectal cancer screening usually begins at 73 years of age and continues through 73 years of age.  Your health care provider may recommend screening at an earlier age if you have risk factors  for colon cancer.  Your health care provider may also recommend using home test kits to check for hidden blood in the stool.  A small camera at the end of a tube can be used to examine your colon directly (sigmoidoscopy or colonoscopy). This is done to check for the earliest forms of colorectal cancer.  Routine screening usually begins at age 76.  Direct examination of the colon should be repeated every 5-10 years through 73 years of age. However, you may need to be screened more often if early forms of precancerous polyps or small growths are found.  Skin Cancer  Check your skin from head to toe regularly.  Tell your health care provider about any new moles or changes in moles, especially if there is a change in a mole's shape or color.  Also tell your health care provider if you have a mole that is larger than the size of a pencil eraser.  Always use sunscreen. Apply sunscreen liberally and repeatedly  throughout the day.  Protect yourself by wearing long sleeves, pants, a wide-brimmed hat, and sunglasses whenever you are outside.  Heart disease, diabetes, and high blood pressure  High blood pressure causes heart disease and increases the risk of stroke. High blood pressure is more likely to develop in: ? People who have blood pressure in the high end of the normal range (130-139/85-89 mm Hg). ? People who are overweight or obese. ? People who are African American.  If you are 82-37 years of age, have your blood pressure checked every 3-5 years. If you are 52 years of age or older, have your blood pressure checked every year. You should have your blood pressure measured twice-once when you are at a hospital or clinic, and once when you are not at a hospital or clinic. Record the average of the two measurements. To check your blood pressure when you are not at a hospital or clinic, you can use: ? An automated blood pressure machine at a pharmacy. ? A home blood pressure monitor.  If  you are between 25 years and 33 years old, ask your health care provider if you should take aspirin to prevent strokes.  Have regular diabetes screenings. This involves taking a blood sample to check your fasting blood sugar level. ? If you are at a normal weight and have a low risk for diabetes, have this test once every three years after 73 years of age. ? If you are overweight and have a high risk for diabetes, consider being tested at a younger age or more often. Preventing infection Hepatitis B  If you have a higher risk for hepatitis B, you should be screened for this virus. You are considered at high risk for hepatitis B if: ? You were born in a country where hepatitis B is common. Ask your health care provider which countries are considered high risk. ? Your parents were born in a high-risk country, and you have not been immunized against hepatitis B (hepatitis B vaccine). ? You have HIV or AIDS. ? You use needles to inject street drugs. ? You live with someone who has hepatitis B. ? You have had sex with someone who has hepatitis B. ? You get hemodialysis treatment. ? You take certain medicines for conditions, including cancer, organ transplantation, and autoimmune conditions.  Hepatitis C  Blood testing is recommended for: ? Everyone born from [birth year] through 1966. ? Anyone with known risk factors for hepatitis C.  Sexually transmitted infections (STIs)  You should be screened for sexually transmitted infections (STIs) including gonorrhea and chlamydia if: ? You are sexually active and are younger than 73 years of age. ? You are older than 73 years of age and your health care provider tells you that you are at risk for this type of infection. ? Your sexual activity has changed since you were last screened and you are at an increased risk for chlamydia or gonorrhea. Ask your health care provider if you are at risk.  If you do not have HIV, but are at risk, it may be recommended  that you take a prescription medicine daily to prevent HIV infection. This is called pre-exposure prophylaxis (PrEP). You are considered at risk if: ? You are sexually active and do not regularly use condoms or know the HIV status of your partner(s). ? You take drugs by injection. ? You are sexually active with a partner who has HIV.  Talk with your health care provider about whether you are  at high risk of being infected with HIV. If you choose to begin PrEP, you should first be tested for HIV. You should then be tested every 3 months for as long as you are taking PrEP. Pregnancy  If you are premenopausal and you may become pregnant, ask your health care provider about preconception counseling.  If you may become pregnant, take 400 to 800 micrograms (mcg) of folic acid every day.  If you want to prevent pregnancy, talk to your health care provider about birth control (contraception). Osteoporosis and menopause  Osteoporosis is a disease in which the bones lose minerals and strength with aging. This can result in serious bone fractures. Your risk for osteoporosis can be identified using a bone density scan.  If you are 73 years of age or older, or if you are at risk for osteoporosis and fractures, ask your health care provider if you should be screened.  Ask your health care provider whether you should take a calcium or vitamin D supplement to lower your risk for osteoporosis.  Menopause may have certain physical symptoms and risks.  Hormone replacement therapy may reduce some of these symptoms and risks. Talk to your health care provider about whether hormone replacement therapy is right for you. Follow these instructions at home:  Schedule regular health, dental, and eye exams.  Stay current with your immunizations.  Do not use any tobacco products including cigarettes, chewing tobacco, or electronic cigarettes.  If you are pregnant, do not drink alcohol.  If you are  breastfeeding, limit how much and how often you drink alcohol.  Limit alcohol intake to no more than 1 drink per day for nonpregnant women. One drink equals 12 ounces of beer, 5 ounces of wine, or 1 ounces of hard liquor.  Do not use street drugs.  Do not share needles.  Ask your health care provider for help if you need support or information about quitting drugs.  Tell your health care provider if you often feel depressed.  Tell your health care provider if you have ever been abused or do not feel safe at home. This information is not intended to replace advice given to you by your health care provider. Make sure you discuss any questions you have with your health care provider. Document Released: 08/19/2010 Document Revised: 07/12/2015 Document Reviewed: 11/07/2014 Elsevier Interactive Patient Education  [employer].

## 2017-09-12 IMAGING — DX DG LUMBAR SPINE COMPLETE 4+V
5 series · 5 of 5 positions shown · non-contrast
Comparison: No recent prior.

CLINICAL DATA: Chronic pain.

EXAM:
LUMBAR SPINE - COMPLETE 4+ VIEW

[l-spine ap]
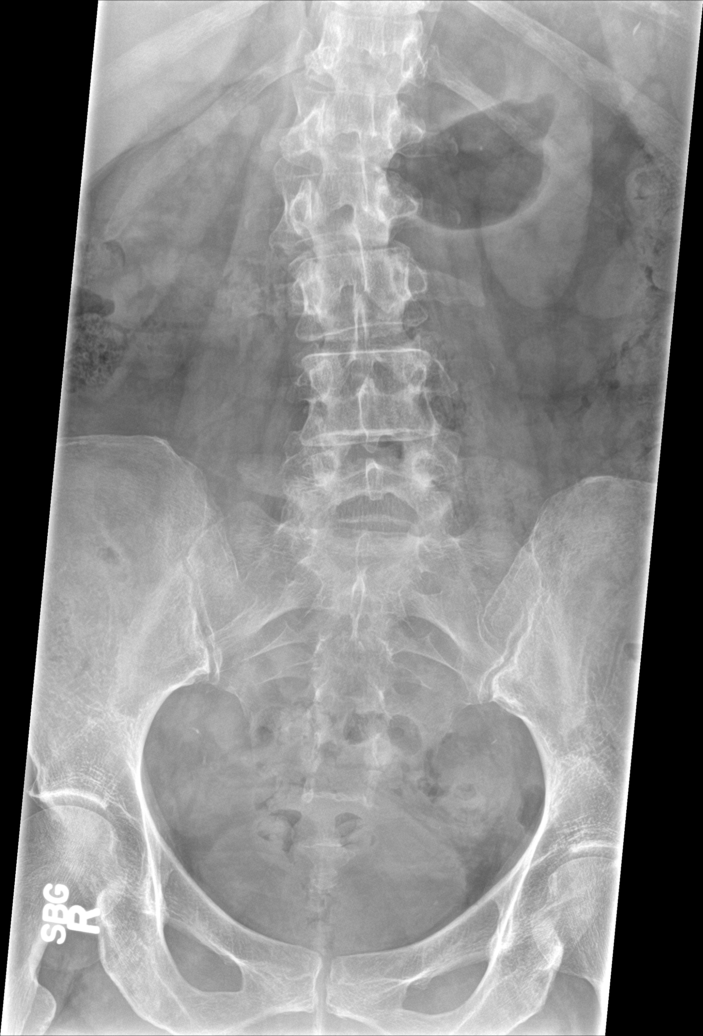

[l-spine obl (1 of 2)]
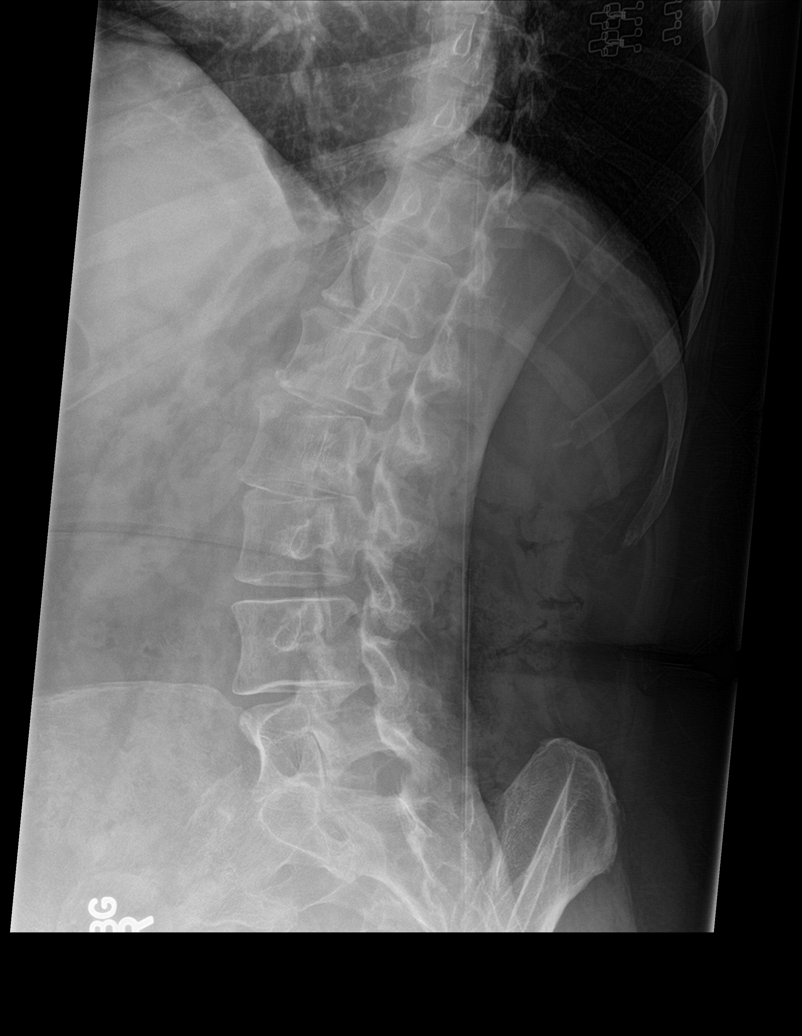

[l-spine lat]
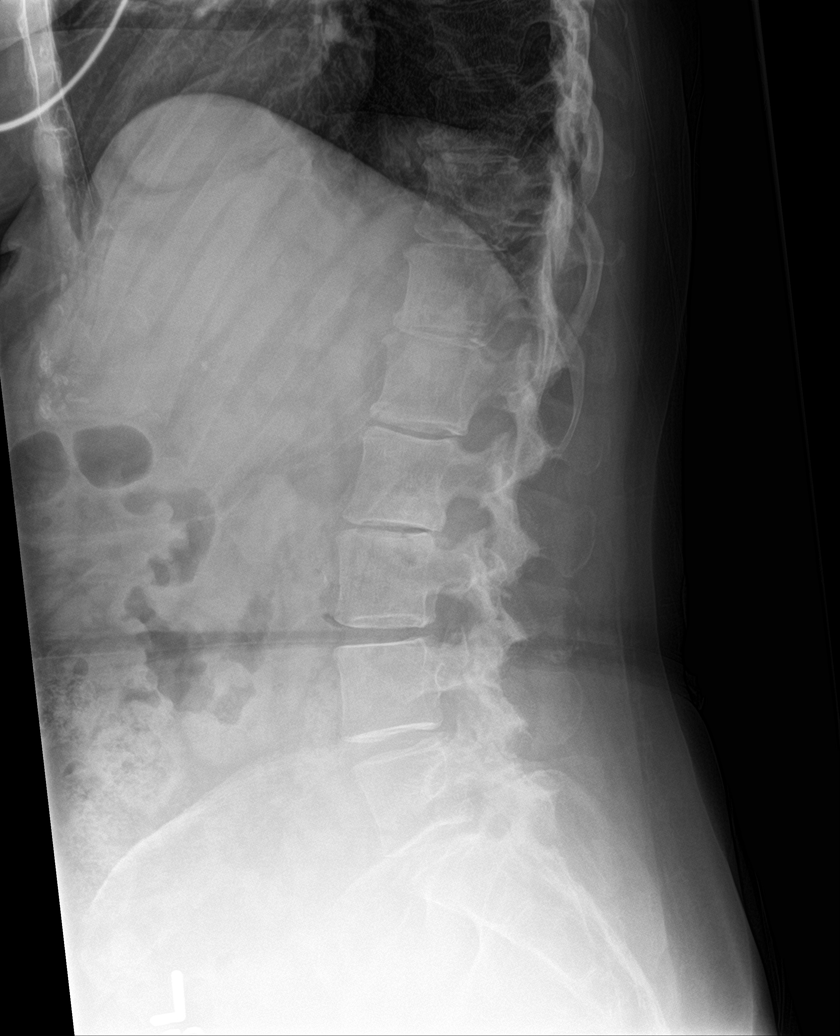

[l-spine spot]
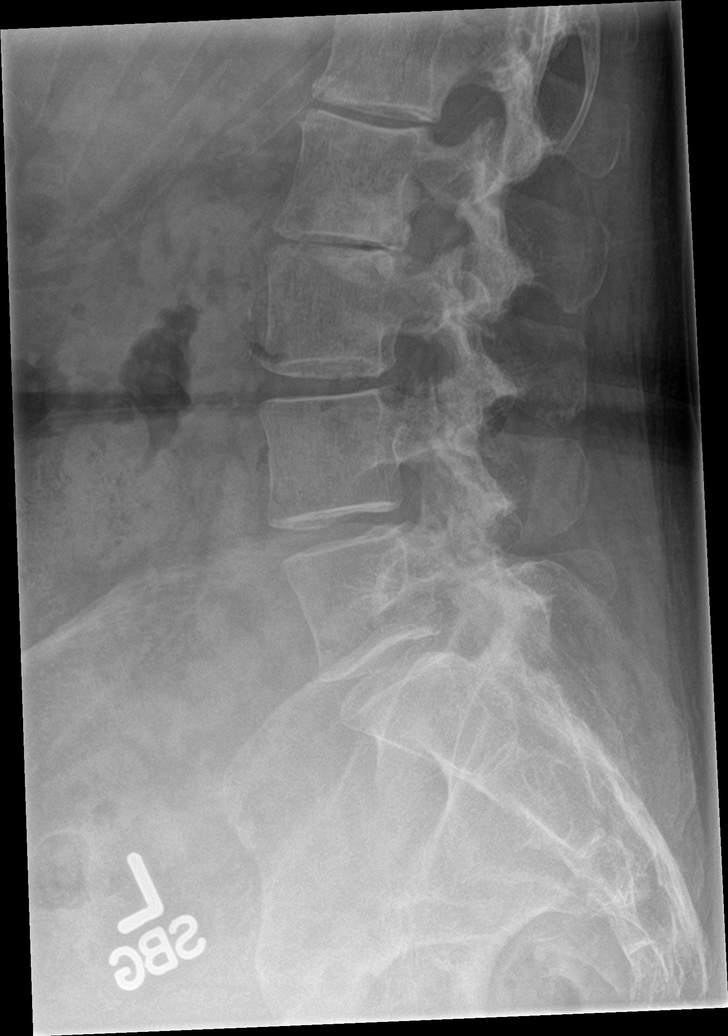

[l-spine obl (2 of 2)]
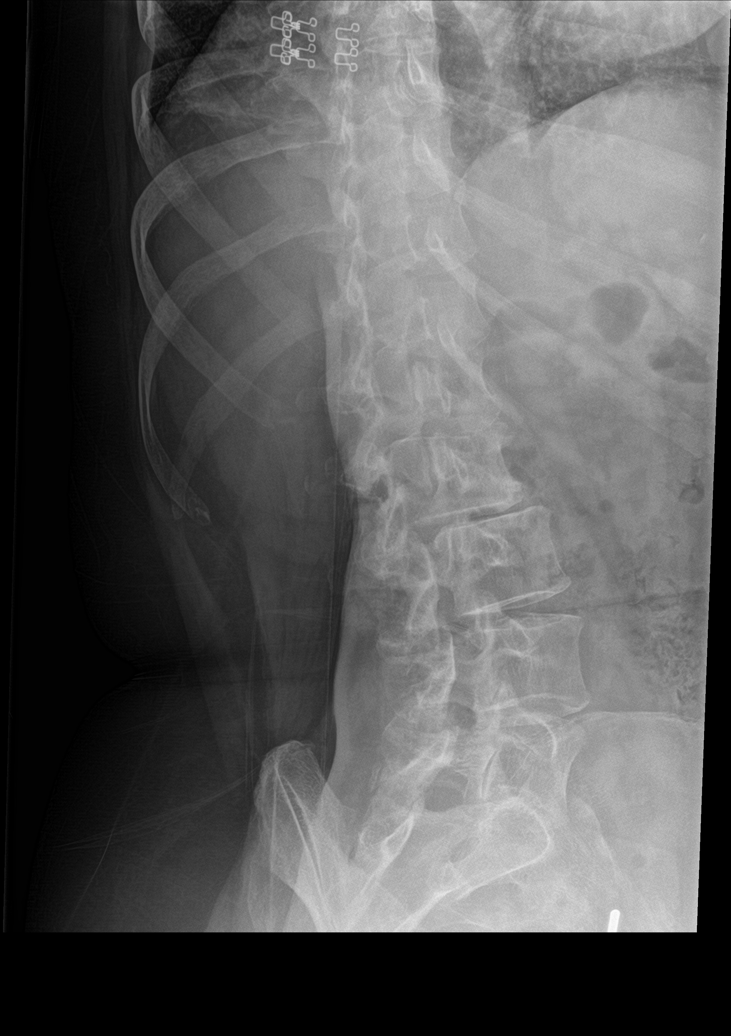

[5 of 5 positions shown; findings below may reference images not displayed]

FINDINGS: Scoliosis lumbar spine concave left. No acute bony abnormality
identified. No focal bony abnormality.
IMPRESSION: Scoliosis lumbar spine concave left. Diffuse degenerative change. No
acute bony abnormality identified.

## 2017-09-21 ENCOUNTER — Other Ambulatory Visit: Payer: Self-pay | Admitting: Internal Medicine

## 2017-11-05 ENCOUNTER — Telehealth: Payer: Self-pay | Admitting: Internal Medicine

## 2017-11-05 DIAGNOSIS — E039 Hypothyroidism, unspecified: Secondary | ICD-10-CM

## 2017-11-05 MED ORDER — LEVOTHYROXINE SODIUM 25 MCG PO TABS: 25.0000 ug | ORAL_TABLET | Freq: Every day | ORAL | 0 refills | Status: DC

## 2017-11-05 NOTE — Telephone Encounter (Addendum)
Levothyroxine 25 mcg  refill Last Refill:06/22/17 # 30 and 3 refills Last OV: 09/07/17 PCP: Cheryl Robbins Pharmacy: [facility]  [address]  Last TSH was 09/08/07 results WNL  Refilled for 90 days per pt request.

## 2017-11-05 NOTE — Telephone Encounter (Signed)
Copied from [place] [id number]. Topic: Quick Communication - Rx Refill/Question >> Nov 05, 2017 10:50 AM Reyne Dumas L wrote: Medication: levothyroxine (SYNTHROID, LEVOTHROID) 25 MCG tablet  Has the patient contacted their pharmacy? No - she is out of refills and would like to start to get this as a 90 day medication (Agent: If no, request that the patient contact the pharmacy for the refill.) (Agent: If yes, when and what did the pharmacy advise?)  Preferred Pharmacy (with phone number or street name): [facility] [place] - [place], [place] - [address] [phone] (Phone) [phone] (Fax)  Agent: Please be advised that RX refills may take up to 3 business days. We ask that you follow-up with your pharmacy.

## 2017-11-06 ENCOUNTER — Ambulatory Visit: Payer: Medicare Other

## 2017-11-16 ENCOUNTER — Ambulatory Visit (INDEPENDENT_AMBULATORY_CARE_PROVIDER_SITE_OTHER): Payer: Medicare Other | Admitting: *Deleted

## 2017-11-16 DIAGNOSIS — Z23 Encounter for immunization: Secondary | ICD-10-CM

## 2017-11-16 NOTE — Progress Notes (Signed)
Per orders of Dr. Regis Bill, injection of Influenza given by Westley Hummer. Patient tolerated injection well.

## 2017-12-23 ENCOUNTER — Other Ambulatory Visit: Payer: Self-pay | Admitting: Internal Medicine

## 2018-03-03 ENCOUNTER — Other Ambulatory Visit: Payer: Self-pay

## 2018-03-03 DIAGNOSIS — E039 Hypothyroidism, unspecified: Secondary | ICD-10-CM

## 2018-03-03 MED ORDER — LEVOTHYROXINE SODIUM 25 MCG PO TABS: 25.0000 ug | ORAL_TABLET | Freq: Every day | ORAL | 0 refills | Status: DC

## 2018-03-18 LAB — HM MAMMOGRAPHY

## 2018-04-06 ENCOUNTER — Encounter: Payer: Self-pay | Admitting: Internal Medicine

## 2018-05-06 ENCOUNTER — Encounter: Payer: Self-pay | Admitting: Internal Medicine

## 2018-05-11 ENCOUNTER — Other Ambulatory Visit: Payer: Self-pay | Admitting: Internal Medicine

## 2018-05-11 DIAGNOSIS — E039 Hypothyroidism, unspecified: Secondary | ICD-10-CM

## 2018-06-22 ENCOUNTER — Other Ambulatory Visit: Payer: Self-pay | Admitting: Internal Medicine

## 2018-08-30 ENCOUNTER — Other Ambulatory Visit: Payer: Self-pay | Admitting: Internal Medicine

## 2018-08-30 DIAGNOSIS — E039 Hypothyroidism, unspecified: Secondary | ICD-10-CM

## 2018-09-23 ENCOUNTER — Other Ambulatory Visit: Payer: Self-pay | Admitting: Internal Medicine

## 2018-11-20 ENCOUNTER — Other Ambulatory Visit: Payer: Self-pay

## 2018-11-20 ENCOUNTER — Ambulatory Visit (INDEPENDENT_AMBULATORY_CARE_PROVIDER_SITE_OTHER): Payer: Medicare Other

## 2018-11-20 DIAGNOSIS — Z23 Encounter for immunization: Secondary | ICD-10-CM

## 2018-12-01 ENCOUNTER — Other Ambulatory Visit: Payer: Self-pay | Admitting: Internal Medicine

## 2018-12-01 DIAGNOSIS — E039 Hypothyroidism, unspecified: Secondary | ICD-10-CM

## 2018-12-02 NOTE — Addendum Note (Signed)
Addended by: Jefferson Fuel on: 12/02/2018 12:46 PM   Modules accepted: Orders

## 2018-12-02 NOTE — Telephone Encounter (Signed)
Medication Refill - Medication: levothyroxine (SYNTHROID) 25 MCG tablet/Pt has scheduled appt for 12/22/18. Has enough medication for about a week. Requesting refill to cover through appt. Please advise.  Has the patient contacted their pharmacy? No. (Agent: If no, request that the patient contact the pharmacy for the refill.) (Agent: If yes, when and what did the pharmacy advise?)  Preferred Pharmacy (with phone number or street name):  [facility] - [place], [place] - [address] [phone] (Phone) [phone] (Fax)     Agent: Please be advised that RX refills may take up to 3 business days. We ask that you follow-up with your pharmacy.

## 2018-12-02 NOTE — Telephone Encounter (Signed)
Requested medication (s) are due for refill today: yes  Requested medication (s) are on the active medication list: yes  Last refill:  08/30/2018  Future visit scheduled: yes  Notes to clinic:  Pt has scheduled appt for 12/22/18. Has enough medication for about a week. Requesting refill to cover through appt. Please advise   Requested Prescriptions  Pending Prescriptions Disp Refills   levothyroxine (SYNTHROID) 25 MCG tablet 90 tablet 0    Sig: TAKE 1 TABLET BY MOUTH EVERY DAY BEFORE BREAKFAST     Endocrinology:  Hypothyroid Agents Failed - 12/02/2018 12:46 PM      Failed - TSH needs to be rechecked within 3 months after an abnormal result. Refill until TSH is due.      Failed - TSH in normal range and within 360 days    TSH  Date Value Ref Range Status  09/07/2017 1.24 0.35 - 4.50 uIU/mL Final         Failed - Valid encounter within last 12 months    Recent Outpatient Visits          1 year ago Visit for preventive health examination   [place] at [facility], Standley Brooking, MD   1 year ago Other acute sinusitis, recurrence not specified   [facility], Ishmael Holter, MD   1 year ago Essential hypertension   [facility], Standley Brooking, MD   1 year ago Other acute sinusitis, recurrence not specified   [facility] at [facility], Ishmael Holter, MD   2 years ago Trochanteric bursitis of both hips   [place], [place], DO      Future Appointments            In 2 weeks Panosh, Standley Brooking, MD [place] [facility] [place], [facility] Refills   levothyroxine (SYNTHROID) 25 MCG tablet [Pharmacy Med Name: levothyroxine 25 mcg tablet] 90 tablet 0    Sig: TAKE 1 TABLET BY [place]     Endocrinology:  Hypothyroid Agents Failed - 12/02/2018 12:46 PM      Failed - TSH needs to be rechecked within 3 months after an abnormal result.  Refill until TSH is due.      Failed - TSH in normal range and within 360 days    TSH  Date Value Ref Range Status  09/07/2017 1.24 0.35 - 4.50 uIU/mL Final         Failed - Valid encounter within last 12 months    Recent Outpatient Visits          1 year ago Visit for preventive health examination   [place] at [facility], Standley Brooking, MD   1 year ago Other acute sinusitis, recurrence not specified   [facility] at [facility], Ishmael Holter, MD   1 year ago Essential hypertension   [facility] at [facility], Standley Brooking, MD   1 year ago Other acute sinusitis, recurrence not specified   [facility] at [facility], Ishmael Holter, MD   2 years ago Trochanteric bursitis of both hips   [facility], [place], DO      Future Appointments            In 2 weeks Panosh, Standley Brooking, MD [place] at [place], [facility]

## 2018-12-06 MED ORDER — LEVOTHYROXINE SODIUM 25 MCG PO TABS: ORAL_TABLET | ORAL | 0 refills | Status: DC

## 2018-12-21 NOTE — Progress Notes (Signed)
Chief Complaint  Patient presents with  . Follow-up    Pt wants to talk about calcium and vitamin b12   . Hypertension  . Hypothyroidism  . Hyperlipidemia    HPI: Cheryl Robbins 73 y.o. come in for Chronic disease management  Doing well but has med evaluation     cpx got delayed from April cause of covid   BP :  Has log .  On valsartan hctz   Thyroid : Every day    No concerns   HLD:   Every day .  meds no se   Allergies    A stick test   About the same  Dust mite mold  And cates  Seasonal     Peanuts.   But can eat peanut butter and  Such.   Asking about calcium  And stopped    Should she take. ?  Upsets stomach   And  Trying b complex and caused gi se.   Even with food.  Not sure if she should take this   [place] of 1 helps take care of grand child no tobacco  No Falling  Memory is good    ROS: See pertinent positives and negatives per HPI. No cp sob change bowel habit  Has vein area non leg  No change gi gu sx   Past Medical History:  Diagnosis Date  . Allergy   . GERD (gastroesophageal reflux disease)   . Headache(784.0)   . Hyperlipidemia   . Hypertension   . Low back pain   . Osteopenia   . 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 Primiparous   . Rosacea     Family History  Problem Relation Age of Onset  . Atrial fibrillation Mother        had cv in her 73 s  . Arthritis Mother        had staph infection knee replacment  . Cancer Sister        thryoid  . Fibromyalgia Brother   . Colon cancer Neg Hx   . Rectal cancer Neg Hx   . Stomach cancer Neg Hx     Social History   Socioeconomic History  . Marital status: Married    Spouse name: Not on file  . Number of children: Not on file  . Years of education: Not on file  . Highest education level: Not on file  Occupational History  . Not on file  Social Needs  . Financial resource strain: Not on file  . Food insecurity    Worry: Not on file    Inability:  Not on file  . Transportation needs    Medical: Not on file    Non-medical: Not on file  Tobacco Use  . Smoking status: Never Smoker  . Smokeless tobacco: Never Used  Substance and Sexual Activity  . Alcohol use: No  . Drug use: No  . Sexual activity: Not on file  Lifestyle  . Physical activity    Days per week: Not on file    Minutes per session: Not on file  . Stress: Not on file  Relationships  . Social Herbalist on phone: Not on file    Gets together: Not on file    Attends religious service: Not on file    Active member of club or organization: Not on file    Attends meetings of clubs or organizations:  Not on file    Relationship status: Not on file  Other Topics Concern  . Not on file  Social History Narrative   hh of 1   No pets   No ets.   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 acetaminophen (TYLENOL) 500 MG tablet Take 1,000 mg by mouth every 6 (six) hours as needed.    . beta carotene w/minerals (OCUVITE) tablet Take 1 tablet by mouth daily.    Marland Kitchen co-enzyme Q-10 50 MG capsule Take 150 mg by mouth daily.     . diphenhydrAMINE (BENADRYL) 25 MG tablet Take 25 mg by mouth every 6 (six) hours as needed.    . fluocinonide-emollient (LIDEX-E) 0.05 % cream Apply 1 application topically 2 (two) times daily. 30 g 0  . fluticasone (FLONASE) 50 MCG/ACT nasal spray Place into both nostrils daily.    Marland Kitchen guaiFENesin (MUCINEX) 600 MG 12 hr tablet Take by mouth 2 (two) times daily.    Marland Kitchen ibuprofen (ADVIL,MOTRIN) 200 MG tablet Take 200 mg by mouth every 6 (six) hours as needed.    . loratadine (CLARITIN) 10 MG tablet Take 10 mg by mouth daily.    Marland Kitchen omeprazole (PRILOSEC) 20 MG capsule Take 20 mg by mouth daily.    . ondansetron (ZOFRAN-ODT) 4 MG  disintegrating tablet Take 1 tablet (4 mg total) by mouth every 8 (eight) hours as needed for nausea or vomiting (migraine). 15 tablet 1  . telmisartan-hydrochlorothiazide (MICARDIS HCT) 40-12.5 MG tablet TAKE 1 TABLET BY MOUTH EVERY DAY 90 tablet 1  . Turmeric 500 MG TABS Take by mouth.    Marland Kitchen atorvastatin (LIPITOR) 10 MG tablet TAKE 1 TABLET BY MOUTH EVERY DAY 90 tablet 0  . levothyroxine (SYNTHROID) 25 MCG tablet TAKE 1 TABLET BY MOUTH EVERY DAY BEFORE BREAKFAST 30 tablet 0  . valsartan-hydrochlorothiazide (DIOVAN-HCT) 160-12.5 MG tablet Take 1 tablet by mouth daily. 90 tablet 3  . Calcium Carbonate-Vitamin D (CALTRATE 600+D) 600-400 MG-UNIT per tablet Take 1 tablet by mouth daily.    . promethazine (PHENERGAN) 12.5 MG suppository Place 1 suppository (12.5 mg total) rectally every 4 (four) hours as needed. 4 suppository 2  . telmisartan-hydrochlorothiazide (MICARDIS HCT) 40-12.5 MG tablet Take 1 tablet by mouth daily. 90 tablet 0   No facility-administered medications prior to visit.      EXAM:  BP 138/74 (BP Location: Right Arm, Patient Position: Sitting, Cuff Size: Normal)   Pulse 82   Temp 97.6 F (36.4 C) (Temporal)   Wt 162 lb 3.2 oz (73.6 kg)   SpO2 98%   BMI 27.84 kg/m   Body mass index is 27.84 kg/m.  GENERAL: vitals reviewed and listed above, alert, oriented, appears well hydrated and in no acute distress HEENT: atraumatic, conjunctiva  clear, no obvious abnormalities on inspection of external nose and ears OP :masked  NECK: no obvious masses on inspection palpation  LUNGS: clear to auscultation bilaterally, no wheezes, rales or rhonchi, good air movement CV: HRRR, no clubbing cyanosis or  peripheral edema nl cap refill  Abdomen:  Sof,t normal bowel sounds without hepatosplenomegaly, no guarding rebound or masses no CVA tenderness MS: moves all extremities without noticeable focal  Abnormality  Few spider vein left  thigh inner  PSYCH: pleasant and cooperative, no obvious  depression or anxiety Lab Results  Component Value Date   WBC 3.8 (L) 09/07/2017   HGB 14.6 09/07/2017   HCT 42.8 09/07/2017   PLT 169.0 09/07/2017   GLUCOSE 107 (H) 09/07/2017   CHOL 126 09/07/2017   TRIG 114.0 09/07/2017   HDL 36.10 (L) 09/07/2017   LDLDIRECT 171.0 06/17/2006   LDLCALC 67 09/07/2017   ALT 17 09/07/2017   AST 10 09/07/2017   NA 135 09/07/2017   K 4.3 09/07/2017   CL 98 09/07/2017   CREATININE 0.84 09/07/2017   BUN 11 09/07/2017   CO2 31 09/07/2017   TSH 1.24 09/07/2017   HGBA1C 5.3 10/16/2014   BP Readings from Last 3 Encounters:  12/22/18 138/74  09/07/17 120/74  04/27/17 (!) 150/80   At roast beef sandwhich   Tea.  Will make fasting  appt  ASSESSMENT AND PLAN:  Discussed the following assessment and plan:  Essential hypertension - Plan: Basic metabolic panel, CBC with Differential, Hepatic function panel, Lipid panel, TSH  Medication management - Plan: Basic metabolic panel, CBC with Differential, Hepatic function panel, Lipid panel, TSH  Hx of migraine headaches  POLIOMYELITIS, HX OF  Hyperlipidemia, unspecified hyperlipidemia type - Plan: Basic metabolic panel, CBC with Differential, Hepatic function panel, Lipid panel, TSH  Hypothyroidism, unspecified type - Plan: levothyroxine (SYNTHROID) 25 MCG tablet, Basic metabolic panel, CBC with Differential, Hepatic function panel, Lipid panel, TSH, DG Bone Density [place]  Estrogen deficiency - Plan: DG Bone Density [place] Refill meds and lab monitoring assuming ok   Disc calcium vit d b12 and check into food complements  -Patient advised to return or notify health care team  if  new concerns arise.  Patient Instructions  Glad you are doing well.  Refilled your  medication  Get appt for fasting labs   And appt for bone density ( dexa) when convenient .   Can check foods with calcium vit d and b12   Instead of supplements  At this time      [place] protect organs, store calcium, anchor  muscles, and support the whole body. Keeping your bones strong is important, especially as you get older. You can take actions to help keep your bones strong and healthy. Why is keeping my bones healthy important?  Keeping your bones healthy is important because your body constantly replaces bone cells. Cells get old, and new cells take their place. As we age, we lose bone cells because the body may not be able to make enough new cells to replace the old cells. The amount of bone cells and bone tissue you have is referred to as bone mass. The higher your bone mass, the stronger your bones. The aging process leads to an overall loss of bone mass in the body, which can increase the likelihood of:  Joint pain and stiffness.  Broken bones.  A condition in which the bones become weak and brittle (osteoporosis). A large decline in bone mass occurs in older adults. In women, it occurs about the time of menopause. What actions can I take to keep my bones healthy? Good health habits are important for maintaining healthy bones. This includes eating nutritious foods and exercising regularly. To have healthy bones, you need to get enough of the right minerals and vitamins. Most nutrition experts recommend getting these nutrients from the foods that you eat. In some cases, taking supplements may also be recommended. Doing certain types of  exercise is also important for bone health. What are the nutritional recommendations for healthy bones?  Eating a well-balanced diet with plenty of calcium and vitamin D will help to protect your bones. Nutritional recommendations vary from person to person. Ask your health care provider what is healthy for you. Here are some general guidelines. Get enough calcium Calcium is the most important (essential) mineral for bone health. Most people can get enough calcium from their diet, but supplements may be recommended for people who are at risk for osteoporosis. Good sources of  calcium include:  Dairy products, such as low-fat or nonfat milk, cheese, and yogurt.  Dark green leafy vegetables, such as bok choy and broccoli.  Calcium-fortified foods, such as orange juice, cereal, bread, soy beverages, and tofu products.  Nuts, such as almonds. Follow these recommended amounts for daily calcium intake:  Children, age 313-3: 700 mg.  Children, age 31-8: 1,000 mg.  Children, age 82-13: 1,300 mg.  Teens, age 77-18: 1,300 mg.  Adults, age 97-50: 1,000 mg.  Adults, age 62-70: ? Men: 1,000 mg. ? Women: 1,200 mg.  Adults, age 67 or older: 1,200 mg.  Pregnant and breastfeeding females: ? Teens: 1,300 mg. ? Adults: 1,000 mg. Get enough vitamin D Vitamin D is the most essential vitamin for bone health. It helps the body absorb calcium. Sunlight stimulates the skin to make vitamin D, so be sure to get enough sunlight. If you live in a cold climate or you do not get outside often, your health care provider may recommend that you take vitamin D supplements. Good sources of vitamin D in your diet include:  Egg yolks.  Saltwater fish.  Milk and cereal fortified with vitamin D. Follow these recommended amounts for daily vitamin D intake:  Children and teens, age 313-18: 600 international units.  Adults, age 74 or younger: 400-800 international units.  Adults, age 69 or older: 800-1,000 international units. Get other important nutrients Other nutrients that are important for bone health include:  Phosphorus. This mineral is found in meat, poultry, dairy foods, nuts, and legumes. The recommended daily intake for adult men and adult women is 700 mg.  Magnesium. This mineral is found in seeds, nuts, dark green vegetables, and legumes. The recommended daily intake for adult men is 400-420 mg. For adult women, it is 310-320 mg.  Vitamin K. This vitamin is found in green leafy vegetables. The recommended daily intake is 120 mg for adult men and 90 mg for adult women.  What type of physical activity is best for building and maintaining healthy bones? Weight-bearing and strength-building activities are important for building and maintaining healthy bones. Weight-bearing activities cause muscles and bones to work against gravity. Strength-building activities increase the strength of the muscles that support bones. Weight-bearing and muscle-building activities include:  Walking and hiking.  Jogging and running.  Dancing.  Gym exercises.  Lifting weights.  Tennis and racquetball.  Climbing stairs.  Aerobics. Adults should get at least 30 minutes of moderate physical activity on most days. Children should get at least 60 minutes of moderate physical activity on most days. Ask your health care provider what type of exercise is best for you. How can I find out if my bone mass is low? Bone mass can be measured with an X-ray test called a bone mineral density (BMD) test. This test is recommended for all women who are age 85 or older. It may also be recommended for:  Men who are age 63 or older.  People who are at risk for osteoporosis because of: ? Having bones that break easily. ? Having a long-term disease that weakens bones, such as kidney disease or rheumatoid arthritis. ? Having menopause earlier than normal. ? Taking medicine that weakens bones, such as steroids, thyroid hormones, or hormone treatment for breast cancer or prostate cancer. ? Smoking. ? Drinking three or more alcoholic drinks a day. If you find that you have a low bone mass, you may be able to prevent osteoporosis or further bone loss by changing your diet and lifestyle. Where can I find more information? For more information, check out the following websites:  [place]: AviationTales.fr  [employer] of Health: www.bones.SouthExposed.es  International Osteoporosis Foundation: Administrator.iofbonehealth.org Summary  The aging process leads to an overall loss  of bone mass in the body, which can increase the likelihood of broken bones and osteoporosis.  Eating a well-balanced diet with plenty of calcium and vitamin D will help to protect your bones.  Weight-bearing and strength-building activities are also important for building and maintaining strong bones.  Bone mass can be measured with an X-ray test called a bone mineral density (BMD) test. This information is not intended to replace advice given to you by your health care provider. Make sure you discuss any questions you have with your health care provider. Document Released: 04/26/2003 Document Revised: 03/02/2017 Document Reviewed: 03/02/2017 Elsevier Patient Education  2020 [place]  M.D.

## 2018-12-22 ENCOUNTER — Encounter: Payer: Self-pay | Admitting: Internal Medicine

## 2018-12-22 ENCOUNTER — Other Ambulatory Visit: Payer: Self-pay

## 2018-12-22 ENCOUNTER — Ambulatory Visit: Payer: Medicare Other | Admitting: Internal Medicine

## 2018-12-22 VITALS — BP 138/74 | HR 82 | Temp 97.6°F | Wt 162.2 lb

## 2018-12-22 DIAGNOSIS — Z79899 Other long term (current) drug therapy: Secondary | ICD-10-CM

## 2018-12-22 DIAGNOSIS — E785 Hyperlipidemia, unspecified: Secondary | ICD-10-CM

## 2018-12-22 DIAGNOSIS — E2839 Other primary ovarian failure: Secondary | ICD-10-CM

## 2018-12-22 DIAGNOSIS — Z8669 Personal history of other diseases of the nervous system and sense organs: Secondary | ICD-10-CM | POA: Diagnosis not present

## 2018-12-22 DIAGNOSIS — Z8612 Personal history of poliomyelitis: Secondary | ICD-10-CM | POA: Diagnosis not present

## 2018-12-22 DIAGNOSIS — I10 Essential (primary) hypertension: Secondary | ICD-10-CM | POA: Diagnosis not present

## 2018-12-22 DIAGNOSIS — E039 Hypothyroidism, unspecified: Secondary | ICD-10-CM

## 2018-12-22 MED ORDER — ATORVASTATIN CALCIUM 10 MG PO TABS: 10.0000 mg | ORAL_TABLET | Freq: Every day | ORAL | 3 refills | Status: DC

## 2018-12-22 MED ORDER — VALSARTAN-HYDROCHLOROTHIAZIDE 160-12.5 MG PO TABS: 1.0000 | ORAL_TABLET | Freq: Every day | ORAL | 3 refills | Status: DC

## 2018-12-22 MED ORDER — LEVOTHYROXINE SODIUM 25 MCG PO TABS: ORAL_TABLET | ORAL | 3 refills | Status: DC

## 2018-12-22 NOTE — Patient Instructions (Addendum)
Glad you are doing well.  Refilled your  medication  Get appt for fasting labs   And appt for bone density ( dexa) when convenient .   Can check foods with calcium vit d and b12   Instead of supplements  At this time      [place] protect organs, store calcium, anchor muscles, and support the whole body. Keeping your bones strong is important, especially as you get older. You can take actions to help keep your bones strong and healthy. Why is keeping my bones healthy important?  Keeping your bones healthy is important because your body constantly replaces bone cells. Cells get old, and new cells take their place. As we age, we lose bone cells because the body may not be able to make enough new cells to replace the old cells. The amount of bone cells and bone tissue you have is referred to as bone mass. The higher your bone mass, the stronger your bones. The aging process leads to an overall loss of bone mass in the body, which can increase the likelihood of:  Joint pain and stiffness.  Broken bones.  A condition in which the bones become weak and brittle (osteoporosis). A large decline in bone mass occurs in older adults. In women, it occurs about the time of menopause. What actions can I take to keep my bones healthy? Good health habits are important for maintaining healthy bones. This includes eating nutritious foods and exercising regularly. To have healthy bones, you need to get enough of the right minerals and vitamins. Most nutrition experts recommend getting these nutrients from the foods that you eat. In some cases, taking supplements may also be recommended. Doing certain types of exercise is also important for bone health. What are the nutritional recommendations for healthy bones?  Eating a well-balanced diet with plenty of calcium and vitamin D will help to protect your bones. Nutritional recommendations vary from person to person. Ask your health care provider what  is healthy for you. Here are some general guidelines. Get enough calcium Calcium is the most important (essential) mineral for bone health. Most people can get enough calcium from their diet, but supplements may be recommended for people who are at risk for osteoporosis. Good sources of calcium include:  Dairy products, such as low-fat or nonfat milk, cheese, and yogurt.  Dark green leafy vegetables, such as bok choy and broccoli.  Calcium-fortified foods, such as orange juice, cereal, bread, soy beverages, and tofu products.  Nuts, such as almonds. Follow these recommended amounts for daily calcium intake:  Children, age 13-3: 700 mg.  Children, age 31-8: 1,000 mg.  Children, age 82-13: 1,300 mg.  Teens, age 25-18: 1,300 mg.  Adults, age 56-50: 1,000 mg.  Adults, age 54-70: ? Men: 1,000 mg. ? Women: 1,200 mg.  Adults, age 67 or older: 1,200 mg.  Pregnant and breastfeeding females: ? Teens: 1,300 mg. ? Adults: 1,000 mg. Get enough vitamin D Vitamin D is the most essential vitamin for bone health. It helps the body absorb calcium. Sunlight stimulates the skin to make vitamin D, so be sure to get enough sunlight. If you live in a cold climate or you do not get outside often, your health care provider may recommend that you take vitamin D supplements. Good sources of vitamin D in your diet include:  Egg yolks.  Saltwater fish.  Milk and cereal fortified with vitamin D. Follow these recommended amounts for daily vitamin D intake:  Children  and teens, age 66-18: 33 international units.  Adults, age 6 or younger: 400-800 international units.  Adults, age 49 or older: 800-1,000 international units. Get other important nutrients Other nutrients that are important for bone health include:  Phosphorus. This mineral is found in meat, poultry, dairy foods, nuts, and legumes. The recommended daily intake for adult men and adult women is 700 mg.  Magnesium. This mineral is found  in seeds, nuts, dark green vegetables, and legumes. The recommended daily intake for adult men is 400-420 mg. For adult women, it is 310-320 mg.  Vitamin K. This vitamin is found in green leafy vegetables. The recommended daily intake is 120 mg for adult men and 90 mg for adult women. What type of physical activity is best for building and maintaining healthy bones? Weight-bearing and strength-building activities are important for building and maintaining healthy bones. Weight-bearing activities cause muscles and bones to work against gravity. Strength-building activities increase the strength of the muscles that support bones. Weight-bearing and muscle-building activities include:  Walking and hiking.  Jogging and running.  Dancing.  Gym exercises.  Lifting weights.  Tennis and racquetball.  Climbing stairs.  Aerobics. Adults should get at least 30 minutes of moderate physical activity on most days. Children should get at least 60 minutes of moderate physical activity on most days. Ask your health care provider what type of exercise is best for you. How can I find out if my bone mass is low? Bone mass can be measured with an X-ray test called a bone mineral density (BMD) test. This test is recommended for all women who are age 42 or older. It may also be recommended for:  Men who are age 37 or older.  People who are at risk for osteoporosis because of: ? Having bones that break easily. ? Having a long-term disease that weakens bones, such as kidney disease or rheumatoid arthritis. ? Having menopause earlier than normal. ? Taking medicine that weakens bones, such as steroids, thyroid hormones, or hormone treatment for breast cancer or prostate cancer. ? Smoking. ? Drinking three or more alcoholic drinks a day. If you find that you have a low bone mass, you may be able to prevent osteoporosis or further bone loss by changing your diet and lifestyle. Where can I find more  information? For more information, check out the following websites:  [place]: AviationTales.fr  [employer] of Health: www.bones.SouthExposed.es  International Osteoporosis Foundation: Administrator.iofbonehealth.org Summary  The aging process leads to an overall loss of bone mass in the body, which can increase the likelihood of broken bones and osteoporosis.  Eating a well-balanced diet with plenty of calcium and vitamin D will help to protect your bones.  Weight-bearing and strength-building activities are also important for building and maintaining strong bones.  Bone mass can be measured with an X-ray test called a bone mineral density (BMD) test. This information is not intended to replace advice given to you by your health care provider. Make sure you discuss any questions you have with your health care provider. Document Released: 04/26/2003 Document Revised: 03/02/2017 Document Reviewed: 03/02/2017 Elsevier Patient Education  2020 [employer].

## 2018-12-29 ENCOUNTER — Other Ambulatory Visit (INDEPENDENT_AMBULATORY_CARE_PROVIDER_SITE_OTHER): Payer: Medicare Other

## 2018-12-29 ENCOUNTER — Other Ambulatory Visit: Payer: Self-pay

## 2018-12-29 DIAGNOSIS — E039 Hypothyroidism, unspecified: Secondary | ICD-10-CM | POA: Diagnosis not present

## 2018-12-29 DIAGNOSIS — E785 Hyperlipidemia, unspecified: Secondary | ICD-10-CM

## 2018-12-29 DIAGNOSIS — Z79899 Other long term (current) drug therapy: Secondary | ICD-10-CM | POA: Diagnosis not present

## 2018-12-29 DIAGNOSIS — I10 Essential (primary) hypertension: Secondary | ICD-10-CM

## 2018-12-29 LAB — LIPID PANEL
Cholesterol: 153 mg/dL (ref 0–200)
HDL: 42.8 mg/dL (ref >39.00)
LDL Cholesterol: 89 mg/dL (ref 0–99)
NonHDL: 110.23
Total CHOL/HDL Ratio: 4
Triglycerides: 104 mg/dL (ref 0.0–149.0)
VLDL: 20.8 mg/dL (ref 0.0–40.0)

## 2018-12-29 LAB — HEPATIC FUNCTION PANEL
ALT: 18 U/L (ref 0–35)
AST: 12 U/L (ref 0–37)
Albumin: 4.4 g/dL (ref 3.5–5.2)
Alkaline Phosphatase: 58 U/L (ref 39–117)
Bilirubin, Direct: 0.1 mg/dL (ref 0.0–0.3)
Total Bilirubin: 0.6 mg/dL (ref 0.2–1.2)
Total Protein: 6.3 g/dL (ref 6.0–8.3)

## 2018-12-29 LAB — CBC WITH DIFFERENTIAL/PLATELET
Basophils Absolute: 0 10*3/uL (ref 0.0–0.1)
Basophils Relative: 0.4 % (ref 0.0–3.0)
Eosinophils Absolute: 0.2 10*3/uL (ref 0.0–0.7)
Eosinophils Relative: 3.1 % (ref 0.0–5.0)
HCT: 42.9 % (ref 36.0–46.0)
Hemoglobin: 14.3 g/dL (ref 12.0–15.0)
Lymphocytes Relative: 30.1 % (ref 12.0–46.0)
Lymphs Abs: 1.5 10*3/uL (ref 0.7–4.0)
MCHC: 33.4 g/dL (ref 30.0–36.0)
MCV: 86.7 fl (ref 78.0–100.0)
Monocytes Absolute: 0.3 10*3/uL (ref 0.1–1.0)
Monocytes Relative: 7 % (ref 3.0–12.0)
Neutro Abs: 2.9 10*3/uL (ref 1.4–7.7)
Neutrophils Relative %: 59.4 % (ref 43.0–77.0)
Platelets: 179 10*3/uL (ref 150.0–400.0)
RBC: 4.95 Mil/uL (ref 3.87–5.11)
RDW: 13 % (ref 11.5–15.5)
WBC: 4.9 10*3/uL (ref 4.0–10.5)

## 2018-12-29 LAB — BASIC METABOLIC PANEL
BUN: 16 mg/dL (ref 6–23)
CO2: 32 mEq/L (ref 19–32)
Calcium: 9.3 mg/dL (ref 8.4–10.5)
Chloride: 98 mEq/L (ref 96–112)
Creatinine, Ser: 0.89 mg/dL (ref 0.40–1.20)
GFR: 61.95 mL/min (ref >60.00)
Glucose, Bld: 107 mg/dL — ABNORMAL HIGH (ref 70–99)
Potassium: 4.5 mEq/L (ref 3.5–5.1)
Sodium: 137 mEq/L (ref 135–145)

## 2018-12-29 LAB — TSH: TSH: 2.8 u[IU]/mL (ref 0.35–4.50)

## 2019-01-03 ENCOUNTER — Other Ambulatory Visit: Payer: Medicare Other

## 2019-03-19 ENCOUNTER — Ambulatory Visit: Payer: Medicare Other

## 2019-03-22 ENCOUNTER — Ambulatory Visit: Payer: Medicare Other | Attending: Internal Medicine

## 2019-03-22 DIAGNOSIS — Z20822 Contact with and (suspected) exposure to covid-19: Secondary | ICD-10-CM

## 2019-03-23 LAB — NOVEL CORONAVIRUS, NAA: SARS-CoV-2, NAA: NOT DETECTED

## 2019-03-27 ENCOUNTER — Ambulatory Visit: Payer: Medicare Other | Attending: Internal Medicine

## 2019-03-27 DIAGNOSIS — Z23 Encounter for immunization: Secondary | ICD-10-CM | POA: Insufficient documentation

## 2019-03-27 NOTE — Progress Notes (Signed)
   Covid-19 Vaccination Clinic  Name:  Cheryl Robbins    MRN: [id number] DOB: July 28, 1945  03/27/2019  [name] was observed post Covid-19 immunization for 15 minutes without incidence. She was provided with Vaccine Information Sheet and instruction to access the V-Safe system.   [name] was instructed to call 911 with any severe reactions post vaccine: Marland Kitchen Difficulty breathing  . Swelling of your face and throat  . A fast heartbeat  . A bad rash all over your body  . Dizziness and weakness    Immunizations Administered    Name Date Dose VIS Date Route   Pfizer COVID-19 Vaccine 03/27/2019  8:50 AM 0.3 mL 01/28/2019 Intramuscular   Manufacturer: [place]   Lot: [id number]   [place]: [id number]

## 2019-03-30 ENCOUNTER — Ambulatory Visit: Payer: Medicare Other

## 2019-03-31 LAB — HM MAMMOGRAPHY

## 2019-04-18 ENCOUNTER — Ambulatory Visit: Payer: Medicare Other | Attending: Internal Medicine

## 2019-04-18 DIAGNOSIS — Z20822 Contact with and (suspected) exposure to covid-19: Secondary | ICD-10-CM

## 2019-04-19 LAB — NOVEL CORONAVIRUS, NAA: SARS-CoV-2, NAA: NOT DETECTED

## 2019-04-20 ENCOUNTER — Ambulatory Visit: Payer: Medicare Other | Attending: Internal Medicine

## 2019-04-20 DIAGNOSIS — Z23 Encounter for immunization: Secondary | ICD-10-CM | POA: Insufficient documentation

## 2019-04-20 NOTE — Progress Notes (Signed)
   Covid-19 Vaccination Clinic  Name:  CHARSIE STEIMEL    MRN: [id number] DOB: 02-Aug-1945  04/20/2019  Ms. Reiland was observed post Covid-19 immunization for 15 minutes without incident. She was provided with Vaccine Information Sheet and instruction to access the V-Safe system.   Ms. Vanderkolk was instructed to call 911 with any severe reactions post vaccine: Marland Kitchen Difficulty breathing  . Swelling of face and throat  . A fast heartbeat  . A bad rash all over body  . Dizziness and weakness   Immunizations Administered    Name Date Dose VIS Date Route   Pfizer COVID-19 Vaccine 04/20/2019  4:00 PM 0.3 mL 01/28/2019 Intramuscular   Manufacturer: [place]   Lot: [id number]   [place]: [id number]

## 2019-04-22 ENCOUNTER — Encounter: Payer: Self-pay | Admitting: Internal Medicine

## 2019-07-02 ENCOUNTER — Other Ambulatory Visit: Payer: Self-pay | Admitting: Internal Medicine

## 2019-08-01 ENCOUNTER — Ambulatory Visit: Payer: Medicare Other | Attending: Internal Medicine

## 2019-08-01 DIAGNOSIS — Z20822 Contact with and (suspected) exposure to covid-19: Secondary | ICD-10-CM

## 2019-08-02 ENCOUNTER — Telehealth (INDEPENDENT_AMBULATORY_CARE_PROVIDER_SITE_OTHER): Payer: Medicare Other | Admitting: Internal Medicine

## 2019-08-02 ENCOUNTER — Other Ambulatory Visit: Payer: Self-pay

## 2019-08-02 ENCOUNTER — Encounter: Payer: Self-pay | Admitting: Internal Medicine

## 2019-08-02 VITALS — BP 125/74 | HR 77 | Temp 98.6°F | Ht 65.0 in | Wt 153.0 lb

## 2019-08-02 DIAGNOSIS — J069 Acute upper respiratory infection, unspecified: Secondary | ICD-10-CM | POA: Diagnosis not present

## 2019-08-02 DIAGNOSIS — Z8709 Personal history of other diseases of the respiratory system: Secondary | ICD-10-CM

## 2019-08-02 LAB — NOVEL CORONAVIRUS, NAA: SARS-CoV-2, NAA: NOT DETECTED

## 2019-08-02 LAB — SARS-COV-2, NAA 2 DAY TAT

## 2019-08-02 MED ORDER — CEFDINIR 300 MG PO CAPS: 300.0000 mg | ORAL_CAPSULE | Freq: Two times a day (BID) | ORAL | 0 refills | Status: DC

## 2019-08-02 NOTE — Progress Notes (Signed)
Virtual Visit via Video Note  I connected with@ on 08/02/19 at  1:30 PM EDT by a video enabled telemedicine application and verified that I am speaking with the correct person using two identifiers. Location patient: home Location provider:work  office Persons participating in the virtual visit: patient, provider  [place]Ith national recommendations  regarding COVID 19 pandemic   video visit is advised over in office visit for this patient.  Patient aware  of the limitations of evaluation and management by telemedicine and  availability of in person appointments. and agreed to proceed.   HPI: Cheryl Robbins presents for video visit  Ears hurt sometimes  Since 3-4 days.  Of sx .   Not exactly like a sinus infection.    Weight for a head cold.  A bit better  Than yesterday .    Saddle bags    Face dull sinus headaches.    Puffy around nasal bridge ( saddle bags)   Had covid test yesterday cause going to travel to see Grandchildren [place] , in 1 week  Flying  plane to [place] . Want to avoid   Sx in flight ( hx of same)  No fever   Over 100  Mild.up one degree from baseline    Minor cough  Mucinex.   Had covid test results pending .   No known exposures   Pfizer vaccine  Past hx of sinus infection  But this dc is clear  And not local pain at this time   Uses Flonase when in yard  Allergy exposures   ROS: See pertinent positives and negatives per HPI. Minor cough no ccp sob  Ears hurt at times  No chills  Past Medical History:  Diagnosis Date  . Allergy   . GERD (gastroesophageal reflux disease)   . Headache(784.0)   . Hyperlipidemia   . Hypertension   . Low back pain   . Osteopenia   . 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 Primiparous   . Rosacea     Past Surgical History:  Procedure Laterality Date  . atypical moles    . 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 CATARACT  EXTRACTION     Both eyes  . COLONOSCOPY    . OOPHORECTOMY    . tonsilletomy  1953    Family History  Problem Relation Age of Onset  . Atrial fibrillation Mother        had cv in her 63 s  . Arthritis Mother        had staph infection knee replacment  . Cancer Sister        thryoid  . Fibromyalgia Brother   . Colon cancer Neg Hx   . Rectal cancer Neg Hx   . Stomach cancer Neg Hx     Social History   Tobacco Use  . Smoking status: Never Smoker  . Smokeless tobacco: Never Used  Vaping Use  . Vaping Use: Never used  Substance Use Topics  . Alcohol use: No  . Drug use: No      Current Outpatient Medications:  .  acetaminophen (TYLENOL) 500 MG tablet, Take 1,000 mg by mouth every 6 (six) hours as needed., Disp: , Rfl:  .  atorvastatin (LIPITOR) 10 MG tablet, Take 1 tablet (10 mg total) by mouth daily., Disp: 90 tablet, Rfl: 3 .  beta carotene w/minerals (OCUVITE)  tablet, Take 1 tablet by mouth daily., Disp: , Rfl:  .  Calcium Carbonate-Vitamin D (CALTRATE 600+D) 600-400 MG-UNIT per tablet, Take 1 tablet by mouth daily., Disp: , Rfl:  .  co-enzyme Q-10 50 MG capsule, Take 150 mg by mouth daily. , Disp: , Rfl:  .  diphenhydrAMINE (BENADRYL) 25 MG tablet, Take 25 mg by mouth every 6 (six) hours as needed., Disp: , Rfl:  .  EPINEPHrine 0.3 mg/0.3 mL IJ SOAJ injection, USE AS DIRECTED AS NEEDED, Disp: , Rfl:  .  fluticasone (FLONASE) 50 MCG/ACT nasal spray, Place into both nostrils daily., Disp: , Rfl:  .  guaiFENesin (MUCINEX) 600 MG 12 hr tablet, Take by mouth 2 (two) times daily., Disp: , Rfl:  .  ibuprofen (ADVIL,MOTRIN) 200 MG tablet, Take 200 mg by mouth every 6 (six) hours as needed., Disp: , Rfl:  .  levothyroxine (SYNTHROID) 25 MCG tablet, TAKE 1 TABLET BY MOUTH EVERY DAY BEFORE BREAKFAST, Disp: 90 tablet, Rfl: 3 .  loratadine (CLARITIN) 10 MG tablet, Take 10 mg by mouth as needed. , Disp: , Rfl:  .  ondansetron (ZOFRAN-ODT) 4 MG disintegrating tablet, Take 1 tablet (4  mg total) by mouth every 8 (eight) hours as needed for nausea or vomiting (migraine)., Disp: 15 tablet, Rfl: 1 .  telmisartan-hydrochlorothiazide (MICARDIS HCT) 40-12.5 MG tablet, TAKE 1 TABLET BY MOUTH EVERY DAY, Disp: 90 tablet, Rfl: 0 .  Turmeric 500 MG TABS, Take by mouth., Disp: , Rfl:  .  cefdinir (OMNICEF) 300 MG capsule, Take 1 capsule (300 mg total) by mouth 2 (two) times daily. If needed for sinus infection, Disp: 14 capsule, Rfl: 0 .  fluocinonide-emollient (LIDEX-E) 0.05 % cream, Apply 1 application topically 2 (two) times daily. (Patient not taking: Reported on 08/02/2019), Disp: 30 g, Rfl: 0 .  omeprazole (PRILOSEC) 20 MG capsule, Take 20 mg by mouth daily. (Patient not taking: Reported on 08/02/2019), Disp: , Rfl:  .  valsartan-hydrochlorothiazide (DIOVAN-HCT) 160-12.5 MG tablet, Take 1 tablet by mouth daily. (Patient not taking: Reported on 08/02/2019), Disp: 90 tablet, Rfl: 3  EXAM: BP Readings from Last 3 Encounters:  08/02/19 125/74  12/22/18 138/74  09/07/17 120/74    VITALS per patient if applicable:  GENERAL: alert, oriented, appears well and in no acute distress mildy congested  Nl speech   HEENT: atraumatic, conjunttiva clear, no obvious abnormalities on inspection of external nose and ears  NECK: normal movements of the head and neck  LUNGS: on inspection no signs of respiratory distress, breathing rate appears normal, no obvious gross SOB, gasping or wheezing  CV: no obvious cyanosis  PSYCH/NEURO: pleasant and cooperative, no obvious depression or anxiety, speech and thought processing grossly intact Lab Results  Component Value Date   WBC 4.9 12/29/2018   HGB 14.3 12/29/2018   HCT 42.9 12/29/2018   PLT 179.0 12/29/2018   GLUCOSE 107 (H) 12/29/2018   CHOL 153 12/29/2018   TRIG 104.0 12/29/2018   HDL 42.80 12/29/2018   LDLDIRECT 171.0 06/17/2006   LDLCALC 89 12/29/2018   ALT 18 12/29/2018   AST 12 12/29/2018   NA 137 12/29/2018   K 4.5 12/29/2018   CL  98 12/29/2018   CREATININE 0.89 12/29/2018   BUN 16 12/29/2018   CO2 32 12/29/2018   TSH 2.80 12/29/2018   HGBA1C 5.3 10/16/2014    ASSESSMENT AND PLAN:  Discussed the following assessment and plan:    ICD-10-CM   1. URI, acute  J06.9   2. History  of acute sinusitis  Z87.09    Ur congestion acts viral at risk for secondary infection  Pre flight trial aftrin and  Decongestant since BP controlled   Saline  mucinex ok   Send in antibiotic if progresses to  Similar bacterial like infection she has had in past  Agree with covid testing   Results pending   Counseled.   Expectant management and discussion of plan and treatment with opportunity to ask questions and all were answered. The patient agreed with the plan and demonstrated an understanding of the instructions.   Advised to call back or seek an in-person evaluation if worsening  or having  further concerns . In interim she will let us kow if  Needs to add antibiotic  Return if symptoms worsen or fail to improve.    Shanon Ace, MD

## 2019-09-30 ENCOUNTER — Other Ambulatory Visit: Payer: Self-pay | Admitting: Internal Medicine

## 2019-10-19 ENCOUNTER — Ambulatory Visit: Payer: Medicare Other

## 2019-10-19 ENCOUNTER — Other Ambulatory Visit: Payer: Self-pay

## 2019-10-19 DIAGNOSIS — Z0000 Encounter for general adult medical examination without abnormal findings: Secondary | ICD-10-CM

## 2019-10-19 NOTE — Patient Instructions (Signed)
Cheryl Robbins , Thank you for taking time to come for your Medicare Wellness Visit. I appreciate your ongoing commitment to your health goals. Please review the following plan we discussed and let me know if I can assist you in the future.   Screening recommendations/referrals: Colonoscopy: Done 02/25/13 Mammogram: Done 03/31/19 Bone Density: Done 05/24/12 Recommended yearly ophthalmology/optometry visit for glaucoma screening and checkup Recommended yearly dental visit for hygiene and checkup  Vaccinations: Influenza vaccine: Up to date Pneumococcal vaccine: Up to date Tdap vaccine: Up to date Shingles vaccine: Completed 01/03/17 & 03/10/17   Covid-19:Completed 2/7 & 04/20/19  Advanced directives: Advance directive discussed with you today. Even though you declined this today please call our office should you change your mind and we can give you the proper paperwork for you to fill out.  Conditions/risks identified: stay healthy  Next appointment: Follow up in one year for your annual wellness visit    Preventive Care 65 Years and Older, Female Preventive care refers to lifestyle choices and visits with your health care provider that can promote health and wellness. What does preventive care include?  A yearly physical exam. This is also called an annual well check.  Dental exams once or twice a year.  Routine eye exams. Ask your health care provider how often you should have your eyes checked.  Personal lifestyle choices, including:  Daily care of your teeth and gums.  Regular physical activity.  Eating a healthy diet.  Avoiding tobacco and drug use.  Limiting alcohol use.  Practicing safe sex.  Taking low-dose aspirin every day.  Taking vitamin and mineral supplements as recommended by your health care provider. What happens during an annual well check? The services and screenings done by your health care provider during your annual well check will depend on your age,  overall health, lifestyle risk factors, and family history of disease. Counseling  Your health care provider may ask you questions about your:  Alcohol use.  Tobacco use.  Drug use.  Emotional well-being.  Home and relationship well-being.  Sexual activity.  Eating habits.  History of falls.  Memory and ability to understand (cognition).  Work and work Statistician.  Reproductive health. Screening  You may have the following tests or measurements:  Height, weight, and BMI.  Blood pressure.  Lipid and cholesterol levels. These may be checked every 5 years, or more frequently if you are over 87 years old.  Skin check.  Lung cancer screening. You may have this screening every year starting at age 29 if you have a 30-pack-year history of smoking and currently smoke or have quit within the past 15 years.  Fecal occult blood test (FOBT) of the stool. You may have this test every year starting at age 74.  Flexible sigmoidoscopy or colonoscopy. You may have a sigmoidoscopy every 5 years or a colonoscopy every 10 years starting at age 28.  Hepatitis C blood test.  Hepatitis B blood test.  Sexually transmitted disease (STD) testing.  Diabetes screening. This is done by checking your blood sugar (glucose) after you have not eaten for a while (fasting). You may have this done every 1-3 years.  Bone density scan. This is done to screen for osteoporosis. You may have this done starting at age 9.  Mammogram. This may be done every 1-2 years. Talk to your health care provider about how often you should have regular mammograms. Talk with your health care provider about your test results, treatment options, and if necessary,  the need for more tests. Vaccines  Your health care provider may recommend certain vaccines, such as:  Influenza vaccine. This is recommended every year.  Tetanus, diphtheria, and acellular pertussis (Tdap, Td) vaccine. You may need a Td booster every 10  years.  Zoster vaccine. You may need this after age 59.  Pneumococcal 13-valent conjugate (PCV13) vaccine. One dose is recommended after age 31.  Pneumococcal polysaccharide (PPSV23) vaccine. One dose is recommended after age 63. Talk to your health care provider about which screenings and vaccines you need and how often you need them. This information is not intended to replace advice given to you by your health care provider. Make sure you discuss any questions you have with your health care provider. Document Released: 03/02/2015 Document Revised: 10/24/2015 Document Reviewed: 12/05/2014 Elsevier Interactive Patient Education  2017 [place] Prevention in the Home Falls can cause injuries. They can happen to people of all ages. There are many things you can do to make your home safe and to help prevent falls. What can I do on the outside of my home?  Regularly fix the edges of walkways and driveways and fix any cracks.  Remove anything that might make you trip as you walk through a door, such as a raised step or threshold.  Trim any bushes or trees on the path to your home.  Use bright outdoor lighting.  Clear any walking paths of anything that might make someone trip, such as rocks or tools.  Regularly check to see if handrails are loose or broken. Make sure that both sides of any steps have handrails.  Any raised decks and porches should have guardrails on the edges.  Have any leaves, snow, or ice cleared regularly.  Use sand or salt on walking paths during winter.  Clean up any spills in your garage right away. This includes oil or grease spills. What can I do in the bathroom?  Use night lights.  Install grab bars by the toilet and in the tub and shower. Do not use towel bars as grab bars.  Use non-skid mats or decals in the tub or shower.  If you need to sit down in the shower, use a plastic, non-slip stool.  Keep the floor dry. Clean up any water that  spills on the floor as soon as it happens.  Remove soap buildup in the tub or shower regularly.  Attach bath mats securely with double-sided non-slip rug tape.  Do not have throw rugs and other things on the floor that can make you trip. What can I do in the bedroom?  Use night lights.  Make sure that you have a light by your bed that is easy to reach.  Do not use any sheets or blankets that are too big for your bed. They should not hang down onto the floor.  Have a firm chair that has side arms. You can use this for support while you get dressed.  Do not have throw rugs and other things on the floor that can make you trip. What can I do in the kitchen?  Clean up any spills right away.  Avoid walking on wet floors.  Keep items that you use a lot in easy-to-reach places.  If you need to reach something above you, use a strong step stool that has a grab bar.  Keep electrical cords out of the way.  Do not use floor polish or wax that makes floors slippery. If you must use  wax, use non-skid floor wax.  Do not have throw rugs and other things on the floor that can make you trip. What can I do with my stairs?  Do not leave any items on the stairs.  Make sure that there are handrails on both sides of the stairs and use them. Fix handrails that are broken or loose. Make sure that handrails are as long as the stairways.  Check any carpeting to make sure that it is firmly attached to the stairs. Fix any carpet that is loose or worn.  Avoid having throw rugs at the top or bottom of the stairs. If you do have throw rugs, attach them to the floor with carpet tape.  Make sure that you have a light switch at the top of the stairs and the bottom of the stairs. If you do not have them, ask someone to add them for you. What else can I do to help prevent falls?  Wear shoes that:  Do not have high heels.  Have rubber bottoms.  Are comfortable and fit you well.  Are closed at the  toe. Do not wear sandals.  If you use a stepladder:  Make sure that it is fully opened. Do not climb a closed stepladder.  Make sure that both sides of the stepladder are locked into place.  Ask someone to hold it for you, if possible.  Clearly mark and make sure that you can see:  Any grab bars or handrails.  First and last steps.  Where the edge of each step is.  Use tools that help you move around (mobility aids) if they are needed. These include:  Canes.  Walkers.  Scooters.  Crutches.  Turn on the lights when you go into a dark area. Replace any light bulbs as soon as they burn out.  Set up your furniture so you have a clear path. Avoid moving your furniture around.  If any of your floors are uneven, fix them.  If there are any pets around you, be aware of where they are.  Review your medicines with your doctor. Some medicines can make you feel dizzy. This can increase your chance of falling. Ask your doctor what other things that you can do to help prevent falls. This information is not intended to replace advice given to you by your health care provider. Make sure you discuss any questions you have with your health care provider. Document Released: 11/30/2008 Document Revised: 07/12/2015 Document Reviewed: 03/10/2014 Elsevier Interactive Patient Education  2017 [employer].

## 2019-10-19 NOTE — Progress Notes (Addendum)
Virtual Visit via Telephone Note  I connected with  Cheryl Robbins on 10/19/19 at 10:15 AM EDT by telephone and verified that I am speaking with the correct person using two identifiers.  Medicare Annual Wellness visit completed telephonically due to Covid-19 pandemic.   Persons participating in this call: This Health Coach and this patient.   Location: Patient: Home Provider: Office   I discussed the limitations, risks, security and privacy concerns of performing an evaluation and management service by telephone and the availability of in person appointments. The patient expressed understanding and agreed to proceed.  Unable to perform video visit due to video visit attempted and failed and/or patient does not have video capability.   Some vital signs may be absent or patient reported.   Willette Brace, LPN    Subjective:   Cheryl Robbins is a 74 y.o. female who presents for Medicare Annual (Subsequent) preventive examination.  Review of Systems     Cardiac Risk Factors include: advanced age (>39men, >48 women);hypertension;dyslipidemia     Objective:    There were no vitals filed for this visit. There is no height or weight on file to calculate BMI.  Advanced Directives 10/19/2019  Does Patient Have a Medical Advance Directive? No  Would patient like information on creating a medical advance directive? No - Patient declined    Current Medications (verified) Outpatient Encounter Medications as of 10/19/2019  Medication Sig  . acetaminophen (TYLENOL) 500 MG tablet Take 1,000 mg by mouth every 6 (six) hours as needed.  Marland Kitchen atorvastatin (LIPITOR) 10 MG tablet Take 1 tablet (10 mg total) by mouth daily.  . beta carotene w/minerals (OCUVITE) tablet Take 1 tablet by mouth daily.  . Calcium Carbonate-Vitamin D (CALTRATE 600+D) 600-400 MG-UNIT per tablet Take 1 tablet by mouth daily.  Marland Kitchen co-enzyme Q-10 50 MG capsule Take 150 mg by mouth daily.   Marland Kitchen EPINEPHrine 0.3 mg/0.3 mL IJ  SOAJ injection USE AS DIRECTED AS NEEDED  . guaiFENesin (MUCINEX) 600 MG 12 hr tablet Take by mouth 2 (two) times daily.  Marland Kitchen ibuprofen (ADVIL,MOTRIN) 200 MG tablet Take 200 mg by mouth every 6 (six) hours as needed.  Marland Kitchen levothyroxine (SYNTHROID) 25 MCG tablet TAKE 1 TABLET BY MOUTH EVERY DAY BEFORE BREAKFAST  . telmisartan-hydrochlorothiazide (MICARDIS HCT) 40-12.5 MG tablet TAKE 1 TABLET BY MOUTH EVERY DAY  . Turmeric 500 MG TABS Take by mouth.  . diphenhydrAMINE (BENADRYL) 25 MG tablet Take 25 mg by mouth every 6 (six) hours as needed. (Patient not taking: Reported on 10/19/2019)  . fluticasone (FLONASE) 50 MCG/ACT nasal spray Place into both nostrils daily.  Marland Kitchen loratadine (CLARITIN) 10 MG tablet Take 10 mg by mouth as needed.  (Patient not taking: Reported on 10/19/2019)  . ondansetron (ZOFRAN-ODT) 4 MG disintegrating tablet Take 1 tablet (4 mg total) by mouth every 8 (eight) hours as needed for nausea or vomiting (migraine). (Patient not taking: Reported on 10/19/2019)  . [DISCONTINUED] cefdinir (OMNICEF) 300 MG capsule Take 1 capsule (300 mg total) by mouth 2 (two) times daily. If needed for sinus infection (Patient not taking: Reported on 10/19/2019)  . [DISCONTINUED] fluocinonide-emollient (LIDEX-E) 0.05 % cream Apply 1 application topically 2 (two) times daily. (Patient not taking: Reported on 08/02/2019)  . [DISCONTINUED] omeprazole (PRILOSEC) 20 MG capsule Take 20 mg by mouth daily. (Patient not taking: Reported on 08/02/2019)  . [DISCONTINUED] valsartan-hydrochlorothiazide (DIOVAN-HCT) 160-12.5 MG tablet Take 1 tablet by mouth daily. (Patient not taking: Reported on 08/02/2019)   No  facility-administered encounter medications on file as of 10/19/2019.    Allergies (verified) Penicillins, Ace inhibitors, Clindamycin/lincomycin, Sulfonamide derivatives, and Simvastatin   History: Past Medical History:  Diagnosis Date  . Allergy   . GERD (gastroesophageal reflux disease)   . Headache(784.0)   .  Hyperlipidemia   . Hypertension   . Low back pain   . Osteopenia   . 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 Primiparous   . Rosacea    Past Surgical History:  Procedure Laterality Date  . atypical moles    . 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 CATARACT EXTRACTION     Both eyes  . COLONOSCOPY    . EYE SURGERY    . OOPHORECTOMY    . tonsilletomy  1953   Family History  Problem Relation Age of Onset  . Atrial fibrillation Mother        had cv in her 62 s  . Arthritis Mother        had staph infection knee replacment  . Cancer Sister        thryoid  . Fibromyalgia Brother   . Colon cancer Neg Hx   . Rectal cancer Neg Hx   . Stomach cancer Neg Hx    Social History   Socioeconomic History  . Marital status: Not on file    Spouse name: Not on file  . Number of children: Not on file  . Years of education: Not on file  . Highest education level: Not on file  Occupational History  . Occupation: Retired  Tobacco Use  . Smoking status: Never Smoker  . Smokeless tobacco: Never Used  Vaping Use  . Vaping Use: Never used  Substance and Sexual Activity  . Alcohol use: No  . Drug use: No  . Sexual activity: Not on file  Other Topics Concern  . Not on file  Social History Narrative   hh of 1   No pets   No ets.   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 Difficulty of Paying Living Expenses: Not hard at all  Food Insecurity: No Food Insecurity  . Worried About Charity fundraiser in the Last Year: Never true  . Ran Out of Food in the Last Year: Never true  Transportation Needs: No Transportation Needs  . Lack of Transportation (Medical): No  . Lack of  Transportation (Non-Medical): No  Physical Activity: Insufficiently Active  . Days of Exercise per Week: 2 days  . Minutes of Exercise per Session: 30 min  Stress: No Stress Concern Present  . Feeling of Stress : Not at all  Social Connections: Socially Isolated  . Frequency of Communication with Friends and Family: More than three times a week  . Frequency of Social Gatherings with Friends and Family: Three times a week  . Attends Religious Services: Never  . Active Member of Clubs or Organizations: No  . Attends Archivist Meetings: Never  . Marital Status: Widowed    Tobacco Counseling Counseling given: Not Answered   Clinical Intake:  Pre-visit preparation completed: Yes  Pain : No/denies pain     BMI - recorded: 25.46 Nutritional Status: BMI 25 -29 Overweight Diabetes: No  How often do you need to have someone help you when you read instructions, pamphlets, or other written materials from your doctor or pharmacy?: 1 - Never  Diabetic?No  Interpreter Needed?: No  Information entered by :: Charlott Rakes, LPN   Activities of Daily Living In your present state of health, do you have any difficulty performing the following activities: 10/19/2019  Hearing? N  Vision? N  Difficulty concentrating or making decisions? N  Walking or climbing stairs? N  Dressing or bathing? N  Doing errands, shopping? N  Preparing Food and eating ? N  Using the Toilet? N  In the past six months, have you accidently leaked urine? N  Do you have problems with loss of bowel control? N  Managing your Medications? N  Managing your Finances? N  Housekeeping or managing your Housekeeping? N  Some recent data might be hidden    Patient Care Team: Panosh, Standley Brooking, MD as PCP - General Ernesto Rutherford Maudry Mayhew, MD (Otolaryngology) Allyn Kenner, MD (Dermatology) Latanya Maudlin, MD (Orthopedic Surgery) ed Eddie Dibbles (Ophthalmology) Tiajuana Amass, MD as Consulting Physician (Allergy and  Immunology)  Indicate any recent Medical Services you may have received from other than [facility] providers in the past year (date may be approximate).     Assessment:   This is a routine wellness examination for Cheryl Robbins.  Hearing/Vision screen  Hearing Screening   125Hz  250Hz  500Hz  1000Hz  2000Hz  3000Hz  4000Hz  6000Hz  8000Hz   Right ear:           Left ear:           Comments: Pt denies any difficulty hearing at this time  Vision Screening Comments: Follows up annually with Dr Bo Merino  Dietary issues and exercise activities discussed: Current Exercise Habits: Home exercise routine, Type of exercise: walking;strength training/weights, Time (Minutes): 30, Frequency (Times/Week): 2, Weekly Exercise (Minutes/Week): 60  Goals    . Patient Stated     Stay healthy      Depression Screen PHQ 2/9 Scores 10/19/2019 12/22/2018 09/07/2017 04/28/2016 08/22/2015 08/15/2014 08/15/2014  PHQ - 2 Score 0 0 0 1 1 0 0    Fall Risk Fall Risk  10/19/2019 12/22/2018 09/07/2017 04/28/2016 08/22/2015  Falls in the past year? 0 0 No No No  Number falls in past yr: 0 0 - - -  Injury with Fall? 0 0 - - -  Risk for fall due to : Impaired vision - - - -  Follow up Falls prevention discussed Falls evaluation completed - - -    Any stairs in or around the home? Yes  If so, are there any without handrails? No  Home free of loose throw rugs in walkways, pet beds, electrical cords, etc? Yes  Adequate lighting in your home to reduce risk of falls? Yes   ASSISTIVE DEVICES UTILIZED TO PREVENT FALLS:  Life alert? No  Use of a cane, walker or w/c? No  Grab bars in the bathroom? No  Shower chair or bench in shower? No  Elevated toilet seat or a handicapped toilet? No   TIMED UP AND GO:  Was the test performed? No .    Cognitive Function:     6CIT Screen 10/19/2019  What Year? 0 points  What month? 0 points  Count back from 20 0 points  Months in reverse 0 points  Repeat phrase 0 points  Immunizations Immunization History  Administered Date(s) Administered  . Fluad Quad(high Dose 65+) 11/20/2018  . Influenza Split 11/26/2010, 12/05/2011  . Influenza Whole 11/27/2006, 12/01/2007, 11/29/2008  . Influenza, High Dose Seasonal PF 12/06/2013, 11/06/2014, 11/25/2015, 11/22/2016, 11/16/2017  . Influenza,inj,Quad PF,6+ Mos 10/21/2012  . Influenza-Unspecified 11/22/2016  . PFIZER SARS-COV-2 Vaccination 03/27/2019, 04/20/2019  . Pneumococcal Conjugate-13 06/24/2013  . Pneumococcal Polysaccharide-23 12/04/2009  . Td 07/30/2006  . Tdap 08/22/2015  . Zoster 02/19/2009  . Zoster Recombinat (Shingrix) 01/03/2017, 03/10/2017    TDAP status: Up to date Flu Vaccine status: Up to date Pneumococcal vaccine status: Up to date Covid-19 vaccine status: Completed vaccines  Qualifies for Shingles Vaccine? Yes   Zostavax completed Yes   Shingrix Completed?: Yes  Screening Tests Health Maintenance  Topic Date Due  . INFLUENZA VACCINE  09/18/2019  . COLONOSCOPY  02/26/2023  . TETANUS/TDAP  08/21/2025  . DEXA SCAN  Completed  . COVID-19 Vaccine  Completed  . Hepatitis C Screening  Completed  . PNA vac Low Risk Adult  Completed    Health Maintenance  Health Maintenance Due  Topic Date Due  . INFLUENZA VACCINE  09/18/2019    Colorectal cancer screening: Completed 02/25/13. Repeat every 10 years Mammogram status: Completed 03/31/19. Repeat every year Bone Density status: Completed 05/24/12. Results reflect: Bone density results: OSTEOPENIA. Repeat every 2 years.      Additional Screening:  Hepatitis C Screening:  Completed 08/22/15  Vision Screening: Recommended annual ophthalmology exams for early detection of glaucoma and other disorders of the eye. Is the patient up to date with their annual eye exam?  Yes  Who is the provider or what is the name of the office in which the patient attends annual eye exams? Dr Bo Merino  Dental Screening: Recommended annual  dental exams for proper oral hygiene  Community Resource Referral / Chronic Care Management: CRR required this visit?  No   CCM required this visit?  No      Plan:     I have personally reviewed and noted the following in the patient's chart:   . Medical and social history . Use of alcohol, tobacco or illicit drugs  . Current medications and supplements . Functional ability and status . Nutritional status . Physical activity . Advanced directives . List of other physicians . Hospitalizations, surgeries, and ER visits in previous 12 months . Vitals . Screenings to include cognitive, depression, and falls . Referrals and appointments  In addition, I have reviewed and discussed with patient certain preventive protocols, quality metrics, and best practice recommendations. A written personalized care plan for preventive services as well as general preventive health recommendations were provided to patient.     Willette Brace, LPN   08/25/5883   Nurse Notes: None

## 2019-11-22 NOTE — Telephone Encounter (Signed)
Please order the dexa scan at [facility]   Dx  Estrogen deficiency

## 2019-11-23 ENCOUNTER — Telehealth: Payer: Self-pay | Admitting: Internal Medicine

## 2019-11-23 DIAGNOSIS — E2839 Other primary ovarian failure: Secondary | ICD-10-CM

## 2019-11-23 NOTE — Telephone Encounter (Signed)
Per PCP ok to order BMD Scan to [facility]/thx dmf

## 2019-11-23 NOTE — Addendum Note (Signed)
Addended by: Marrion Coy on: 11/23/2019 01:38 PM   Modules accepted: Orders

## 2019-12-29 ENCOUNTER — Other Ambulatory Visit: Payer: Self-pay | Admitting: Internal Medicine

## 2019-12-29 DIAGNOSIS — E039 Hypothyroidism, unspecified: Secondary | ICD-10-CM

## 2019-12-29 NOTE — Telephone Encounter (Signed)
Last OV 12/22/18, Medicare Wellness Visit 10/19/19 Last refill Telmisartan-HCT 09/30/19 #90/0                 Levothyroxine 12/22/18 #90/3                 Atorvastatin 12/22/18 #90/3 Next OV 01/10/20

## 2020-01-10 ENCOUNTER — Other Ambulatory Visit: Payer: Self-pay

## 2020-01-10 ENCOUNTER — Encounter: Payer: Self-pay | Admitting: Internal Medicine

## 2020-01-10 ENCOUNTER — Ambulatory Visit (INDEPENDENT_AMBULATORY_CARE_PROVIDER_SITE_OTHER): Payer: Medicare Other | Admitting: Internal Medicine

## 2020-01-10 VITALS — BP 156/94 | HR 78 | Temp 98.4°F | Ht 65.0 in | Wt 160.4 lb

## 2020-01-10 DIAGNOSIS — E039 Hypothyroidism, unspecified: Secondary | ICD-10-CM

## 2020-01-10 DIAGNOSIS — E785 Hyperlipidemia, unspecified: Secondary | ICD-10-CM | POA: Diagnosis not present

## 2020-01-10 DIAGNOSIS — Z8612 Personal history of poliomyelitis: Secondary | ICD-10-CM | POA: Diagnosis not present

## 2020-01-10 DIAGNOSIS — Z79899 Other long term (current) drug therapy: Secondary | ICD-10-CM | POA: Diagnosis not present

## 2020-01-10 DIAGNOSIS — I10 Essential (primary) hypertension: Secondary | ICD-10-CM

## 2020-01-10 DIAGNOSIS — Z0001 Encounter for general adult medical examination with abnormal findings: Secondary | ICD-10-CM

## 2020-01-10 MED ORDER — TELMISARTAN-HCTZ 40-12.5 MG PO TABS
1.5000 | ORAL_TABLET | Freq: Every day | ORAL | 2 refills | Status: DC
Start: 2020-01-10 — End: 2020-03-06

## 2020-01-10 NOTE — Patient Instructions (Addendum)
Will notify you  of labs when available.   BP  Goal is below 135 /85 and best  120/80 range   Increase telmisartan hctz to 1.5 tabs   Take blood pressure readings twice a day for 5-7 days and record .     Take 2 -3 readings at each sitting .   Can send in readings  by My Chart.  Then  Send in more readings after  A month     Before checking your blood pressure make sure: You are seated and quite for 5 min before checking Feet are flat on the floor Siting in chair with your back supported straight up and down Arm resting on table or arm of chair at heart level Bladder is empty You have NOT had caffeine or tobacco within the last 30 min  Dexa scan   Call for appt  [facility] [place] [place] [address] (Across from [facility] ) [phone] 8:30AM-5:15 pm     Health Maintenance, Female Adopting a healthy lifestyle and getting preventive care are important in promoting health and wellness. Ask your health care provider about:  The right schedule for you to have regular tests and exams.  Things you can do on your own to prevent diseases and keep yourself healthy. What should I know about diet, weight, and exercise? Eat a healthy diet   Eat a diet that includes plenty of vegetables, fruits, low-fat dairy products, and lean protein.  Do not eat a lot of foods that are high in solid fats, added sugars, or sodium. Maintain a healthy weight Body mass index (BMI) is used to identify weight problems. It estimates body fat based on height and weight. Your health care provider can help determine your BMI and help you achieve or maintain a healthy weight. Get regular exercise Get regular exercise. This is one of the most important things you can do for your health. Most adults should:  Exercise for at least 150 minutes each week. The exercise should increase your heart rate and make you sweat (moderate-intensity exercise).  Do strengthening exercises  at least twice a week. This is in addition to the moderate-intensity exercise.  Spend less time sitting. Even light physical activity can be beneficial. Watch cholesterol and blood lipids Have your blood tested for lipids and cholesterol at 75 years of age, then have this test every 5 years. Have your cholesterol levels checked more often if:  Your lipid or cholesterol levels are high.  You are older than 75 years of age.  You are at high risk for heart disease. What should I know about cancer screening? Depending on your health history and family history, you may need to have cancer screening at various ages. This may include screening for:  Breast cancer.  Cervical cancer.  Colorectal cancer.  Skin cancer.  Lung cancer. What should I know about heart disease, diabetes, and high blood pressure? Blood pressure and heart disease  High blood pressure causes heart disease and increases the risk of stroke. This is more likely to develop in people who have high blood pressure readings, are of African descent, or are overweight.  Have your blood pressure checked: ? Every 3-5 years if you are 35-5 years of age. ? Every year if you are 29 years old or older. Diabetes Have regular diabetes screenings. This checks your fasting blood sugar level. Have the screening done:  Once every three years after age 18 if you are at a normal  weight and have a low risk for diabetes.  More often and at a younger age if you are overweight or have a high risk for diabetes. What should I know about preventing infection? Hepatitis B If you have a higher risk for hepatitis B, you should be screened for this virus. Talk with your health care provider to find out if you are at risk for hepatitis B infection. Hepatitis C Testing is recommended for:  Everyone born from [birth year] through 1966.  Anyone with known risk factors for hepatitis C. Sexually transmitted infections (STIs)  Get screened for STIs,  including gonorrhea and chlamydia, if: ? You are sexually active and are younger than 75 years of age. ? You are older than 75 years of age and your health care provider tells you that you are at risk for this type of infection. ? Your sexual activity has changed since you were last screened, and you are at increased risk for chlamydia or gonorrhea. Ask your health care provider if you are at risk.  Ask your health care provider about whether you are at high risk for HIV. Your health care provider may recommend a prescription medicine to help prevent HIV infection. If you choose to take medicine to prevent HIV, you should first get tested for HIV. You should then be tested every 3 months for as long as you are taking the medicine. Pregnancy  If you are about to stop having your period (premenopausal) and you may become pregnant, seek counseling before you get pregnant.  Take 400 to 800 micrograms (mcg) of folic acid every day if you become pregnant.  Ask for birth control (contraception) if you want to prevent pregnancy. Osteoporosis and menopause Osteoporosis is a disease in which the bones lose minerals and strength with aging. This can result in bone fractures. If you are 37 years old or older, or if you are at risk for osteoporosis and fractures, ask your health care provider if you should:  Be screened for bone loss.  Take a calcium or vitamin D supplement to lower your risk of fractures.  Be given hormone replacement therapy (HRT) to treat symptoms of menopause. Follow these instructions at home: Lifestyle  Do not use any products that contain nicotine or tobacco, such as cigarettes, e-cigarettes, and chewing tobacco. If you need help quitting, ask your health care provider.  Do not use street drugs.  Do not share needles.  Ask your health care provider for help if you need support or information about quitting drugs. Alcohol use  Do not drink alcohol if: ? Your health care  provider tells you not to drink. ? You are pregnant, may be pregnant, or are planning to become pregnant.  If you drink alcohol: ? Limit how much you use to 0-1 drink a day. ? Limit intake if you are breastfeeding.  Be aware of how much alcohol is in your drink. In the [place] one drink equals one 12 oz bottle of beer (355 mL), one 5 oz glass of wine (148 mL), or one 1 oz glass of hard liquor (44 mL). General instructions  Schedule regular health, dental, and eye exams.  Stay current with your vaccines.  Tell your health care provider if: ? You often feel depressed. ? You have ever been abused or do not feel safe at home. Summary  Adopting a healthy lifestyle and getting preventive care are important in promoting health and wellness.  Follow your health care provider's instructions about healthy diet, exercising,  and getting tested or screened for diseases.  Follow your health care provider's instructions on monitoring your cholesterol and blood pressure. This information is not intended to replace advice given to you by your health care provider. Make sure you discuss any questions you have with your health care provider. Document Revised: 01/27/2018 Document Reviewed: 01/27/2018 Elsevier Patient Education  2020 [employer].

## 2020-01-10 NOTE — Progress Notes (Signed)
Chief Complaint  Patient presents with  . Annual Exam    CPE, no concerns. Patient fasting for labs.  . Medication Management  . Hypertension    HPI: Cheryl Robbins 74 y.o. comes in today for Preventive Medicare exam/ wellness visit .and HCM   Thyroid   meds   bp 138 142  / over 70  meds  No change no se   HLD on meds  Taking  Allergy shots help a good bit but gets  Congestion on  meds and drops  Health Maintenance  Topic Date Due  . COLONOSCOPY  02/26/2023  . TETANUS/TDAP  08/21/2025  . INFLUENZA VACCINE  Completed  . DEXA SCAN  Completed  . COVID-19 Vaccine  Completed  . Hepatitis C Screening  Completed  . PNA vac Low Risk Adult  Completed   Health Maintenance Review LIFESTYLE:  Exercise:  Walks   Active  Tobacco/ETS:n Alcohol: n Sugar beverages: Sleep:8 Drug use: no    Hearing: ok  Vision:  No limitations at present . Last eye check UTD  Safety:  Has smoke detector and wears seat belts.  No firearms. No excess sun exposure. Sees dentist regularly.  Falls: n  Memory: Felt to be good  , no concern from her or her family.  Depression: No anhedonia unusual crying or depressive symptoms  Nutrition: Eats well balanced diet; adequate calcium and vitamin D. No swallowing chewing problems.  Injury: no major injuries in the last six months.  Other healthcare providers:  Reviewed today .  Preventive parameters: up-to-date  Reviewed   utd on immunizations   ADLS:   There are no problems or need for assistance  driving, feeding, obtaining food, dressing, toileting and bathing, managing money using phone. She is independent.   ROS:  Minor residual weakness lle from  Polio as a child  GEN/ HEENT: No fever, significant weight changes sweats headaches vision problems hearing changes, CV/ PULM; No chest pain shortness of breath cough, syncope,edema  change in exercise tolerance. GI /GU: No adominal pain, vomiting, change in bowel habits. No blood in the stool.  No significant GU symptoms. SKIN/HEME: ,no acute skin rashes suspicious lesions or bleeding. No lymphadenopathy, nodules, masses.  NEURO/ PSYCH:  No neurologic signs such as weakness numbness. No depression anxiety. IMM/ Allergy: No unusual infections.  Allergy .   Shots   REST of 12 system review negative except as per HPI   Past Medical History:  Diagnosis Date  . Allergy   . GERD (gastroesophageal reflux disease)   . Headache(784.0)   . Hyperlipidemia   . Hypertension   . Low back pain   . Osteopenia   . 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 Primiparous   . Rosacea     Family History  Problem Relation Age of Onset  . Atrial fibrillation Mother        had cv in her 20 s  . Arthritis Mother        had staph infection knee replacment  . Cancer Sister        thryoid  . Fibromyalgia Brother   . Colon cancer Neg Hx   . Rectal cancer Neg Hx   . Stomach cancer Neg Hx     Social History   Socioeconomic History  . Marital status: Unknown    Spouse name: Not on file  . Number of children: Not on file  . Years of education:  Not on file  . Highest education level: Not on file  Occupational History  . Occupation: Retired  Tobacco Use  . Smoking status: Never Smoker  . Smokeless tobacco: Never Used  Vaping Use  . Vaping Use: Never used  Substance and Sexual Activity  . Alcohol use: No  . Drug use: No  . Sexual activity: Not on file  Other Topics Concern  . Not on file  Social History Narrative   hh of 1   No pets   No ets.   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 Difficulty of Paying Living Expenses: Not hard at all  Food Insecurity: No Food Insecurity  . Worried About Charity fundraiser in the Last  Year: Never true  . Ran Out of Food in the Last Year: Never true  Transportation Needs: No Transportation Needs  . Lack of Transportation (Medical): No  . Lack of Transportation (Non-Medical): No  Physical Activity: Insufficiently Active  . Days of Exercise per Week: 2 days  . Minutes of Exercise per Session: 30 min  Stress: No Stress Concern Present  . Feeling of Stress : Not at all  Social Connections: Socially Isolated  . Frequency of Communication with Friends and Family: More than three times a week  . Frequency of Social Gatherings with Friends and Family: Three times a week  . Attends Religious Services: Never  . Active Member of Clubs or Organizations: No  . Attends Archivist Meetings: Never  . Marital Status: Widowed    Outpatient Encounter Medications as of 01/10/2020  Medication Sig  . atorvastatin (LIPITOR) 10 MG tablet TAKE 1 TABLET BY MOUTH EVERY DAY  . beta carotene w/minerals (OCUVITE) tablet Take 1 tablet by mouth daily.  . Calcium Carbonate-Vitamin D (CALTRATE 600+D) 600-400 MG-UNIT per tablet Take 1 tablet by mouth daily.  Marland Kitchen co-enzyme Q-10 50 MG capsule Take 150 mg by mouth daily.   . diphenhydrAMINE (BENADRYL) 25 MG tablet Take 25 mg by mouth every 6 (six) hours as needed.   Marland Kitchen EPINEPHrine 0.3 mg/0.3 mL IJ SOAJ injection USE AS DIRECTED AS NEEDED  . fluticasone (FLONASE) 50 MCG/ACT nasal spray Place into both nostrils daily.  Marland Kitchen guaiFENesin (MUCINEX) 600 MG 12 hr tablet Take by mouth 2 (two) times daily.  Marland Kitchen ibuprofen (ADVIL,MOTRIN) 200 MG tablet Take 200 mg by mouth every 6 (six) hours as needed.  Marland Kitchen levothyroxine (SYNTHROID) 25 MCG tablet TAKE 1 TABLET BY MOUTH EVERY DAY BEFORE BREAKFAST  . telmisartan-hydrochlorothiazide (MICARDIS HCT) 40-12.5 MG tablet Take 1.5 tablets by mouth daily.  . Turmeric 500 MG TABS Take by mouth.  . [DISCONTINUED] telmisartan-hydrochlorothiazide (MICARDIS HCT) 40-12.5 MG tablet TAKE 1 TABLET BY MOUTH EVERY DAY  .  acetaminophen (TYLENOL) 500 MG tablet Take 1,000 mg by mouth every 6 (six) hours as needed. (Patient not taking: Reported on 01/10/2020)  . loratadine (CLARITIN) 10 MG tablet Take 10 mg by mouth as needed.  (Patient not taking: Reported on 10/19/2019)  . ondansetron (ZOFRAN-ODT) 4 MG disintegrating tablet Take 1 tablet (4 mg total) by mouth every 8 (eight) hours as needed for nausea or vomiting (migraine). (Patient not taking: Reported on 10/19/2019)   No facility-administered encounter  medications on file as of 01/10/2020.    EXAM:  BP (!) 156/94   Pulse 78   Temp 98.4 F (36.9 C) (Oral)   Ht 5\' 5"  (1.651 m)   Wt 160 lb 6.4 oz (72.8 kg)   SpO2 99%   BMI 26.69 kg/m   Body mass index is 26.69 kg/m.  Physical Exam: Vital signs reviewed VOZ:DGUY is a well-developed well-nourished alert cooperative   who appears stated age in no acute distress.  HEENT: normocephalic atraumatic , Eyes: PERRL EOM's full, conjunctiva clear, Nares: paten,t no deformity discharge or tenderness., Ears: no deformity EAC's clear TMs with normal landmarks. Mouth: masked r. NECK: supple without masses, thyromegaly or bruits. CHEST/PULM:  Clear to auscultation and percussion breath sounds equal no wheeze , rales or rhonchi.Breast: normal by inspection . No dimpling, discharge, masses, tenderness or discharge . CV: PMI is nondisplaced, S1 S2 no gallops, murmurs, rubs. Peripheral pulses are full without delay.No JVD .  ABDOMEN: Bowel sounds normal nontender  No guard or rebound, no hepato splenomegal no CVA tenderness.   Extremtities:  No clubbing cyanosis or edema, no acute joint swelling or redness no focal atrophy NEURO:  Oriented x3, cranial nerves 3-12 appear to be intact, gait within normal limits no abnormal reflexes or asymmetrical dec left achilles   SKIN: No acute rashes normal turgor, color, no bruising or petechiae. PSYCH: Oriented, good eye contact, no obvious depression anxiety, cognition and judgment  appear normal. LN: no cervical axillary inguinal adenopathy No noted deficits in memory, attention, and speech.   Lab Results  Component Value Date   WBC 4.9 12/29/2018   HGB 14.3 12/29/2018   HCT 42.9 12/29/2018   PLT 179.0 12/29/2018   GLUCOSE 107 (H) 12/29/2018   CHOL 153 12/29/2018   TRIG 104.0 12/29/2018   HDL 42.80 12/29/2018   LDLDIRECT 171.0 06/17/2006   LDLCALC 89 12/29/2018   ALT 18 12/29/2018   AST 12 12/29/2018   NA 137 12/29/2018   K 4.5 12/29/2018   CL 98 12/29/2018   CREATININE 0.89 12/29/2018   BUN 16 12/29/2018   CO2 32 12/29/2018   TSH 2.80 12/29/2018   HGBA1C 5.3 10/16/2014    ASSESSMENT AND PLAN:  Discussed the following assessment and plan:  Encounter for preventative adult health care exam with abnormal findings  Medication management - Plan: CBC with Differential/Platelet, BASIC METABOLIC PANEL WITH GFR, Lipid panel, Hepatic function panel, TSH, TSH, Hepatic function panel, Lipid panel, BASIC METABOLIC PANEL WITH GFR, CBC with Differential/Platelet  Essential hypertension - Plan: CBC with Differential/Platelet, BASIC METABOLIC PANEL WITH GFR, Lipid panel, Hepatic function panel, TSH, TSH, Hepatic function panel, Lipid panel, BASIC METABOLIC PANEL WITH GFR, CBC with Differential/Platelet  Hyperlipidemia, unspecified hyperlipidemia type - Plan: CBC with Differential/Platelet, BASIC METABOLIC PANEL WITH GFR, Lipid panel, Hepatic function panel, TSH, TSH, Hepatic function panel, Lipid panel, BASIC METABOLIC PANEL WITH GFR, CBC with Differential/Platelet  Hypothyroidism, unspecified type - Plan: CBC with Differential/Platelet, BASIC METABOLIC PANEL WITH GFR, Lipid panel, Hepatic function panel, TSH, TSH, Hepatic function panel, Lipid panel, BASIC METABOLIC PANEL WITH GFR, CBC with Differential/Platelet  Personal history of poliomyelitis Increase  To take 1.5  telmisartan hctz   And plan bp fu baseline and in 1-2 mos  Virtual visit  Continue her other  medicines lifestyle intervention Option to get DEXA at either [place] or [place] [facility] Parody previous was at [facility] information given that she can make an appointment. Patient Care Team: Burnis Medin, MD as  PCP - General Thornell Sartorius, MD (Otolaryngology) Allyn Kenner, MD (Dermatology) Latanya Maudlin, MD (Orthopedic Surgery) ed Eddie Dibbles (Ophthalmology) Tiajuana Amass, MD as Consulting Physician (Allergy and Immunology)  Patient Instructions  Will notify you  of labs when available.   BP  Goal is below 135 /85 and best  120/80 range   Increase telmisartan hctz to 1.5 tabs   Take blood pressure readings twice a day for 5-7 days and record .     Take 2 -3 readings at each sitting .   Can send in readings  by My Chart.  Then  Send in more readings after  A month     Before checking your blood pressure make sure: You are seated and quite for 5 min before checking Feet are flat on the floor Siting in chair with your back supported straight up and down Arm resting on table or arm of chair at heart level Bladder is empty You have NOT had caffeine or tobacco within the last 30 min  Dexa scan   Call for appt  [facility] [address] [place] [id number] (Across from [facility] ) [phone] 8:30AM-5:15 pm     Health Maintenance, Female Adopting a healthy lifestyle and getting preventive care are important in promoting health and wellness. Ask your health care provider about:  The right schedule for you to have regular tests and exams.  Things you can do on your own to prevent diseases and keep yourself healthy. What should I know about diet, weight, and exercise? Eat a healthy diet   Eat a diet that includes plenty of vegetables, fruits, low-fat dairy products, and lean protein.  Do not eat a lot of foods that are high in solid fats, added sugars, or sodium. Maintain a healthy weight Body mass index (BMI) is used to identify weight problems. It  estimates body fat based on height and weight. Your health care provider can help determine your BMI and help you achieve or maintain a healthy weight. Get regular exercise Get regular exercise. This is one of the most important things you can do for your health. Most adults should:  Exercise for at least 150 minutes each week. The exercise should increase your heart rate and make you sweat (moderate-intensity exercise).  Do strengthening exercises at least twice a week. This is in addition to the moderate-intensity exercise.  Spend less time sitting. Even light physical activity can be beneficial. Watch cholesterol and blood lipids Have your blood tested for lipids and cholesterol at 75 years of age, then have this test every 5 years. Have your cholesterol levels checked more often if:  Your lipid or cholesterol levels are high.  You are older than 75 years of age.  You are at high risk for heart disease. What should I know about cancer screening? Depending on your health history and family history, you may need to have cancer screening at various ages. This may include screening for:  Breast cancer.  Cervical cancer.  Colorectal cancer.  Skin cancer.  Lung cancer. What should I know about heart disease, diabetes, and high blood pressure? Blood pressure and heart disease  High blood pressure causes heart disease and increases the risk of stroke. This is more likely to develop in people who have high blood pressure readings, are of African descent, or are overweight.  Have your blood pressure checked: ? Every 3-5 years if you are 77-59 years of age. ? Every year if you are 40 years  old or older. Diabetes Have regular diabetes screenings. This checks your fasting blood sugar level. Have the screening done:  Once every three years after age 21 if you are at a normal weight and have a low risk for diabetes.  More often and at a younger age if you are overweight or have a high  risk for diabetes. What should I know about preventing infection? Hepatitis B If you have a higher risk for hepatitis B, you should be screened for this virus. Talk with your health care provider to find out if you are at risk for hepatitis B infection. Hepatitis C Testing is recommended for:  Everyone born from [birth year] through 1966.  Anyone with known risk factors for hepatitis C. Sexually transmitted infections (STIs)  Get screened for STIs, including gonorrhea and chlamydia, if: ? You are sexually active and are younger than 75 years of age. ? You are older than 75 years of age and your health care provider tells you that you are at risk for this type of infection. ? Your sexual activity has changed since you were last screened, and you are at increased risk for chlamydia or gonorrhea. Ask your health care provider if you are at risk.  Ask your health care provider about whether you are at high risk for HIV. Your health care provider may recommend a prescription medicine to help prevent HIV infection. If you choose to take medicine to prevent HIV, you should first get tested for HIV. You should then be tested every 3 months for as long as you are taking the medicine. Pregnancy  If you are about to stop having your period (premenopausal) and you may become pregnant, seek counseling before you get pregnant.  Take 400 to 800 micrograms (mcg) of folic acid every day if you become pregnant.  Ask for birth control (contraception) if you want to prevent pregnancy. Osteoporosis and menopause Osteoporosis is a disease in which the bones lose minerals and strength with aging. This can result in bone fractures. If you are 82 years old or older, or if you are at risk for osteoporosis and fractures, ask your health care provider if you should:  Be screened for bone loss.  Take a calcium or vitamin D supplement to lower your risk of fractures.  Be given hormone replacement therapy (HRT) to treat  symptoms of menopause. Follow these instructions at home: Lifestyle  Do not use any products that contain nicotine or tobacco, such as cigarettes, e-cigarettes, and chewing tobacco. If you need help quitting, ask your health care provider.  Do not use street drugs.  Do not share needles.  Ask your health care provider for help if you need support or information about quitting drugs. Alcohol use  Do not drink alcohol if: ? Your health care provider tells you not to drink. ? You are pregnant, may be pregnant, or are planning to become pregnant.  If you drink alcohol: ? Limit how much you use to 0-1 drink a day. ? Limit intake if you are breastfeeding.  Be aware of how much alcohol is in your drink. In the [place] one drink equals one 12 oz bottle of beer (355 mL), one 5 oz glass of wine (148 mL), or one 1 oz glass of hard liquor (44 mL). General instructions  Schedule regular health, dental, and eye exams.  Stay current with your vaccines.  Tell your health care provider if: ? You often feel depressed. ? You have ever been abused or do  not feel safe at home. Summary  Adopting a healthy lifestyle and getting preventive care are important in promoting health and wellness.  Follow your health care provider's instructions about healthy diet, exercising, and getting tested or screened for diseases.  Follow your health care provider's instructions on monitoring your cholesterol and blood pressure. This information is not intended to replace advice given to you by your health care provider. Make sure you discuss any questions you have with your health care provider. Document Revised: 01/27/2018 Document Reviewed: 01/27/2018 Elsevier Patient Education  2020 [place] Doster M.D.

## 2020-01-11 LAB — BASIC METABOLIC PANEL WITH GFR
BUN: 16 mg/dL (ref 7–25)
CO2: 31 mmol/L (ref 20–32)
Calcium: 9.6 mg/dL (ref 8.6–10.4)
Chloride: 101 mmol/L (ref 98–110)
Creat: 0.86 mg/dL (ref 0.60–0.93)
GFR, Est African American: 77 mL/min/{1.73_m2} (ref >=60)
GFR, Est Non African American: 66 mL/min/{1.73_m2} (ref >=60)
Glucose, Bld: 91 mg/dL (ref 65–99)
Potassium: 4.2 mmol/L (ref 3.5–5.3)
Sodium: 141 mmol/L (ref 135–146)

## 2020-01-11 LAB — CBC WITH DIFFERENTIAL/PLATELET
Absolute Monocytes: 313 cells/uL (ref 200–950)
Basophils Absolute: 18 cells/uL (ref 0–200)
Basophils Relative: 0.4 %
Eosinophils Absolute: 322 cells/uL (ref 15–500)
Eosinophils Relative: 7 %
HCT: 43.6 % (ref 35.0–45.0)
Hemoglobin: 14.5 g/dL (ref 11.7–15.5)
Lymphs Abs: 1283 cells/uL (ref 850–3900)
MCH: 28.2 pg (ref 27.0–33.0)
MCHC: 33.3 g/dL (ref 32.0–36.0)
MCV: 84.7 fL (ref 80.0–100.0)
MPV: 10.7 fL (ref 7.5–12.5)
Monocytes Relative: 6.8 %
Neutro Abs: 2663 cells/uL (ref 1500–7800)
Neutrophils Relative %: 57.9 %
Platelets: 191 10*3/uL (ref 140–400)
RBC: 5.15 10*6/uL — ABNORMAL HIGH (ref 3.80–5.10)
RDW: 12.7 % (ref 11.0–15.0)
Total Lymphocyte: 27.9 %
WBC: 4.6 10*3/uL (ref 3.8–10.8)

## 2020-01-11 LAB — LIPID PANEL
Cholesterol: 148 mg/dL (ref <200)
HDL: 44 mg/dL — ABNORMAL LOW (ref >=50)
LDL Cholesterol (Calc): 83 mg/dL (calc)
Non-HDL Cholesterol (Calc): 104 mg/dL (calc) (ref <130)
Total CHOL/HDL Ratio: 3.4 (calc) (ref <5.0)
Triglycerides: 113 mg/dL (ref <150)

## 2020-01-11 LAB — HEPATIC FUNCTION PANEL
AG Ratio: 2.2 (calc) (ref 1.0–2.5)
ALT: 17 U/L (ref 6–29)
AST: 14 U/L (ref 10–35)
Albumin: 4.2 g/dL (ref 3.6–5.1)
Alkaline phosphatase (APISO): 56 U/L (ref 37–153)
Bilirubin, Direct: 0.1 mg/dL (ref 0.0–0.2)
Globulin: 1.9 g/dL (calc) (ref 1.9–3.7)
Indirect Bilirubin: 0.5 mg/dL (calc) (ref 0.2–1.2)
Total Bilirubin: 0.6 mg/dL (ref 0.2–1.2)
Total Protein: 6.1 g/dL (ref 6.1–8.1)

## 2020-01-11 LAB — TSH: TSH: 1.45 mIU/L (ref 0.40–4.50)

## 2020-01-16 NOTE — Progress Notes (Signed)
Resutls are in range or stable  Thyroid is in range  Blood sugar is normal

## 2020-01-23 ENCOUNTER — Other Ambulatory Visit: Payer: Self-pay

## 2020-01-23 ENCOUNTER — Ambulatory Visit (INDEPENDENT_AMBULATORY_CARE_PROVIDER_SITE_OTHER)
Admission: RE | Admit: 2020-01-23 | Discharge: 2020-01-23 | Disposition: A | Discharge status: Home / Self Care | Payer: Medicare Other | Source: Ambulatory Visit | Attending: Internal Medicine | Admitting: Internal Medicine

## 2020-01-23 DIAGNOSIS — E2839 Other primary ovarian failure: Secondary | ICD-10-CM | POA: Diagnosis not present

## 2020-01-24 NOTE — Telephone Encounter (Signed)
These look good. Tell her to stay on the current regimen

## 2020-02-14 NOTE — Telephone Encounter (Signed)
Hope you can feel better soon  I would wait until your blood pressure is in the 130 range and rising and you are feeling better, taking fluids okay.  Just restart the thyroid medicine when you feel like your stomach is settled and able to take solids and liquids

## 2020-02-14 NOTE — Progress Notes (Signed)
Bone density is decreased but not in the osteoporotic range.  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  Continue weightbearing exercise calcium vitamin D bone health.

## 2020-03-06 NOTE — Telephone Encounter (Signed)
These bp readings are good and at goal   So can stay on same dose  Of one a day . If stay in 120/80 range .  ( let us know if you see  Creeping up to above the 130/80s range   On an average basis  We can increase dose to 1.5 per day . )  (please correct the   Med list to say 1 qd of telmisartan hctz)  Thanks

## 2020-03-19 ENCOUNTER — Telehealth (INDEPENDENT_AMBULATORY_CARE_PROVIDER_SITE_OTHER): Payer: Medicare Other | Admitting: Internal Medicine

## 2020-03-19 ENCOUNTER — Other Ambulatory Visit: Payer: Self-pay

## 2020-03-19 ENCOUNTER — Encounter: Payer: Self-pay | Admitting: Internal Medicine

## 2020-03-19 VITALS — BP 124/79

## 2020-03-19 DIAGNOSIS — M949 Disorder of cartilage, unspecified: Secondary | ICD-10-CM

## 2020-03-19 DIAGNOSIS — M899 Disorder of bone, unspecified: Secondary | ICD-10-CM | POA: Diagnosis not present

## 2020-03-19 DIAGNOSIS — Z79899 Other long term (current) drug therapy: Secondary | ICD-10-CM

## 2020-03-19 DIAGNOSIS — M8580 Other specified disorders of bone density and structure, unspecified site: Secondary | ICD-10-CM

## 2020-03-19 DIAGNOSIS — I10 Essential (primary) hypertension: Secondary | ICD-10-CM

## 2020-03-19 NOTE — Progress Notes (Signed)
Virtual Visit via Video Note  I connected with@ on 03/19/20 at 11:00 AM EST by a video enabled telemedicine application and verified that I am speaking with the correct person using two identifiers. Location patient: home Location provider:work  office Persons participating in the virtual visit: patient, provider  [place] national recommendations  regarding COVID 19 pandemic   video visit is advised over in office visit for this patient.  Patient aware  of the limitations of evaluation and management by telemedicine and  availability of in person appointments. and agreed to proceed.   HPI: Cheryl Robbins presents for video visit follow-up of blood pressure readings and medication management.  The family had a serious GI illness that include vomiting diarrhea that lasted a number of days in the beginning of the year.  Covid negative.  She had stopped her blood pressure medication because she was not hydrated.  Since that time we have been monitoring blood pressure readings and started pills back January 5 taking 1 telmisartan HCTZ a day.  Without noted side effects She did lose 5 pounds during the illness.  She feels fine now Blood pressure on January 22 was 105/81 and 112/74 today 124/79 121/74 115/81. Denies side effects of medicines at this time. Working hard on not gaining back the 5 pounds.   No chest pain shortness of breath syncope dizziness. ROS: See pertinent positives and negatives per HPI.  Past Medical History:  Diagnosis Date  . Allergy   . GERD (gastroesophageal reflux disease)   . Headache(784.0)   . Hyperlipidemia   . Hypertension   . Low back pain   . Osteopenia   . 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 Primiparous   . Rosacea     Past Surgical History:  Procedure Laterality Date  . atypical moles    . 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 CATARACT EXTRACTION      Both eyes  . COLONOSCOPY    . EYE SURGERY    . OOPHORECTOMY    . tonsilletomy  1953    Family History  Problem Relation Age of Onset  . Atrial fibrillation Mother        had cv in her 78 s  . Arthritis Mother        had staph infection knee replacment  . Cancer Sister        thryoid  . Fibromyalgia Brother   . Colon cancer Neg Hx   . Rectal cancer Neg Hx   . Stomach cancer Neg Hx     Social History   Tobacco Use  . Smoking status: Never Smoker  . Smokeless tobacco: Never Used  Vaping Use  . Vaping Use: Never used  Substance Use Topics  . Alcohol use: No  . Drug use: No      Current Outpatient Medications:  .  acetaminophen (TYLENOL) 500 MG tablet, Take 1,000 mg by mouth every 6 (six) hours as needed., Disp: , Rfl:  .  atorvastatin (LIPITOR) 10 MG tablet, TAKE 1 TABLET BY MOUTH EVERY DAY, Disp: 90 tablet, Rfl: 0 .  beta carotene w/minerals (OCUVITE) tablet, Take 1 tablet by mouth daily., Disp: , Rfl:  .  Calcium Carbonate-Vitamin D 600-400 MG-UNIT tablet, Take 1 tablet by mouth daily., Disp: , Rfl:  .  co-enzyme Q-10 50 MG capsule, Take 150 mg by mouth daily., Disp: , Rfl:  .  EPINEPHrine 0.3 mg/0.3 mL IJ SOAJ injection, USE AS DIRECTED AS NEEDED, Disp: , Rfl:  .  fluticasone (FLONASE) 50 MCG/ACT nasal spray, Place into both nostrils daily., Disp: , Rfl:  .  guaiFENesin (MUCINEX) 600 MG 12 hr tablet, Take by mouth 2 (two) times daily., Disp: , Rfl:  .  ibuprofen (ADVIL,MOTRIN) 200 MG tablet, Take 200 mg by mouth every 6 (six) hours as needed., Disp: , Rfl:  .  levothyroxine (SYNTHROID) 25 MCG tablet, TAKE 1 TABLET BY MOUTH EVERY DAY BEFORE BREAKFAST, Disp: 90 tablet, Rfl: 0 .  loratadine (CLARITIN) 10 MG tablet, Take 10 mg by mouth as needed., Disp: , Rfl:  .  telmisartan-hydrochlorothiazide (MICARDIS HCT) 40-12.5 MG tablet, Take 1 tablet by mouth daily., Disp: , Rfl:  .  Turmeric 500 MG TABS, Take by mouth., Disp: , Rfl:  .  diphenhydrAMINE (BENADRYL) 25 MG tablet,  Take 25 mg by mouth every 6 (six) hours as needed.  (Patient not taking: Reported on 03/19/2020), Disp: , Rfl:  .  ondansetron (ZOFRAN-ODT) 4 MG disintegrating tablet, Take 1 tablet (4 mg total) by mouth every 8 (eight) hours as needed for nausea or vomiting (migraine). (Patient not taking: Reported on 03/19/2020), Disp: 15 tablet, Rfl: 1  EXAM: BP Readings from Last 3 Encounters:  03/19/20 124/79  01/10/20 (!) 156/94  08/02/19 125/74    VITALS per patient if applicable:  GENERAL: alert, oriented, appears well and in no acute distress  HEENT: atraumatic, conjunttiva clear, no obvious abnormalities on inspection of external nose and ears  NECK: normal movements of the head and neck  LUNGS: on inspection no signs of respiratory distress, breathing rate appears normal, no obvious gross SOB, gasping or wheezing  CV: no obvious cyanosis  MS: moves all visible extremities without noticeable abnormality  PSYCH/NEURO: pleasant and cooperative, no obvious depression or anxiety, speech and thought processing grossly intact Lab Results  Component Value Date   WBC 4.6 01/10/2020   HGB 14.5 01/10/2020   HCT 43.6 01/10/2020   PLT 191 01/10/2020   GLUCOSE 91 01/10/2020   CHOL 148 01/10/2020   TRIG 113 01/10/2020   HDL 44 (L) 01/10/2020   LDLDIRECT 171.0 06/17/2006   LDLCALC 83 01/10/2020   ALT 17 01/10/2020   AST 14 01/10/2020   NA 141 01/10/2020   K 4.2 01/10/2020   CL 101 01/10/2020   CREATININE 0.86 01/10/2020   BUN 16 01/10/2020   CO2 31 01/10/2020   TSH 1.45 01/10/2020   HGBA1C 5.3 10/16/2014    ASSESSMENT AND PLAN:  Discussed the following assessment and plan:    ICD-10-CM   1. Essential hypertension  I10   2. Medication management  Z79.899   3. Disorder of bone and cartilage  M89.9    M94.9   4. Osteopenia, unspecified location  M85.80    dexa -1.5    At this time stay on the telmisartan 4012.5 once a day.  Monitor intermittently if rising up into the 130 range we  can adjust medicine otherwise plan yearly visit in November or thereabouts. Discussed bone density she has mild osteopenia scoliosis noted certainly could be idiopathic or from the polio she had as a child. Sitter continuing weightbearing activities.  Adequate calcium vitamin D in diet. Counseled.   Expectant management and discussion of plan and treatment with opportunity to ask questions and all were answered. The patient agreed with the plan and demonstrated an understanding of the instructions.   Advised to call back or seek  an in-person evaluation if worsening  or having  further concerns . Return for In the fall October November for yearly check or as needed.    Shanon Ace, MD

## 2020-03-29 ENCOUNTER — Other Ambulatory Visit: Payer: Self-pay | Admitting: Internal Medicine

## 2020-03-29 DIAGNOSIS — E039 Hypothyroidism, unspecified: Secondary | ICD-10-CM

## 2020-04-16 LAB — HM MAMMOGRAPHY

## 2020-05-11 ENCOUNTER — Telehealth: Payer: Self-pay | Admitting: Internal Medicine

## 2020-05-11 NOTE — Telephone Encounter (Signed)
Refill was sent on 03/25

## 2020-05-11 NOTE — Telephone Encounter (Signed)
Pharmacy is calling in stating that the pt is out of her Rx telmisartan-hydrochlorothiazide (MICARDIS HCT) 40-12.5 MG.  Pharm: [facility].

## 2020-06-05 NOTE — Telephone Encounter (Signed)
Maybe.... sinusitis less than 10 days usually not advised  To add antibiotics this early unless fever and very severe  Advised saline perhaps Flonase allergy meds if allergic to pollen  and make a visit to discuss( we can add on as a virtual if needed.)

## 2020-06-08 ENCOUNTER — Other Ambulatory Visit: Payer: Self-pay | Admitting: Internal Medicine

## 2020-06-18 MED ORDER — ONDANSETRON 4 MG PO TBDP: 4.0000 mg | ORAL_TABLET | Freq: Three times a day (TID) | ORAL | 1 refills | Status: DC | PRN

## 2020-06-18 NOTE — Telephone Encounter (Signed)
I spoke with the patient and a virtual visit was scheduled for 05/03. Patient stated that she would like to try Zofran and would like for [facility].

## 2020-06-18 NOTE — Telephone Encounter (Signed)
See if she can do a video virtual visit tomorrow. If needed we can send in some Zofran if she is having vomiting Seek emergency room care if short of breath or feels like is getting too dehydrated.

## 2020-06-18 NOTE — Telephone Encounter (Signed)
Sent in electronically .  

## 2020-06-18 NOTE — Addendum Note (Signed)
Addended byShanon Ace K on: 06/18/2020 05:11 PM   Modules accepted: Orders

## 2020-06-19 ENCOUNTER — Telehealth (INDEPENDENT_AMBULATORY_CARE_PROVIDER_SITE_OTHER): Payer: Medicare Other | Admitting: Internal Medicine

## 2020-06-19 ENCOUNTER — Encounter: Payer: Self-pay | Admitting: Internal Medicine

## 2020-06-19 VITALS — Temp 99.6°F

## 2020-06-19 DIAGNOSIS — R509 Fever, unspecified: Secondary | ICD-10-CM

## 2020-06-19 DIAGNOSIS — J069 Acute upper respiratory infection, unspecified: Secondary | ICD-10-CM

## 2020-06-19 DIAGNOSIS — J989 Respiratory disorder, unspecified: Secondary | ICD-10-CM | POA: Diagnosis not present

## 2020-06-19 MED ORDER — PROMETHAZINE-DM 6.25-15 MG/5ML PO SYRP: 5.0000 mL | ORAL_SOLUTION | Freq: Four times a day (QID) | ORAL | 0 refills | Status: DC | PRN

## 2020-06-19 NOTE — Progress Notes (Signed)
Virtual Visit via Video Note  I connected with@ on 06/19/20 at  3:00 PM EDT by a video enabled telemedicine application and verified that I am speaking with the correct person using two identifiers. Location patient: home Location provider:work office Persons participating in the virtual visit: patient, provider  [place] national recommendations  regarding COVID 19 pandemic   video visit is advised over in office visit for this patient.  Patient aware  of the limitations of evaluation and management by telemedicine and  availability of in person appointments. and agreed to proceed.   HPI: Cheryl Robbins presents for video visit 4 days onset of fever some headache cough nasal congestion swollen glands that are better today. The 75-year-old got the illness first last week had a nasopharyngeal test that was negative for COVID and placed on steroids and one of the grandchildren was placed on a nebulizer for wheezing.  27-year-old had similar symptoms no antibiotics were given. Then she began having symptoms as above.  Some body aches headache but no sinus pain like sinusitis that she has had in the past.  See April says she was given [facility] and never took the antibiotic as not needed.  She does not feel this is a relapse of the previous but a new illness.     Cough productive of clear and thick   phlegm and mucus no hemoptysis Nausea  Better took dramamine helped too sick to get to pharmacy to pick up the Zofran. Cannot take codeine type cough medicines. ROS: See pertinent positives and negatives per HPI.  Past Medical History:  Diagnosis Date  . Allergy   . GERD (gastroesophageal reflux disease)   . Headache(784.0)   . Hyperlipidemia   . Hypertension   . Low back pain   . Osteopenia   . 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 Primiparous   . Rosacea     Past Surgical History:  Procedure Laterality Date  . atypical moles    . 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 CATARACT EXTRACTION     Both eyes  . COLONOSCOPY    . EYE SURGERY    . OOPHORECTOMY    . tonsilletomy  1953    Family History  Problem Relation Age of Onset  . Atrial fibrillation Mother        had cv in her 78 s  . Arthritis Mother        had staph infection knee replacment  . Cancer Sister        thryoid  . Fibromyalgia Brother   . Colon cancer Neg Hx   . Rectal cancer Neg Hx   . Stomach cancer Neg Hx     Social History   Tobacco Use  . Smoking status: Never Smoker  . Smokeless tobacco: Never Used  Vaping Use  . Vaping Use: Never used  Substance Use Topics  . Alcohol use: No  . Drug use: No      Current Outpatient Medications:  .  acetaminophen (TYLENOL) 500 MG tablet, Take 1,000 mg by mouth every 6 (six) hours as needed., Disp: , Rfl:  .  atorvastatin (LIPITOR) 10 MG tablet, TAKE 1 TABLET BY MOUTH EVERY DAY, Disp: 90 tablet, Rfl: 0 .  beta carotene w/minerals (OCUVITE) tablet, Take 1 tablet by mouth daily., Disp: , Rfl:  .  Calcium Carbonate-Vitamin D 600-400 MG-UNIT tablet,  Take 1 tablet by mouth daily., Disp: , Rfl:  .  co-enzyme Q-10 50 MG capsule, Take 150 mg by mouth daily., Disp: , Rfl:  .  diphenhydrAMINE (BENADRYL) 25 MG tablet, Take 25 mg by mouth every 6 (six) hours as needed., Disp: , Rfl:  .  EPINEPHrine 0.3 mg/0.3 mL IJ SOAJ injection, USE AS DIRECTED AS NEEDED, Disp: , Rfl:  .  fluticasone (FLONASE) 50 MCG/ACT nasal spray, Place into both nostrils daily., Disp: , Rfl:  .  guaiFENesin (MUCINEX) 600 MG 12 hr tablet, Take by mouth 2 (two) times daily., Disp: , Rfl:  .  ibuprofen (ADVIL,MOTRIN) 200 MG tablet, Take 200 mg by mouth every 6 (six) hours as needed., Disp: , Rfl:  .  levothyroxine (SYNTHROID) 25 MCG tablet, TAKE 1 TABLET BY MOUTH EVERY DAY BEFORE BREAKFAST, Disp: 90 tablet, Rfl: 0 .  loratadine (CLARITIN) 10 MG tablet, Take 10 mg by mouth as needed., Disp: , Rfl:   .  ondansetron (ZOFRAN-ODT) 4 MG disintegrating tablet, Take 1 tablet (4 mg total) by mouth every 8 (eight) hours as needed for nausea or vomiting (or migraine)., Disp: 15 tablet, Rfl: 1 .  promethazine-dextromethorphan (PROMETHAZINE-DM) 6.25-15 MG/5ML syrup, Take 5 mLs by mouth 4 (four) times daily as needed for cough., Disp: 118 mL, Rfl: 0 .  telmisartan-hydrochlorothiazide (MICARDIS HCT) 40-12.5 MG tablet, TAKE 1 TABLET BY MOUTH EVERY DAY, Disp: 90 tablet, Rfl: 0 .  Turmeric 500 MG TABS, Take by mouth., Disp: , Rfl:   EXAM: BP Readings from Last 3 Encounters:  03/19/20 124/79  01/10/20 (!) 156/94  08/02/19 125/74    VITALS per patient if applicable:  GENERAL: alert, oriented, appears well and in no acute distress occasional bronchial cough nontoxic-appearing  HEENT: atraumatic, conjunttiva clear, no obvious abnormalities on inspection of external nose and ears  NECK: normal movements of the head and neck  LUNGS: on inspection no signs of respiratory distress, breathing rate appears normal, no obvious gross SOB, gasping or wheezing  CV: no obvious cyanosis  MS: moves all visible extremities without noticeable abnormality  PSYCH/NEURO: pleasant and cooperative, no obvious depression or anxiety, speech and thought processing grossly intact Lab Results  Component Value Date   WBC 4.6 01/10/2020   HGB 14.5 01/10/2020   HCT 43.6 01/10/2020   PLT 191 01/10/2020   GLUCOSE 91 01/10/2020   CHOL 148 01/10/2020   TRIG 113 01/10/2020   HDL 44 (L) 01/10/2020   LDLDIRECT 171.0 06/17/2006   LDLCALC 83 01/10/2020   ALT 17 01/10/2020   AST 14 01/10/2020   NA 141 01/10/2020   K 4.2 01/10/2020   CL 101 01/10/2020   CREATININE 0.86 01/10/2020   BUN 16 01/10/2020   CO2 31 01/10/2020   TSH 1.45 01/10/2020   HGBA1C 5.3 10/16/2014    ASSESSMENT AND PLAN:  Discussed the following assessment and plan:    ICD-10-CM   1. Acute upper respiratory infection of multiple sites  J06.9   2.  Febrile respiratory illness  J98.9    R50.9    w cough    This sounds like a new viral respiratory infection that she contracted from her grandchildren based on history.  She has had fever for the last 2 to 3 days but it appears to be improving.  No other signs of pneumonia or complication. Close observation advised fluids hot liquids cough medicine sent to pharmacy if needed to be filled to be put on hold If fever is not gone in the  next 2 days contact us fifth for advice.  Signs of alarm relapsing pneumonia high fever chills shortness of breath reviewed to seek emergent or other care. Otherwise supportive to him. Counseled.   Expectant management and discussion of plan and treatment with opportunity to ask questions and all were answered. The patient agreed with the plan and demonstrated an understanding of the instructions.   Advised to call back or seek an in-person evaluation if worsening  or having  further concerns . Return if symptoms worsen or fail to improve as expected, for See text.    Shanon Ace, MD

## 2020-06-21 MED ORDER — DOXYCYCLINE HYCLATE 100 MG PO TABS: 100.0000 mg | ORAL_TABLET | Freq: Two times a day (BID) | ORAL | 0 refills | Status: DC

## 2020-06-21 NOTE — Telephone Encounter (Signed)
I am going to send in antibiotic in case  Not improving over the next  Day  Or   If you still have fever ( above 100.2tonight  begin  Antibiotic  Doxycycline  100 mg bid for 7 days this would treat early pneumonia and sinusitis  Bacteria. If present

## 2020-06-25 ENCOUNTER — Other Ambulatory Visit: Payer: Self-pay | Admitting: Internal Medicine

## 2020-06-25 DIAGNOSIS — E039 Hypothyroidism, unspecified: Secondary | ICD-10-CM

## 2020-10-24 ENCOUNTER — Ambulatory Visit: Payer: Medicare Other

## 2020-11-02 ENCOUNTER — Other Ambulatory Visit: Payer: Self-pay | Admitting: Internal Medicine

## 2020-11-19 ENCOUNTER — Other Ambulatory Visit: Payer: Self-pay

## 2020-11-19 ENCOUNTER — Ambulatory Visit (INDEPENDENT_AMBULATORY_CARE_PROVIDER_SITE_OTHER): Payer: Medicare Other

## 2020-11-19 VITALS — BP 124/64 | HR 70 | Temp 98.7°F | Ht 65.0 in | Wt 160.0 lb

## 2020-11-19 DIAGNOSIS — Z0000 Encounter for general adult medical examination without abnormal findings: Secondary | ICD-10-CM

## 2020-11-19 NOTE — Progress Notes (Addendum)
Subjective:   Cheryl Robbins is a 75 y.o. female who presents for Medicare Annual (Subsequent) preventive examination.  Review of Systems    N/a Cardiac Risk Factors include: advanced age (>20men, >76 women);dyslipidemia;hypertension     Objective:    Today's Vitals   11/19/20 1124  BP: 124/64  Pulse: 70  Temp: 98.7 F (37.1 C)  SpO2: 96%  Weight: 160 lb (72.6 kg)  Height: 5\' 5"  (1.651 m)   Body mass index is 26.63 kg/m.  Advanced Directives 11/19/2020 10/19/2019  Does Patient Have a Medical Advance Directive? Yes No  Type of Paramedic of [place];Living will -  Copy of [place] in Chart? No - copy requested -  Would patient like information on creating a medical advance directive? - No - Patient declined    Current Medications (verified) Outpatient Encounter Medications as of 11/19/2020  Medication Sig   acetaminophen (TYLENOL) 500 MG tablet Take 1,000 mg by mouth every 6 (six) hours as needed.   atorvastatin (LIPITOR) 10 MG tablet TAKE 1 TABLET BY MOUTH EVERY DAY   beta carotene w/minerals (OCUVITE) tablet Take 1 tablet by mouth daily.   Calcium Carbonate-Vitamin D 600-400 MG-UNIT tablet Take 1 tablet by mouth daily.   co-enzyme Q-10 50 MG capsule Take 150 mg by mouth daily.   diphenhydrAMINE (BENADRYL) 25 MG tablet Take 25 mg by mouth every 6 (six) hours as needed.   EPINEPHrine 0.3 mg/0.3 mL IJ SOAJ injection USE AS DIRECTED AS NEEDED   fluticasone (FLONASE) 50 MCG/ACT nasal spray Place into both nostrils daily.   guaiFENesin (MUCINEX) 600 MG 12 hr tablet Take by mouth 2 (two) times daily.   ibuprofen (ADVIL,MOTRIN) 200 MG tablet Take 200 mg by mouth every 6 (six) hours as needed.   levothyroxine (SYNTHROID) 25 MCG tablet TAKE 1 TABLET BY MOUTH EVERY DAY BEFORE BREAKFAST   loratadine (CLARITIN) 10 MG tablet Take 10 mg by mouth as needed.   ondansetron (ZOFRAN-ODT) 4 MG disintegrating tablet Take 1 tablet (4 mg total) by  mouth every 8 (eight) hours as needed for nausea or vomiting (or migraine).   telmisartan-hydrochlorothiazide (MICARDIS HCT) 40-12.5 MG tablet TAKE 1 TABLET BY MOUTH EVERY DAY   Turmeric 500 MG TABS Take by mouth.   doxycycline (VIBRA-TABS) 100 MG tablet Take 1 tablet (100 mg total) by mouth 2 (two) times daily. (Patient not taking: Reported on 11/19/2020)   promethazine-dextromethorphan (PROMETHAZINE-DM) 6.25-15 MG/5ML syrup Take 5 mLs by mouth 4 (four) times daily as needed for cough. (Patient not taking: Reported on 11/19/2020)   No facility-administered encounter medications on file as of 11/19/2020.    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   CATARACT EXTRACTION     Both eyes   COLONOSCOPY     EYE SURGERY     OOPHORECTOMY     tonsilletomy  1953   Family History  Problem Relation Age of Onset   Atrial fibrillation Mother        had cv in her 56 s   Arthritis Mother        had staph infection knee replacment   Cancer Sister        thryoid   Fibromyalgia Brother    Colon cancer Neg Hx    Rectal cancer Neg Hx    Stomach cancer Neg Hx    Social History   Socioeconomic History   Marital status: Unknown    Spouse name: Not on file   Number of children: Not on file   Years of education: Not on file   Highest education level: Not on file  Occupational History   Occupation: Retired  Tobacco Use   Smoking status: Never   Smokeless tobacco:  Never  Vaping Use   Vaping Use: Never used  Substance and Sexual Activity   Alcohol use: No   Drug use: No   Sexual activity: Not on file  Other Topics Concern   Not on file  Social History Narrative   hh of 1   No pets   No ets.   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      Mom age 12 friends home snif recently af TKR infection passed away 06/02/2015    Social Determinants of Health   Financial Resource Strain: Low Risk    Difficulty of Paying Living Expenses: Not hard at all  Food Insecurity: No Food Insecurity   Worried About Charity fundraiser in the Last Year: Never true   Arboriculturist in the Last Year: Never true  Transportation Needs: No Transportation Needs   Lack of Transportation (Medical): No   Lack of Transportation (Non-Medical): No  Physical Activity: Insufficiently Active   Days of Exercise per Week: 3 days   Minutes of Exercise per Session: 20 min  Stress: No Stress Concern Present   Feeling of Stress : Not at all  Social Connections: Socially Isolated   Frequency of Communication with Friends and Family: Twice a week   Frequency of Social Gatherings with Friends and Family: Twice a week   Attends Religious Services: Never   Marine scientist or Organizations: No   Attends Archivist Meetings: Never   Marital Status: Widowed    Tobacco Counseling Counseling given: Not Answered   Clinical Intake:  Pre-visit preparation completed: Yes  Pain : No/denies pain     Nutritional Risks: None Diabetes: No  How often do you need to have someone help you when you read instructions, pamphlets, or other written materials from your doctor or pharmacy?: 1 - Never What is the last grade level you completed in school?: college  Diabetic?no  Interpreter Needed?: No  Information entered by :: [place] of Daily Living In your present state of health, do you have  any difficulty performing the following activities: 11/19/2020  Hearing? N  Vision? N  Difficulty concentrating or making decisions? N  Walking or climbing stairs? N  Dressing or bathing? N  Doing errands, shopping? N  Preparing Food and eating ? N  Using the Toilet? N  In the past six months, have you accidently leaked urine? N  Do you have problems with loss of bowel control? N  Managing your Medications? N  Managing your Finances? N  Housekeeping or managing  your Housekeeping? N  Some recent data might be hidden    Patient Care Team: Panosh, Standley Brooking, MD as PCP - General Ernesto Rutherford Maudry Mayhew, MD (Otolaryngology) Allyn Kenner, MD (Dermatology) Latanya Maudlin, MD (Orthopedic Surgery) ed Eddie Dibbles (Ophthalmology) Tiajuana Amass, MD as Consulting Physician (Allergy and Immunology)  Indicate any recent Medical Services you may have received from other than [facility] providers in the past year (date may be approximate).     Assessment:   This is a routine wellness examination for Cheryl Robbins.  Hearing/Vision screen Vision Screening - Comments:: Annual eye exams wears glasses   Dietary issues and exercise activities discussed: Current Exercise Habits: Home exercise routine, Type of exercise: walking, Time (Minutes): 20, Frequency (Times/Week): 3, Weekly Exercise (Minutes/Week): 60, Intensity: Mild, Exercise limited by: None identified   Goals Addressed             This Visit's Progress    Patient Stated   On track    Stay healthy       Depression Screen PHQ 2/9 Scores 11/19/2020 01/10/2020 01/10/2020 10/19/2019 12/22/2018 09/07/2017 04/28/2016  PHQ - 2 Score 0 0 0 0 0 0 1  PHQ- 9 Score - 0 - - - - -    Fall Risk Fall Risk  11/19/2020 01/10/2020 10/19/2019 12/22/2018 09/07/2017  Falls in the past year? 0 0 0 0 No  Number falls in past yr: 0 - 0 0 -  Injury with Fall? 0 - 0 0 -  Risk for fall due to : - - Impaired vision - -  Follow up Falls evaluation completed - Falls prevention discussed  Falls evaluation completed -    FALL RISK PREVENTION PERTAINING TO THE HOME:  Any stairs in or around the home? Yes  If so, are there any without handrails? No  Home free of loose throw rugs in walkways, pet beds, electrical cords, etc? Yes  Adequate lighting in your home to reduce risk of falls? Yes   ASSISTIVE DEVICES UTILIZED TO PREVENT FALLS:  Life alert? No  Use of a cane, walker or w/c? No  Grab bars in the bathroom? No  Shower chair or bench in shower? No  Elevated toilet seat or a handicapped toilet? No   TIMED UP AND GO:  Was the test performed? Yes .  Length of time to ambulate 10 feet: 7 sec.   Gait steady and fast without use of assistive device  Cognitive Function:    Normal cognitive status assessed by direct observation by this Nurse Health Advisor. No abnormalities found.   6CIT Screen 10/19/2019  What Year? 0 points  What month? 0 points  Count back from 20 0 points  Months in reverse 0 points  Repeat phrase 0 points    Immunizations Immunization History  Administered Date(s) Administered   Fluad Quad(high Dose 65+) 11/20/2018   Influenza Split 11/26/2010, 12/05/2011   Influenza Whole 11/27/2006, 12/01/2007, 11/29/2008   Influenza, High Dose Seasonal PF 12/06/2013, 11/06/2014, 11/25/2015, 11/22/2016, 11/16/2017   Influenza,inj,Quad PF,6+ Mos 10/21/2012   Influenza-Unspecified 11/22/2016, 11/25/2019   PFIZER(Purple Top)SARS-COV-2 Vaccination 03/27/2019, 04/20/2019, 12/06/2019   Pneumococcal Conjugate-13 06/24/2013   Pneumococcal Polysaccharide-23 12/04/2009   Td 07/30/2006   Tdap 08/22/2015   Zoster Recombinat (Shingrix) 01/03/2017, 03/10/2017   Zoster, Live 02/19/2009    TDAP status: Up to date  Flu Vaccine status: Up to date  Pneumococcal vaccine status: Up to date  Covid-19 vaccine status: Completed vaccines  Qualifies for Shingles Vaccine? Yes   Zostavax completed Yes  Shingrix Completed?: Yes  Screening Tests Health Maintenance   Topic Date Due   COVID-19 Vaccine (5 - Booster for Pfizer series) 04/07/2020   INFLUENZA VACCINE  09/17/2020   TETANUS/TDAP  08/21/2025   DEXA SCAN  Completed   Hepatitis C Screening  Completed   Zoster Vaccines- Shingrix  Completed   HPV VACCINES  Aged Out    Health Maintenance  Health Maintenance Due  Topic Date Due   COVID-19 Vaccine (5 - Booster for Pfizer series) 04/07/2020   INFLUENZA VACCINE  09/17/2020    Colorectal cancer screening: No longer required.   Mammogram status: No longer required due to age.  Bone Density status: Completed 01/23/2020. Results reflect: Bone density results: OSTEOPENIA. Repeat every 5 years.  Lung Cancer Screening: (Low Dose CT Chest recommended if Age 44-80 years, 30 pack-year currently smoking OR have quit w/in 15years.) does not qualify.   Lung Cancer Screening Referral: n/a  Additional Screening:  Hepatitis C Screening: does not qualify; Completed 08/22/2015  Vision Screening: Recommended annual ophthalmology exams for early detection of glaucoma and other disorders of the eye. Is the patient up to date with their annual eye exam?  Yes  Who is the provider or what is the name of the office in which the patient attends annual eye exams? Dr. Ellie Lunch  If pt is not established with a provider, would they like to be referred to a provider to establish care? No .   Dental Screening: Recommended annual dental exams for proper oral hygiene  Community Resource Referral / Chronic Care Management: CRR required this visit?  No   CCM required this visit?  No      Plan:     I have personally reviewed and noted the following in the patient's chart:   Medical and social history Use of alcohol, tobacco or illicit drugs  Current medications and supplements including opioid prescriptions.  Functional ability and status Nutritional status Physical activity Advanced directives List of other physicians Hospitalizations, surgeries, and ER  visits in previous 12 months Vitals Screenings to include cognitive, depression, and falls Referrals and appointments  In addition, I have reviewed and discussed with patient certain preventive protocols, quality metrics, and best practice recommendations. A written personalized care plan for preventive services as well as general preventive health recommendations were provided to patient.     Randel Pigg, LPN   [id number]   Nurse Notes: none

## 2020-11-19 NOTE — Patient Instructions (Signed)
Cheryl Robbins , Thank you for taking time to come for your Medicare Wellness Visit. I appreciate your ongoing commitment to your health goals. Please review the following plan we discussed and let me know if I can assist you in the future.   Screening recommendations/referrals: Colonoscopy: no longer required  Mammogram: 04/17/2019 Bone Density: 01/23/2016 Recommended yearly ophthalmology/optometry visit for glaucoma screening and checkup Recommended yearly dental visit for hygiene and checkup  Vaccinations: Influenza vaccine: due in fall 2022  Pneumococcal vaccine: completed series  Tdap vaccine: 08/22/2015 Shingles vaccine: completed series     Advanced directives: will provide copies   Conditions/risks identified: none   Next appointment: 12/24/2020  200pm  Dr.Panosh    Preventive Care 76 Years and Older, Female Preventive care refers to lifestyle choices and visits with your health care provider that can promote health and wellness. What does preventive care include? A yearly physical exam. This is also called an annual well check. Dental exams once or twice a year. Routine eye exams. Ask your health care provider how often you should have your eyes checked. Personal lifestyle choices, including: Daily care of your teeth and gums. Regular physical activity. Eating a healthy diet. Avoiding tobacco and drug use. Limiting alcohol use. Practicing safe sex. Taking low-dose aspirin every day. Taking vitamin and mineral supplements as recommended by your health care provider. What happens during an annual well check? The services and screenings done by your health care provider during your annual well check will depend on your age, overall health, lifestyle risk factors, and family history of disease. Counseling  Your health care provider may ask you questions about your: Alcohol use. Tobacco use. Drug use. Emotional well-being. Home and relationship well-being. Sexual  activity. Eating habits. History of falls. Memory and ability to understand (cognition). Work and work Statistician. Reproductive health. Screening  You may have the following tests or measurements: Height, weight, and BMI. Blood pressure. Lipid and cholesterol levels. These may be checked every 5 years, or more frequently if you are over 84 years old. Skin check. Lung cancer screening. You may have this screening every year starting at age 62 if you have a 30-pack-year history of smoking and currently smoke or have quit within the past 15 years. Fecal occult blood test (FOBT) of the stool. You may have this test every year starting at age 70. Flexible sigmoidoscopy or colonoscopy. You may have a sigmoidoscopy every 5 years or a colonoscopy every 10 years starting at age 93. Hepatitis C blood test. Hepatitis B blood test. Sexually transmitted disease (STD) testing. Diabetes screening. This is done by checking your blood sugar (glucose) after you have not eaten for a while (fasting). You may have this done every 1-3 years. Bone density scan. This is done to screen for osteoporosis. You may have this done starting at age 34. Mammogram. This may be done every 1-2 years. Talk to your health care provider about how often you should have regular mammograms. Talk with your health care provider about your test results, treatment options, and if necessary, the need for more tests. Vaccines  Your health care provider may recommend certain vaccines, such as: Influenza vaccine. This is recommended every year. Tetanus, diphtheria, and acellular pertussis (Tdap, Td) vaccine. You may need a Td booster every 10 years. Zoster vaccine. You may need this after age 64. Pneumococcal 13-valent conjugate (PCV13) vaccine. One dose is recommended after age 78. Pneumococcal polysaccharide (PPSV23) vaccine. One dose is recommended after age 47. Talk to your  health care provider about which screenings and vaccines  you need and how often you need them. This information is not intended to replace advice given to you by your health care provider. Make sure you discuss any questions you have with your health care provider. Document Released: 03/02/2015 Document Revised: 10/24/2015 Document Reviewed: 12/05/2014 Elsevier Interactive Patient Education  2017 [place] Prevention in the Home Falls can cause injuries. They can happen to people of all ages. There are many things you can do to make your home safe and to help prevent falls. What can I do on the outside of my home? Regularly fix the edges of walkways and driveways and fix any cracks. Remove anything that might make you trip as you walk through a door, such as a raised step or threshold. Trim any bushes or trees on the path to your home. Use bright outdoor lighting. Clear any walking paths of anything that might make someone trip, such as rocks or tools. Regularly check to see if handrails are loose or broken. Make sure that both sides of any steps have handrails. Any raised decks and porches should have guardrails on the edges. Have any leaves, snow, or ice cleared regularly. Use sand or salt on walking paths during winter. Clean up any spills in your garage right away. This includes oil or grease spills. What can I do in the bathroom? Use night lights. Install grab bars by the toilet and in the tub and shower. Do not use towel bars as grab bars. Use non-skid mats or decals in the tub or shower. If you need to sit down in the shower, use a plastic, non-slip stool. Keep the floor dry. Clean up any water that spills on the floor as soon as it happens. Remove soap buildup in the tub or shower regularly. Attach bath mats securely with double-sided non-slip rug tape. Do not have throw rugs and other things on the floor that can make you trip. What can I do in the bedroom? Use night lights. Make sure that you have a light by your bed that  is easy to reach. Do not use any sheets or blankets that are too big for your bed. They should not hang down onto the floor. Have a firm chair that has side arms. You can use this for support while you get dressed. Do not have throw rugs and other things on the floor that can make you trip. What can I do in the kitchen? Clean up any spills right away. Avoid walking on wet floors. Keep items that you use a lot in easy-to-reach places. If you need to reach something above you, use a strong step stool that has a grab bar. Keep electrical cords out of the way. Do not use floor polish or wax that makes floors slippery. If you must use wax, use non-skid floor wax. Do not have throw rugs and other things on the floor that can make you trip. What can I do with my stairs? Do not leave any items on the stairs. Make sure that there are handrails on both sides of the stairs and use them. Fix handrails that are broken or loose. Make sure that handrails are as long as the stairways. Check any carpeting to make sure that it is firmly attached to the stairs. Fix any carpet that is loose or worn. Avoid having throw rugs at the top or bottom of the stairs. If you do have throw rugs, attach them  to the floor with carpet tape. Make sure that you have a light switch at the top of the stairs and the bottom of the stairs. If you do not have them, ask someone to add them for you. What else can I do to help prevent falls? Wear shoes that: Do not have high heels. Have rubber bottoms. Are comfortable and fit you well. Are closed at the toe. Do not wear sandals. If you use a stepladder: Make sure that it is fully opened. Do not climb a closed stepladder. Make sure that both sides of the stepladder are locked into place. Ask someone to hold it for you, if possible. Clearly mark and make sure that you can see: Any grab bars or handrails. First and last steps. Where the edge of each step is. Use tools that help you  move around (mobility aids) if they are needed. These include: Canes. Walkers. Scooters. Crutches. Turn on the lights when you go into a dark area. Replace any light bulbs as soon as they burn out. Set up your furniture so you have a clear path. Avoid moving your furniture around. If any of your floors are uneven, fix them. If there are any pets around you, be aware of where they are. Review your medicines with your doctor. Some medicines can make you feel dizzy. This can increase your chance of falling. Ask your doctor what other things that you can do to help prevent falls. This information is not intended to replace advice given to you by your health care provider. Make sure you discuss any questions you have with your health care provider. Document Released: 11/30/2008 Document Revised: 07/12/2015 Document Reviewed: 03/10/2014 Elsevier Interactive Patient Education  2017 [employer].

## 2020-12-24 ENCOUNTER — Other Ambulatory Visit: Payer: Self-pay | Admitting: Internal Medicine

## 2020-12-24 ENCOUNTER — Encounter: Payer: Self-pay | Admitting: Internal Medicine

## 2020-12-24 ENCOUNTER — Other Ambulatory Visit: Payer: Self-pay

## 2020-12-24 ENCOUNTER — Ambulatory Visit: Payer: Medicare Other | Admitting: Internal Medicine

## 2020-12-24 VITALS — BP 148/90 | HR 85 | Temp 98.4°F | Ht 65.0 in | Wt 160.2 lb

## 2020-12-24 DIAGNOSIS — I10 Essential (primary) hypertension: Secondary | ICD-10-CM

## 2020-12-24 DIAGNOSIS — Z79899 Other long term (current) drug therapy: Secondary | ICD-10-CM

## 2020-12-24 DIAGNOSIS — E785 Hyperlipidemia, unspecified: Secondary | ICD-10-CM

## 2020-12-24 DIAGNOSIS — R922 Inconclusive mammogram: Secondary | ICD-10-CM | POA: Diagnosis not present

## 2020-12-24 DIAGNOSIS — Z8612 Personal history of poliomyelitis: Secondary | ICD-10-CM

## 2020-12-24 DIAGNOSIS — E039 Hypothyroidism, unspecified: Secondary | ICD-10-CM

## 2020-12-24 DIAGNOSIS — N644 Mastodynia: Secondary | ICD-10-CM

## 2020-12-24 NOTE — Progress Notes (Signed)
Chief Complaint  Patient presents with   Breast Pain    Patient complains of right breast pain, x3 months, Patient denies any known injury,     HPI: Cheryl Robbins 75 y.o. come in for   concern  for sx  .Twinge transient  pain in breast  and not sure  both sides for a few months . No dc  no mass felt  known to have dense breast   last mammo Feb 28 22  Cares  some 2 grandchilds   ? Of  elbows and ? If was hit.  Or something else   No discharge and not constant   nor daily .  ? If from too much tea caffiene  so limited     More sx  on right side .  Right handed .  Has dense breasts   Under rx for allergy  last visit 5 video May 22  BP  mostly at range 120 range a few 140 range   taking one  per day candahctz  Has cpx end of November  ROS: See pertinent positives and negatives per HPI.  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   Primiparous    Rosacea     Family History  Problem Relation Age of Onset   Atrial fibrillation Mother        had cv in her 42 s   Arthritis Mother        had staph infection knee replacment   Cancer Sister        thryoid   Fibromyalgia Brother    Colon cancer Neg Hx    Rectal cancer Neg Hx    Stomach cancer Neg Hx     Social History   Socioeconomic History   Marital status: Unknown    Spouse name: Not on file   Number of children: Not on file   Years of education: Not on file   Highest education level: Not on file  Occupational History   Occupation: Retired  Tobacco Use   Smoking status: Never   Smokeless tobacco: Never  Vaping Use   Vaping Use: Never used  Substance and Sexual Activity   Alcohol use: No   Drug use: No   Sexual activity: Not on file  Other Topics Concern   Not on file  Social History  Narrative   hh of 1   No pets   No ets.   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      Mom age 52 friends home snif recently af TKR infection passed away 2015-05-05    Social Determinants of Health   Financial Resource Strain: Low Risk    Difficulty of Paying Living Expenses: Not hard at all  Food Insecurity: No Food Insecurity   Worried About Charity fundraiser in the Last Year: Never true   Arboriculturist in the Last Year: Never true  Transportation Needs: No Transportation Needs   Lack of Transportation (Medical): No   Lack of Transportation (Non-Medical): No  Physical Activity: Insufficiently Active   Days of Exercise per Week: 3 days  Minutes of Exercise per Session: 20 min  Stress: No Stress Concern Present   Feeling of Stress : Not at all  Social Connections: Socially Isolated   Frequency of Communication with Friends and Family: Twice a week   Frequency of Social Gatherings with Friends and Family: Twice a week   Attends Religious Services: Never   Marine scientist or Organizations: No   Attends Archivist Meetings: Never   Marital Status: Widowed    Outpatient Medications Prior to Visit  Medication Sig Dispense Refill   acetaminophen (TYLENOL) 500 MG tablet Take 1,000 mg by mouth every 6 (six) hours as needed.     atorvastatin (LIPITOR) 10 MG tablet TAKE 1 TABLET BY MOUTH EVERY DAY 90 tablet 1   beta carotene w/minerals (OCUVITE) tablet Take 1 tablet by mouth daily.     Calcium Carbonate-Vitamin D 600-400 MG-UNIT tablet Take 1 tablet by mouth daily.     co-enzyme Q-10 50 MG capsule Take 150 mg by mouth daily.     diphenhydrAMINE (BENADRYL) 25 MG tablet Take 25 mg by mouth every 6 (six) hours as needed.     doxycycline (VIBRA-TABS) 100 MG tablet Take 1 tablet (100 mg total) by mouth 2 (two) times daily. 14 tablet 0   EPINEPHrine 0.3 mg/0.3 mL IJ SOAJ injection USE AS DIRECTED AS NEEDED      fluticasone (FLONASE) 50 MCG/ACT nasal spray Place into both nostrils daily.     guaiFENesin (MUCINEX) 600 MG 12 hr tablet Take by mouth 2 (two) times daily.     ibuprofen (ADVIL,MOTRIN) 200 MG tablet Take 200 mg by mouth every 6 (six) hours as needed.     levothyroxine (SYNTHROID) 25 MCG tablet TAKE 1 TABLET BY MOUTH EVERY DAY BEFORE BREAKFAST 90 tablet 1   loratadine (CLARITIN) 10 MG tablet Take 10 mg by mouth as needed.     ondansetron (ZOFRAN-ODT) 4 MG disintegrating tablet Take 1 tablet (4 mg total) by mouth every 8 (eight) hours as needed for nausea or vomiting (or migraine). 15 tablet 1   promethazine-dextromethorphan (PROMETHAZINE-DM) 6.25-15 MG/5ML syrup Take 5 mLs by mouth 4 (four) times daily as needed for cough. 118 mL 0   telmisartan-hydrochlorothiazide (MICARDIS HCT) 40-12.5 MG tablet TAKE 1 TABLET BY MOUTH EVERY DAY 90 tablet 0   Turmeric 500 MG TABS Take by mouth.     No facility-administered medications prior to visit.     EXAM:  BP (!) 148/90 (BP Location: Left Arm, Patient Position: Sitting, Cuff Size: Normal)   Pulse 85   Temp 98.4 F (36.9 C) (Oral)   Ht 5\' 5"  (1.651 m)   Wt 160 lb 3.2 oz (72.7 kg)   SpO2 97%   BMI 26.66 kg/m   Body mass index is 26.66 kg/m.  GENERAL: vitals reviewed and listed above, alert, oriented, appears well hydrated and in no acute distress HEENT: atraumatic, conjunctiva  clear, no obvious abnormalities on inspection of external nose and ears OP :  NECK: no obvious masses on inspection palpation  Breast: normal by inspection . No dimpling, discharge, masses, tenderness or discharge .axilla clear  CV: HRRR, no clubbing cyanosis or  peripheral edema nl cap refill  MS: moves all extremities without noticeable focal  abnormality PSYCH: pleasant and cooperative, no obvious depression or anxiety Lab Results  Component Value Date   WBC 4.6 01/10/2020   HGB 14.5 01/10/2020   HCT 43.6 01/10/2020   PLT 191 01/10/2020   GLUCOSE 91 01/10/2020  CHOL 148 01/10/2020   TRIG 113 01/10/2020   HDL 44 (L) 01/10/2020   LDLDIRECT 171.0 06/17/2006   LDLCALC 83 01/10/2020   ALT 17 01/10/2020   AST 14 01/10/2020   NA 141 01/10/2020   K 4.2 01/10/2020   CL 101 01/10/2020   CREATININE 0.86 01/10/2020   BUN 16 01/10/2020   CO2 31 01/10/2020   TSH 1.45 01/10/2020   HGBA1C 5.3 10/16/2014   BP Readings from Last 3 Encounters:  12/24/20 (!) 148/90  11/19/20 124/64  03/19/20 124/79    ASSESSMENT AND PLAN:  Discussed the following assessment and plan:  Breast pain  Dense breast tissue on mammogram  Essential hypertension - seems controlled out of office  May be cw    Send orders  diagnostic mammo  order at [place] . Can get labs pre  cpx end of November.  -Patient advised to return or notify health care team  if  new concerns arise. Review plan  orders and fu plan  32 minutes  Patient Instructions   Will order  diagnostic mammogram  sent to [facility]  . Exam is reassuring. Looks like your BP is controlled out of office .   Standley Brooking. Tareva Leske M.D.

## 2020-12-24 NOTE — Patient Instructions (Addendum)
  Will order  diagnostic mammogram  sent to [facility]  . Exam is reassuring. Looks like your BP is controlled out of office .

## 2020-12-24 NOTE — Progress Notes (Signed)
Orders for upcoming  cpx and med evaluation

## 2020-12-27 ENCOUNTER — Other Ambulatory Visit: Payer: Self-pay | Admitting: Internal Medicine

## 2020-12-27 DIAGNOSIS — E039 Hypothyroidism, unspecified: Secondary | ICD-10-CM

## 2021-01-08 ENCOUNTER — Other Ambulatory Visit (INDEPENDENT_AMBULATORY_CARE_PROVIDER_SITE_OTHER): Payer: Medicare Other

## 2021-01-08 DIAGNOSIS — Z79899 Other long term (current) drug therapy: Secondary | ICD-10-CM

## 2021-01-08 DIAGNOSIS — Z8612 Personal history of poliomyelitis: Secondary | ICD-10-CM | POA: Diagnosis not present

## 2021-01-08 DIAGNOSIS — E039 Hypothyroidism, unspecified: Secondary | ICD-10-CM

## 2021-01-08 DIAGNOSIS — I10 Essential (primary) hypertension: Secondary | ICD-10-CM

## 2021-01-08 DIAGNOSIS — E785 Hyperlipidemia, unspecified: Secondary | ICD-10-CM | POA: Diagnosis not present

## 2021-01-08 LAB — BASIC METABOLIC PANEL
BUN: 16 mg/dL (ref 6–23)
CO2: 32 mEq/L (ref 19–32)
Calcium: 9.2 mg/dL (ref 8.4–10.5)
Chloride: 99 mEq/L (ref 96–112)
Creatinine, Ser: 0.85 mg/dL (ref 0.40–1.20)
GFR: 66.61 mL/min (ref >60.00)
Glucose, Bld: 108 mg/dL — ABNORMAL HIGH (ref 70–99)
Potassium: 4.2 mEq/L (ref 3.5–5.1)
Sodium: 137 mEq/L (ref 135–145)

## 2021-01-08 LAB — HEPATIC FUNCTION PANEL
ALT: 19 U/L (ref 0–35)
AST: 17 U/L (ref 0–37)
Albumin: 4.1 g/dL (ref 3.5–5.2)
Alkaline Phosphatase: 56 U/L (ref 39–117)
Bilirubin, Direct: 0.1 mg/dL (ref 0.0–0.3)
Total Bilirubin: 0.6 mg/dL (ref 0.2–1.2)
Total Protein: 6.1 g/dL (ref 6.0–8.3)

## 2021-01-08 LAB — CBC WITH DIFFERENTIAL/PLATELET
Basophils Absolute: 0 10*3/uL (ref 0.0–0.1)
Basophils Relative: 0.4 % (ref 0.0–3.0)
Eosinophils Absolute: 0.8 10*3/uL — ABNORMAL HIGH (ref 0.0–0.7)
Eosinophils Relative: 14.1 % — ABNORMAL HIGH (ref 0.0–5.0)
HCT: 43.1 % (ref 36.0–46.0)
Hemoglobin: 14.4 g/dL (ref 12.0–15.0)
Lymphocytes Relative: 23.9 % (ref 12.0–46.0)
Lymphs Abs: 1.3 10*3/uL (ref 0.7–4.0)
MCHC: 33.3 g/dL (ref 30.0–36.0)
MCV: 84.2 fl (ref 78.0–100.0)
Monocytes Absolute: 0.4 10*3/uL (ref 0.1–1.0)
Monocytes Relative: 6.6 % (ref 3.0–12.0)
Neutro Abs: 2.9 10*3/uL (ref 1.4–7.7)
Neutrophils Relative %: 55 % (ref 43.0–77.0)
Platelets: 176 10*3/uL (ref 150.0–400.0)
RBC: 5.12 Mil/uL — ABNORMAL HIGH (ref 3.87–5.11)
RDW: 13.8 % (ref 11.5–15.5)
WBC: 5.3 10*3/uL (ref 4.0–10.5)

## 2021-01-08 LAB — LIPID PANEL
Cholesterol: 147 mg/dL (ref 0–200)
HDL: 40 mg/dL (ref >39.00)
LDL Cholesterol: 81 mg/dL (ref 0–99)
NonHDL: 106.64
Total CHOL/HDL Ratio: 4
Triglycerides: 129 mg/dL (ref 0.0–149.0)
VLDL: 25.8 mg/dL (ref 0.0–40.0)

## 2021-01-08 LAB — TSH: TSH: 3 u[IU]/mL (ref 0.35–5.50)

## 2021-01-13 NOTE — Progress Notes (Signed)
Chief Complaint  Patient presents with   Annual Exam     HPI: Cheryl Robbins 75 y.o. comes in today for Preventive Medicare exam/ wellness visit  and  med chedks  .Since last visit.  Battling with back pain with certain bending and lifting otherwise doing pretty well  Thyroid meds every day no concerned BP blood pressure brings in log in the last month averages in the 120s over 80s range and pulse in the 70s.  Taking Micardis HCTZ no concerns. Hld taking lipitor  Taking allergy shots Noted on blood work increased eosinophils this is her peak season for allergies. Getting next imaging this week call back no symptoms   Health Maintenance  Topic Date Due   COVID-19 Vaccine (5 - Booster for Pfizer series) 01/31/2020   TETANUS/TDAP  08/21/2025   Pneumonia Vaccine 71+ Years old  Completed   INFLUENZA VACCINE  Completed   DEXA SCAN  Completed   Hepatitis C Screening  Completed   Zoster Vaccines- Shingrix  Completed   HPV VACCINES  Aged Out   COLONOSCOPY (Pts 45-8yrs Insurance coverage will need to be confirmed)  Discontinued   Health Maintenance Review LIFESTYLE:  Exercise:  childcare   walking  ave 1 mi 3-4 x per week.  Tobacco/ETS:n Alcohol: n Sugar beverages: ocass rare  Sleep: ave  7-8  Drug use: no HH: 1   no pets  raliegh grand children    Hearing: ok may be less than when younger   Vision:  No limitations at present . Last eye check UTDyearly  Safety:  Has smoke detector and wears seat belts.No excess sun exposure. Sees dentist regularly. Memory: Felt to be good  slower , no concern from her or her family. Depression: No anhedonia unusual crying or depressive symptoms Nutrition: Eats well balanced diet; adequate calcium and vitamin D. No swallowing chewing problems. clatrate Injury: no major injuries in the last six months. Other healthcare providers:  Reviewed today . Preventive parameters: up-to-date  Reviewed  ADLS:   There are no problems or need for  assistance  driving, feeding, obtaining food, dressing, toileting and bathing, managing money using phone. She is independent.    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   Primiparous    Rosacea     Family History  Problem Relation Age of Onset   Atrial fibrillation Mother        had cv in her 74 s   Arthritis Mother        had staph infection knee replacment   Cancer Sister        thryoid   Fibromyalgia Brother    Colon cancer Neg Hx    Rectal cancer Neg Hx    Stomach cancer Neg Hx     Social History   Socioeconomic History   Marital status: Unknown    Spouse name: Not on file   Number of children: Not on file   Years of education: Not on file   Highest education level: Not on file  Occupational History   Occupation: Retired  Tobacco Use   Smoking  status: Never   Smokeless tobacco: Never  Vaping Use   Vaping Use: Never used  Substance and Sexual Activity   Alcohol use: No   Drug use: No   Sexual activity: Not on file  Other Topics Concern   Not on file  Social History Narrative   hh of 1   No pets   No ets.   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      Mom age 20 friends home snif recently af TKR infection passed away May 25, 2015    Social Determinants of Health   Financial Resource Strain: Low Risk    Difficulty of Paying Living Expenses: Not hard at all  Food Insecurity: No Food Insecurity   Worried About Charity fundraiser in the Last Year: Never true   Arboriculturist in the Last Year: Never true  Transportation Needs: No Transportation Needs   Lack of Transportation (Medical):  No   Lack of Transportation (Non-Medical): No  Physical Activity: Insufficiently Active   Days of Exercise per Week: 3 days   Minutes of Exercise per Session: 20 min  Stress: No Stress Concern Present   Feeling of Stress : Not at all  Social Connections: Socially Isolated   Frequency of Communication with Friends and Family: Twice a week   Frequency of Social Gatherings with Friends and Family: Twice a week   Attends Religious Services: Never   Marine scientist or Organizations: No   Attends Archivist Meetings: Never   Marital Status: Widowed    Outpatient Encounter Medications as of 01/14/2021  Medication Sig   acetaminophen (TYLENOL) 500 MG tablet Take 1,000 mg by mouth every 6 (six) hours as needed.   atorvastatin (LIPITOR) 10 MG tablet TAKE 1 TABLET BY MOUTH EVERY DAY   beta carotene w/minerals (OCUVITE) tablet Take 1 tablet by mouth daily.   Calcium Carbonate-Vitamin D 600-400 MG-UNIT tablet Take 1 tablet by mouth daily.   co-enzyme Q-10 50 MG capsule Take 150 mg by mouth daily.   doxycycline (VIBRA-TABS) 100 MG tablet Take 1 tablet (100 mg total) by mouth 2 (two) times daily.   EPINEPHrine 0.3 mg/0.3 mL IJ SOAJ injection USE AS DIRECTED AS NEEDED   fluticasone (FLONASE) 50 MCG/ACT nasal spray Place into both nostrils daily.   guaiFENesin (MUCINEX) 600 MG 12 hr tablet Take by mouth 2 (two) times daily.   ibuprofen (ADVIL,MOTRIN) 200 MG tablet Take 200 mg by mouth every 6 (six) hours as needed.   levothyroxine (SYNTHROID) 25 MCG tablet TAKE 1 TABLET BY MOUTH EVERY DAY BEFORE BREAKFAST   loratadine (CLARITIN) 10 MG tablet Take 10 mg by mouth as needed.   ondansetron (ZOFRAN-ODT) 4 MG disintegrating tablet Take 1 tablet (4 mg total) by mouth every 8 (eight) hours as needed for nausea or vomiting (or migraine).   promethazine-dextromethorphan (PROMETHAZINE-DM) 6.25-15 MG/5ML syrup Take 5 mLs by mouth 4 (four) times daily as needed for cough.    telmisartan-hydrochlorothiazide (MICARDIS HCT) 40-12.5 MG tablet TAKE 1 TABLET BY MOUTH EVERY DAY   Turmeric 500 MG TABS Take by mouth.   [DISCONTINUED] diphenhydrAMINE (BENADRYL) 25 MG tablet Take 25 mg by mouth every 6 (six) hours as needed.   No facility-administered encounter medications on file as of 01/14/2021.    EXAM:  BP (!) 150/86 (BP Location: Left Arm, Patient Position: Sitting, Cuff Size: Normal)   Pulse 78  Temp 98.2 F (36.8 C) (Oral)   Ht 5\' 5"  (1.651 m)   Wt 157 lb (71.2 kg)   SpO2 98%   BMI 26.13 kg/m   Body mass index is 26.13 kg/m. Reviewed blood pressure log readings in the past weeks 126 117 129 128/75 7476 range pulse is in the 78-80 range Physical Exam: Vital signs reviewed DXI:PJAS is a well-developed well-nourished alert cooperative   who appears stated age in no acute distress.  HEENT: normocephalic atraumatic , Eyes: PERRL EOM's full, conjunctiva clear, deformity discharge  Ears: no deformity EAC's clear TMs with normal landmarks. Mouth: masked  NECK: supple without masses, thyromegaly or bruits. CHEST/PULM:  Clear to auscultation and percussion bre breast no nodule dc seen breath sounds equal no wheeze , rales or rhonchi. No chest wall deformities or tenderness. CV: PMI is nondisplaced, S1 S2 no gallops, murmurs, rubs. Peripheral pulses without delay.No JVD .  ABDOMEN: Bowel sounds normal nontender  No guard or rebound, no hepato splenomegal no CVA tenderness.   Extremtities:  No clubbing cyanosis or edema, no acute joint swelling or redness no focal atrophy NEURO:  Oriented x3, cranial nerves 3-12 appear to be intact,  gait facile SKIN: No acute rashes normal turgor, color, no bruising or petechiae. PSYCH: Oriented, good eye contact, no obvious depression anxiety, cognition and judgment appear normal. LN: no cervical axillary inguinal adenopathy No noted deficits in memory, attention, and speech.   Lab Results  Component Value Date   WBC 5.3  01/08/2021   HGB 14.4 01/08/2021   HCT 43.1 01/08/2021   PLT 176.0 01/08/2021   GLUCOSE 108 (H) 01/08/2021   CHOL 147 01/08/2021   TRIG 129.0 01/08/2021   HDL 40.00 01/08/2021   LDLDIRECT 171.0 06/17/2006   LDLCALC 81 01/08/2021   ALT 19 01/08/2021   AST 17 01/08/2021   NA 137 01/08/2021   K 4.2 01/08/2021   CL 99 01/08/2021   CREATININE 0.85 01/08/2021   BUN 16 01/08/2021   CO2 32 01/08/2021   TSH 3.00 01/08/2021   HGBA1C 5.3 10/16/2014   Lab review  ASSESSMENT AND PLAN:  Discussed the following assessment and plan:  Visit for preventive health examination  Medication management - Plan: CBC with Differential/Platelet  Essential hypertension  Hypothyroidism, unspecified type - Plan: TSH, Hemoglobin A1c  POLIOMYELITIS, HX OF  Hyperlipidemia, unspecified hyperlipidemia type  Elevated blood sugar - Plan: TSH, Hemoglobin A1c Plan 6 mos  hg a1c and tsh  cbc diff  eos increase  prob from allergy conditions. Disc w patient  Back mechanical  scoliosis and past hx of polio could effect gait   mechanics  last dexa showed mild osteopenia   Cont same meds  control bp   Plan yearly cpx or as indicated   Patient Care Team: Shoni Quijas, Standley Brooking, MD as PCP - General Ernesto Rutherford Maudry Mayhew, MD (Otolaryngology) Allyn Kenner, MD (Dermatology) Latanya Maudlin, MD (Orthopedic Surgery) ed Eddie Dibbles (Ophthalmology) Tiajuana Amass, MD as Consulting Physician (Allergy and Immunology)  Patient Instructions  Good to see you today .  BP at goal at home  Continue ocass monitoring  Plan tsh and hg a1c  cbc diff in about 6 mos( no visit needed if doing well).  Health Maintenance, Female Adopting a healthy lifestyle and getting preventive care are important in promoting health and wellness. Ask your health care provider about: The right schedule for you to have regular tests and exams. Things you can do on your own to prevent diseases and keep  yourself healthy. What should I know about diet, weight, and  exercise? Eat a healthy diet  Eat a diet that includes plenty of vegetables, fruits, low-fat dairy products, and lean protein. Do not eat a lot of foods that are high in solid fats, added sugars, or sodium. Maintain a healthy weight Body mass index (BMI) is used to identify weight problems. It estimates body fat based on height and weight. Your health care provider can help determine your BMI and help you achieve or maintain a healthy weight. Get regular exercise Get regular exercise. This is one of the most important things you can do for your health. Most adults should: Exercise for at least 150 minutes each week. The exercise should increase your heart rate and make you sweat (moderate-intensity exercise). Do strengthening exercises at least twice a week. This is in addition to the moderate-intensity exercise. Spend less time sitting. Even light physical activity can be beneficial. Watch cholesterol and blood lipids Have your blood tested for lipids and cholesterol at 76 years of age, then have this test every 5 years. Have your cholesterol levels checked more often if: Your lipid or cholesterol levels are high. You are older than 76 years of age. You are at high risk for heart disease. What should I know about cancer screening? Depending on your health history and family history, you may need to have cancer screening at various ages. This may include screening for: Breast cancer. Cervical cancer. Colorectal cancer. Skin cancer. Lung cancer. What should I know about heart disease, diabetes, and high blood pressure? Blood pressure and heart disease High blood pressure causes heart disease and increases the risk of stroke. This is more likely to develop in people who have high blood pressure readings or are overweight. Have your blood pressure checked: Every 3-5 years if you are 27-66 years of age. Every year if you are 85 years old or older. Diabetes Have regular diabetes  screenings. This checks your fasting blood sugar level. Have the screening done: Once every three years after age 46 if you are at a normal weight and have a low risk for diabetes. More often and at a younger age if you are overweight or have a high risk for diabetes. What should I know about preventing infection? Hepatitis B If you have a higher risk for hepatitis B, you should be screened for this virus. Talk with your health care provider to find out if you are at risk for hepatitis B infection. Hepatitis C Testing is recommended for: Everyone born from [birth year] through 1965. Anyone with known risk factors for hepatitis C. Sexually transmitted infections (STIs) Get screened for STIs, including gonorrhea and chlamydia, if: You are sexually active and are younger than 76 years of age. You are older than 76 years of age and your health care provider tells you that you are at risk for this type of infection. Your sexual activity has changed since you were last screened, and you are at increased risk for chlamydia or gonorrhea. Ask your health care provider if you are at risk. Ask your health care provider about whether you are at high risk for HIV. Your health care provider may recommend a prescription medicine to help prevent HIV infection. If you choose to take medicine to prevent HIV, you should first get tested for HIV. You should then be tested every 3 months for as long as you are taking the medicine. Pregnancy If you are about to stop having your period (premenopausal) and  you may become pregnant, seek counseling before you get pregnant. Take 400 to 800 micrograms (mcg) of folic acid every day if you become pregnant. Ask for birth control (contraception) if you want to prevent pregnancy. Osteoporosis and menopause Osteoporosis is a disease in which the bones lose minerals and strength with aging. This can result in bone fractures. If you are 31 years old or older, or if you are at risk for  osteoporosis and fractures, ask your health care provider if you should: Be screened for bone loss. Take a calcium or vitamin D supplement to lower your risk of fractures. Be given hormone replacement therapy (HRT) to treat symptoms of menopause. Follow these instructions at home: Alcohol use Do not drink alcohol if: Your health care provider tells you not to drink. You are pregnant, may be pregnant, or are planning to become pregnant. If you drink alcohol: Limit how much you have to: 0-1 drink a day. Know how much alcohol is in your drink. In the [place] one drink equals one 12 oz bottle of beer (355 mL), one 5 oz glass of wine (148 mL), or one 1 oz glass of hard liquor (44 mL). Lifestyle Do not use any products that contain nicotine or tobacco. These products include cigarettes, chewing tobacco, and vaping devices, such as e-cigarettes. If you need help quitting, ask your health care provider. Do not use street drugs. Do not share needles. Ask your health care provider for help if you need support or information about quitting drugs. General instructions Schedule regular health, dental, and eye exams. Stay current with your vaccines. Tell your health care provider if: You often feel depressed. You have ever been abused or do not feel safe at home. Summary Adopting a healthy lifestyle and getting preventive care are important in promoting health and wellness. Follow your health care provider's instructions about healthy diet, exercising, and getting tested or screened for diseases. Follow your health care provider's instructions on monitoring your cholesterol and blood pressure. This information is not intended to replace advice given to you by your health care provider. Make sure you discuss any questions you have with your health care provider. Document Revised: 06/25/2020 Document Reviewed: 06/25/2020 Elsevier Patient Education  2022 [place] Cheryl Robbins  M.D.

## 2021-01-14 ENCOUNTER — Ambulatory Visit (INDEPENDENT_AMBULATORY_CARE_PROVIDER_SITE_OTHER): Payer: Medicare Other | Admitting: Internal Medicine

## 2021-01-14 ENCOUNTER — Encounter: Payer: Self-pay | Admitting: Internal Medicine

## 2021-01-14 VITALS — BP 150/86 | HR 78 | Temp 98.2°F | Ht 65.0 in | Wt 157.0 lb

## 2021-01-14 DIAGNOSIS — R739 Hyperglycemia, unspecified: Secondary | ICD-10-CM

## 2021-01-14 DIAGNOSIS — E039 Hypothyroidism, unspecified: Secondary | ICD-10-CM

## 2021-01-14 DIAGNOSIS — Z8612 Personal history of poliomyelitis: Secondary | ICD-10-CM

## 2021-01-14 DIAGNOSIS — I10 Essential (primary) hypertension: Secondary | ICD-10-CM | POA: Diagnosis not present

## 2021-01-14 DIAGNOSIS — Z0000 Encounter for general adult medical examination without abnormal findings: Secondary | ICD-10-CM

## 2021-01-14 DIAGNOSIS — E785 Hyperlipidemia, unspecified: Secondary | ICD-10-CM

## 2021-01-14 DIAGNOSIS — Z79899 Other long term (current) drug therapy: Secondary | ICD-10-CM | POA: Diagnosis not present

## 2021-01-14 NOTE — Progress Notes (Signed)
Revewied at CPX OV

## 2021-01-14 NOTE — Patient Instructions (Addendum)
Good to see you today .  BP at goal at home  Continue ocass monitoring  Plan tsh and hg a1c  cbc diff in about 6 mos( no visit needed if doing well).  Health Maintenance, Female Adopting a healthy lifestyle and getting preventive care are important in promoting health and wellness. Ask your health care provider about: The right schedule for you to have regular tests and exams. Things you can do on your own to prevent diseases and keep yourself healthy. What should I know about diet, weight, and exercise? Eat a healthy diet  Eat a diet that includes plenty of vegetables, fruits, low-fat dairy products, and lean protein. Do not eat a lot of foods that are high in solid fats, added sugars, or sodium. Maintain a healthy weight Body mass index (BMI) is used to identify weight problems. It estimates body fat based on height and weight. Your health care provider can help determine your BMI and help you achieve or maintain a healthy weight. Get regular exercise Get regular exercise. This is one of the most important things you can do for your health. Most adults should: Exercise for at least 150 minutes each week. The exercise should increase your heart rate and make you sweat (moderate-intensity exercise). Do strengthening exercises at least twice a week. This is in addition to the moderate-intensity exercise. Spend less time sitting. Even light physical activity can be beneficial. Watch cholesterol and blood lipids Have your blood tested for lipids and cholesterol at 76 years of age, then have this test every 5 years. Have your cholesterol levels checked more often if: Your lipid or cholesterol levels are high. You are older than 76 years of age. You are at high risk for heart disease. What should I know about cancer screening? Depending on your health history and family history, you may need to have cancer screening at various ages. This may include screening for: Breast cancer. Cervical  cancer. Colorectal cancer. Skin cancer. Lung cancer. What should I know about heart disease, diabetes, and high blood pressure? Blood pressure and heart disease High blood pressure causes heart disease and increases the risk of stroke. This is more likely to develop in people who have high blood pressure readings or are overweight. Have your blood pressure checked: Every 3-5 years if you are 44-95 years of age. Every year if you are 23 years old or older. Diabetes Have regular diabetes screenings. This checks your fasting blood sugar level. Have the screening done: Once every three years after age 58 if you are at a normal weight and have a low risk for diabetes. More often and at a younger age if you are overweight or have a high risk for diabetes. What should I know about preventing infection? Hepatitis B If you have a higher risk for hepatitis B, you should be screened for this virus. Talk with your health care provider to find out if you are at risk for hepatitis B infection. Hepatitis C Testing is recommended for: Everyone born from [birth year] through 1966. Anyone with known risk factors for hepatitis C. Sexually transmitted infections (STIs) Get screened for STIs, including gonorrhea and chlamydia, if: You are sexually active and are younger than 76 years of age. You are older than 76 years of age and your health care provider tells you that you are at risk for this type of infection. Your sexual activity has changed since you were last screened, and you are at increased risk for chlamydia or gonorrhea. Ask  your health care provider if you are at risk. Ask your health care provider about whether you are at high risk for HIV. Your health care provider may recommend a prescription medicine to help prevent HIV infection. If you choose to take medicine to prevent HIV, you should first get tested for HIV. You should then be tested every 3 months for as long as you are taking the  medicine. Pregnancy If you are about to stop having your period (premenopausal) and you may become pregnant, seek counseling before you get pregnant. Take 400 to 800 micrograms (mcg) of folic acid every day if you become pregnant. Ask for birth control (contraception) if you want to prevent pregnancy. Osteoporosis and menopause Osteoporosis is a disease in which the bones lose minerals and strength with aging. This can result in bone fractures. If you are 51 years old or older, or if you are at risk for osteoporosis and fractures, ask your health care provider if you should: Be screened for bone loss. Take a calcium or vitamin D supplement to lower your risk of fractures. Be given hormone replacement therapy (HRT) to treat symptoms of menopause. Follow these instructions at home: Alcohol use Do not drink alcohol if: Your health care provider tells you not to drink. You are pregnant, may be pregnant, or are planning to become pregnant. If you drink alcohol: Limit how much you have to: 0-1 drink a day. Know how much alcohol is in your drink. In the [place] one drink equals one 12 oz bottle of beer (355 mL), one 5 oz glass of wine (148 mL), or one 1 oz glass of hard liquor (44 mL). Lifestyle Do not use any products that contain nicotine or tobacco. These products include cigarettes, chewing tobacco, and vaping devices, such as e-cigarettes. If you need help quitting, ask your health care provider. Do not use street drugs. Do not share needles. Ask your health care provider for help if you need support or information about quitting drugs. General instructions Schedule regular health, dental, and eye exams. Stay current with your vaccines. Tell your health care provider if: You often feel depressed. You have ever been abused or do not feel safe at home. Summary Adopting a healthy lifestyle and getting preventive care are important in promoting health and wellness. Follow your health care  provider's instructions about healthy diet, exercising, and getting tested or screened for diseases. Follow your health care provider's instructions on monitoring your cholesterol and blood pressure. This information is not intended to replace advice given to you by your health care provider. Make sure you discuss any questions you have with your health care provider. Document Revised: 06/25/2020 Document Reviewed: 06/25/2020 Elsevier Patient Education  [place].

## 2021-01-15 LAB — HM MAMMOGRAPHY

## 2021-01-16 ENCOUNTER — Encounter: Payer: Self-pay | Admitting: Internal Medicine

## 2021-01-29 ENCOUNTER — Other Ambulatory Visit: Payer: Self-pay | Admitting: Internal Medicine

## 2021-03-25 ENCOUNTER — Other Ambulatory Visit: Payer: Self-pay | Admitting: Family Medicine

## 2021-04-09 ENCOUNTER — Encounter: Payer: Self-pay | Admitting: Family Medicine

## 2021-04-09 ENCOUNTER — Telehealth (INDEPENDENT_AMBULATORY_CARE_PROVIDER_SITE_OTHER): Payer: Medicare Other | Admitting: Family Medicine

## 2021-04-09 VITALS — Ht 65.0 in | Wt 157.0 lb

## 2021-04-09 DIAGNOSIS — R0981 Nasal congestion: Secondary | ICD-10-CM | POA: Diagnosis not present

## 2021-04-09 DIAGNOSIS — R059 Cough, unspecified: Secondary | ICD-10-CM

## 2021-04-09 DIAGNOSIS — R519 Headache, unspecified: Secondary | ICD-10-CM | POA: Diagnosis not present

## 2021-04-09 MED ORDER — BENZONATATE 100 MG PO CAPS: ORAL_CAPSULE | ORAL | 0 refills | Status: DC

## 2021-04-09 MED ORDER — DOXYCYCLINE HYCLATE 100 MG PO TABS: 100.0000 mg | ORAL_TABLET | Freq: Two times a day (BID) | ORAL | 0 refills | Status: DC

## 2021-04-09 MED ORDER — FLUCONAZOLE 150 MG PO TABS: 150.0000 mg | ORAL_TABLET | Freq: Every day | ORAL | 0 refills | Status: DC

## 2021-04-09 NOTE — Patient Instructions (Signed)
-  I sent the medication(s) we discussed to your pharmacy: Meds ordered this encounter  Medications   benzonatate (TESSALON PERLES) 100 MG capsule    Sig: 1-2 capsules up to twice daily as needed for cough    Dispense:  30 capsule    Refill:  0   doxycycline (VIBRA-TABS) 100 MG tablet    Sig: Take 1 tablet (100 mg total) by mouth 2 (two) times daily.    Dispense:  20 tablet    Refill:  0   fluconazole (DIFLUCAN) 150 MG tablet    Sig: Take 1 tablet (150 mg total) by mouth daily.    Dispense:  1 tablet    Refill:  0     I hope you are feeling better soon!  Seek in person care promptly if your symptoms worsen, new concerns arise or you are not improving with treatment.  It was nice to meet you today. I help [facility] out with telemedicine visits on Tuesdays and Thursdays and am happy to help if you need a virtual follow up visit on those days. Otherwise, if you have any concerns or questions following this visit please schedule a follow up visit with your Primary Care office or seek care at a local urgent care clinic to avoid delays in care

## 2021-04-09 NOTE — Progress Notes (Signed)
Virtual Visit via Video Note  I connected with Cheryl Robbins  on 04/09/21 at  3:00 PM EST by a video enabled telemedicine application and verified that I am speaking with the correct person using two identifiers.  Location patient: [place] Location provider:work or home office Persons participating in the virtual visit: patient, provider  I discussed the limitations and requested verbal permission for telemedicine visit. The patient expressed understanding and agreed to proceed.   HPI:  Acute telemedicine visit for cough and sinus issues: -Onset: 3-4 weeks ago as a cold, but now feels this has turned into a sinus infection -had negative covid testing -Symptoms include: cough, pnd, nasal congestion - worse the last week with green and yellow mucus, has some facial discomfort -Denies: fever, CP, SOB, diarrhea -Pertinent past medical history: see below, reports a history of sinus infections -Pertinent medication allergies: Allergies  Allergen Reactions   Penicillins     Gums swelled up and turned blue   Ace Inhibitors Cough    Went away when med changed    Clindamycin/Lincomycin     Had rash with diflucan and clind but cant tell which one it was  As per dr Ernesto Rutherford   Sulfonamide Derivatives    Simvastatin Other (See Comments)    Body leg aches  On simva and doxy better off see July 17 note  -COVID-19 vaccine status:  Immunization History  Administered Date(s) Administered   Fluad Quad(high Dose 65+) 11/20/2018, 11/30/2020   Influenza Split 11/26/2010, 12/05/2011   Influenza Whole 11/27/2006, 12/01/2007, 11/29/2008   Influenza, High Dose Seasonal PF 12/06/2013, 11/06/2014, 11/25/2015, 11/22/2016, 11/16/2017   Influenza,inj,Quad PF,6+ Mos 10/21/2012   Influenza-Unspecified 11/22/2016, 11/25/2019   PFIZER(Purple Top)SARS-COV-2 Vaccination 03/27/2019, 04/20/2019, 12/06/2019   Pneumococcal Conjugate-13 06/24/2013   Pneumococcal Polysaccharide-23 12/04/2009   Td 07/30/2006   Tdap 08/22/2015    Zoster Recombinat (Shingrix) 01/03/2017, 03/10/2017   Zoster, Live 02/19/2009  -wants diflucan in case gets yeast infection after abx as reports always requires and works better for her than OTC options   ROS: See pertinent positives and negatives per HPI.  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   CATARACT EXTRACTION     Both eyes   COLONOSCOPY     EYE SURGERY     OOPHORECTOMY     tonsilletomy  1953     Current Outpatient Medications:    acetaminophen (TYLENOL) 500 MG tablet, Take 1,000 mg by mouth every 6 (six) hours as needed., Disp: , Rfl:    atorvastatin (LIPITOR) 10 MG tablet, TAKE 1 TABLET BY MOUTH EVERY DAY, Disp: 90 tablet, Rfl: 1   benzonatate (TESSALON PERLES) 100 MG capsule, 1-2 capsules up to twice daily as needed for cough, Disp: 30 capsule, Rfl: 0   beta carotene w/minerals (OCUVITE) tablet, Take 1 tablet by mouth daily., Disp: , Rfl:    Calcium Carbonate-Vitamin D 600-400 MG-UNIT tablet, Take 1 tablet by mouth daily., Disp: , Rfl:    co-enzyme Q-10 50 MG capsule,  Take 150 mg by mouth daily., Disp: , Rfl:    doxycycline (VIBRA-TABS) 100 MG tablet, Take 1 tablet (100 mg total) by mouth 2 (two) times daily., Disp: 20 tablet, Rfl: 0   EPINEPHrine 0.3 mg/0.3 mL IJ SOAJ injection, USE AS DIRECTED AS NEEDED, Disp: , Rfl:    fluconazole (DIFLUCAN) 150 MG tablet, Take 1 tablet (150 mg total) by mouth daily., Disp: 1 tablet, Rfl: 0   fluticasone (FLONASE) 50 MCG/ACT nasal spray, Place into both nostrils daily., Disp: , Rfl:    guaiFENesin (MUCINEX) 600 MG 12 hr  tablet, Take by mouth 2 (two) times daily., Disp: , Rfl:    ibuprofen (ADVIL,MOTRIN) 200 MG tablet, Take 200 mg by mouth every 6 (six) hours as needed., Disp: , Rfl:    levothyroxine (SYNTHROID) 25 MCG tablet, TAKE 1 TABLET BY MOUTH EVERY DAY BEFORE BREAKFAST, Disp: 90 tablet, Rfl: 1   loratadine (CLARITIN) 10 MG tablet, Take 10 mg by mouth as needed., Disp: , Rfl:    ondansetron (ZOFRAN-ODT) 4 MG disintegrating tablet, Take 1 tablet (4 mg total) by mouth every 8 (eight) hours as needed for nausea or vomiting (or migraine)., Disp: 15 tablet, Rfl: 1   promethazine-dextromethorphan (PROMETHAZINE-DM) 6.25-15 MG/5ML syrup, Take 5 mLs by mouth 4 (four) times daily as needed for cough., Disp: 118 mL, Rfl: 0   telmisartan-hydrochlorothiazide (MICARDIS HCT) 40-12.5 MG tablet, TAKE 1 TABLET BY MOUTH EVERY DAY, Disp: 90 tablet, Rfl: 3   Turmeric 500 MG TABS, Take by mouth., Disp: , Rfl:   EXAM:  VITALS per patient if applicable:  GENERAL: alert, oriented, appears well and in no acute distress  HEENT: atraumatic, conjunttiva clear, no obvious abnormalities on inspection of external nose and ears  NECK: normal movements of the head and neck  LUNGS: on inspection no signs of respiratory distress, breathing rate appears normal, no obvious gross SOB, gasping or wheezing  CV: no obvious cyanosis  MS: moves all visible extremities without noticeable abnormality  PSYCH/NEURO: pleasant and cooperative, no obvious depression or anxiety, speech and thought processing grossly intact  ASSESSMENT AND PLAN:  Discussed the following assessment and plan:  Nasal congestion  Cough, unspecified type  Facial discomfort  -we discussed possible serious and likely etiologies, options for evaluation and workup, limitations of telemedicine visit vs in person visit, treatment, treatment risks and precautions. Pt is agreeable to treatment via telemedicine at this moment. Query developing sinusitis vs other. She has  opted to try saline sinus rinses and tessalon rx with initiation of doxy 100mg  bid x 10 days if worsening or not improving. Sent diflucan in case develops yeast vaginitis per her request. Advised to seek prompt virtual visit or in person care if worsening, new symptoms arise, or if is not improving with treatment as expected per our conversation of expected course. Discussed options for follow up care. Did let this patient know that I do telemedicine on Tuesdays and Thursdays for [facility] and those are the days I am logged into the system. Advised to schedule follow up visit with PCP, [place] virtual visits or [facility] if any further questions or concerns to avoid delays in care.   I discussed the assessment and treatment plan with the patient. The patient was provided an opportunity to ask questions and all were answered. The patient agreed with the plan and demonstrated an understanding of the instructions.     Lucretia Kern, DO

## 2021-04-18 ENCOUNTER — Other Ambulatory Visit: Payer: Self-pay | Admitting: Internal Medicine

## 2021-04-18 DIAGNOSIS — E039 Hypothyroidism, unspecified: Secondary | ICD-10-CM

## 2021-05-06 ENCOUNTER — Other Ambulatory Visit (INDEPENDENT_AMBULATORY_CARE_PROVIDER_SITE_OTHER): Payer: Medicare Other

## 2021-05-06 DIAGNOSIS — E039 Hypothyroidism, unspecified: Secondary | ICD-10-CM

## 2021-05-06 DIAGNOSIS — Z79899 Other long term (current) drug therapy: Secondary | ICD-10-CM | POA: Diagnosis not present

## 2021-05-06 DIAGNOSIS — R739 Hyperglycemia, unspecified: Secondary | ICD-10-CM

## 2021-05-06 LAB — CBC WITH DIFFERENTIAL/PLATELET
Basophils Absolute: 0 10*3/uL (ref 0.0–0.1)
Basophils Relative: 0.3 % (ref 0.0–3.0)
Eosinophils Absolute: 0.7 10*3/uL (ref 0.0–0.7)
Eosinophils Relative: 13.2 % — ABNORMAL HIGH (ref 0.0–5.0)
HCT: 43.9 % (ref 36.0–46.0)
Hemoglobin: 14.6 g/dL (ref 12.0–15.0)
Lymphocytes Relative: 23.9 % (ref 12.0–46.0)
Lymphs Abs: 1.3 10*3/uL (ref 0.7–4.0)
MCHC: 33.3 g/dL (ref 30.0–36.0)
MCV: 84.1 fl (ref 78.0–100.0)
Monocytes Absolute: 0.4 10*3/uL (ref 0.1–1.0)
Monocytes Relative: 6.5 % (ref 3.0–12.0)
Neutro Abs: 3.1 10*3/uL (ref 1.4–7.7)
Neutrophils Relative %: 56.1 % (ref 43.0–77.0)
Platelets: 168 10*3/uL (ref 150.0–400.0)
RBC: 5.21 Mil/uL — ABNORMAL HIGH (ref 3.87–5.11)
RDW: 13.5 % (ref 11.5–15.5)
WBC: 5.6 10*3/uL (ref 4.0–10.5)

## 2021-05-06 LAB — TSH: TSH: 2.16 u[IU]/mL (ref 0.35–5.50)

## 2021-05-06 LAB — HEMOGLOBIN A1C: Hgb A1c MFr Bld: 5.6 % (ref 4.6–6.5)

## 2021-05-10 NOTE — Progress Notes (Signed)
Blood count stable  w     slight  elevation of eos( often up in allergy )   about the same  thyroid is in range . A1c  is normal  still .   Cholesterol at goal  ?No change in med dosing.

## 2021-06-24 ENCOUNTER — Other Ambulatory Visit: Payer: Self-pay | Admitting: Family Medicine

## 2021-07-02 NOTE — Progress Notes (Signed)
Cheryl Robbins Cheryl Robbins [address] [place] Phone: [phone] Subjective:   Cheryl Robbins, am serving as a scribe for Dr. Hulan Saas. This visit occurred during the SARS-CoV-2 public health emergency.  Safety protocols were in place, including screening questions prior to the visit, additional usage of staff PPE, and extensive cleaning of exam room while observing appropriate contact time as indicated for disinfecting solutions.   I'm seeing this patient by the request  of:  Panosh, Standley Brooking, MD  CC: Pelvic pain  BTD:[id number]  Cheryl Robbins is a 76 y.o. female coming in with complaint of pelvic pain. Last seen for B hip pain in 05/11/2016. Patient states lower back pain especially when picking up grandchildren. Feels like she gets midback muscle spasm. They don't last long and stretching helps.  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 Since we have seen patient went to general surgery and had it removed. 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   Primiparous    Rosacea    Past Surgical History:  Procedure Laterality Date   atypical moles     bilateral salpingoopherectomy     for r 4cm clear r adnexal mass 12-May-2002 serous cystadenoma   CATARACT EXTRACTION     Both eyes   COLONOSCOPY     EYE SURGERY     OOPHORECTOMY     tonsilletomy  1953   Social History    Socioeconomic History   Marital status: Unknown    Spouse name: Not on file   Number of children: Not on file   Years of education: Not on file   Highest education level: Not on file  Occupational History   Occupation: Retired  Tobacco Use   Smoking status: Never   Smokeless tobacco: Never  Vaping Use   Vaping Use: Never used  Substance and Sexual Activity   Alcohol use: No   Drug use: No   Sexual activity: Not on file  Other Topics Concern   Not on file  Social History Narrative   hh of 1   No pets   No ets.   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      Mom age 53 friends home snif recently af TKR infection passed away 12-May-2015    Social Determinants of Health   Financial Resource Strain: Low Risk    Difficulty of Paying Living Expenses: Not hard at all  Food Insecurity: No Food Insecurity   Worried About Charity fundraiser in the Last Year: Never true   Arboriculturist in the Last Year: Never true  Transportation Needs: No Transportation Needs   Lack of Transportation (Medical): No   Lack of Transportation (Non-Medical): No  Physical  Activity: Insufficiently Active   Days of Exercise per Week: 3 days   Minutes of Exercise per Session: 20 min  Stress: No Stress Concern Present   Feeling of Stress : Not at all  Social Connections: Socially Isolated   Frequency of Communication with Friends and Family: Twice a week   Frequency of Social Gatherings with Friends and Family: Twice a week   Attends Religious Services: Never   Marine scientist or Organizations: No   Attends Archivist Meetings: Never   Marital Status: Widowed   Allergies  Allergen Reactions   Penicillins     Gums swelled up and turned blue   Ace Inhibitors Cough    Went away when med changed    Clindamycin/Lincomycin     Had rash with diflucan and clind but cant tell which one it was  As per dr Ernesto Rutherford   Sulfonamide  Derivatives    Simvastatin Other (See Comments)    Body leg aches  On simva and doxy better off see July 17 note   Family History  Problem Relation Age of Onset   Atrial fibrillation Mother        had cv in her 35 s   Arthritis Mother        had staph infection knee replacment   Cancer Sister        thryoid   Fibromyalgia Brother    Colon cancer Neg Hx    Rectal cancer Neg Hx    Stomach cancer Neg Hx     Current Outpatient Medications (Endocrine & Metabolic):    levothyroxine (SYNTHROID) 25 MCG tablet, TAKE 1 TABLET BY MOUTH EVERY DAY BEFORE BREAKFAST  Current Outpatient Medications (Cardiovascular):    atorvastatin (LIPITOR) 10 MG tablet, TAKE 1 TABLET BY MOUTH EVERY DAY   EPINEPHrine 0.3 mg/0.3 mL IJ SOAJ injection, USE AS DIRECTED AS NEEDED   telmisartan-hydrochlorothiazide (MICARDIS HCT) 40-12.5 MG tablet, TAKE 1 TABLET BY MOUTH EVERY DAY  Current Outpatient Medications (Respiratory):    benzonatate (TESSALON PERLES) 100 MG capsule, 1-2 capsules up to twice daily as needed for cough   fluticasone (FLONASE) 50 MCG/ACT nasal spray, Place into both nostrils daily.   guaiFENesin (MUCINEX) 600 MG 12 hr tablet, Take by mouth 2 (two) times daily.   loratadine (CLARITIN) 10 MG tablet, Take 10 mg by mouth as needed.   promethazine-dextromethorphan (PROMETHAZINE-DM) 6.25-15 MG/5ML syrup, Take 5 mLs by mouth 4 (four) times daily as needed for cough.  Current Outpatient Medications (Analgesics):    acetaminophen (TYLENOL) 500 MG tablet, Take 1,000 mg by mouth every 6 (six) hours as needed.   ibuprofen (ADVIL,MOTRIN) 200 MG tablet, Take 200 mg by mouth every 6 (six) hours as needed.   Current Outpatient Medications (Other):    beta carotene w/minerals (OCUVITE) tablet, Take 1 tablet by mouth daily.   Calcium Carbonate-Vitamin D 600-400 MG-UNIT tablet, Take 1 tablet by mouth daily.   co-enzyme Q-10 50 MG capsule, Take 150 mg by mouth daily.   doxycycline (VIBRA-TABS) 100 MG tablet,  Take 1 tablet (100 mg total) by mouth 2 (two) times daily.   fluconazole (DIFLUCAN) 150 MG tablet, Take 1 tablet (150 mg total) by mouth daily.   ondansetron (ZOFRAN-ODT) 4 MG disintegrating tablet, Take 1 tablet (4 mg total) by mouth every 8 (eight) hours as needed for nausea or vomiting (or migraine).   Turmeric 500 MG TABS, Take by mouth.   Reviewed prior external information including notes and imaging from  primary  care provider As well as notes that were available from care everywhere and other healthcare systems.  Past medical history, social, surgical and family history all reviewed in electronic medical record.  No pertanent information unless stated regarding to the chief complaint.   Review of Systems:  No headache, visual changes, nausea, vomiting, diarrhea, constipation, dizziness, abdominal pain, skin rash, fevers, chills, night sweats, weight loss, swollen lymph nodes, body aches, joint swelling, chest pain, shortness of breath, mood changes. POSITIVE muscle aches  Objective  Blood pressure (!) 142/86, pulse 97, height '5\' 5"'$  (1.651 m), weight 160 lb (72.6 kg), SpO2 96 %.   General: No apparent distress alert and oriented x3 mood and affect normal, dressed appropriately.  HEENT: Pupils equal, extraocular movements intact  Respiratory: Patient's speak in full sentences and does not appear short of breath  Cardiovascular: No lower extremity edema, non tender, no erythema  Patient back does have some degenerative scoliosis noted.  Does have tightness lacking last 5 degrees of flexion in the last 15 degrees of extension.  Limited sidebending as well.  Tightness of the hip flexors noted bilaterally right greater than left.  [id number]; 15 additional minutes spent for Therapeutic exercises as stated in above notes.  This included exercises focusing on stretching, strengthening, with significant focus on eccentric aspects.   Long term goals include an improvement in range of motion,  strength, endurance as well as avoiding reinjury. Patient's frequency would include in 1-2 times a day, 3-5 times a week for a duration of 6-12 weeks. Low back exercises that included:  Pelvic tilt/bracing instruction to focus on control of the pelvic girdle and lower abdominal muscles  Glute strengthening exercises, focusing on proper firing of the glutes without engaging the low back muscles Proper stretching techniques for maximum relief for the hamstrings, hip flexors, low back and some rotation where tolerated  Proper technique shown and discussed handout in great detail with ATC.  All questions were discussed and answered.      Impression and Recommendations:     The above documentation has been reviewed and is accurate and complete Lyndal Pulley, DO

## 2021-07-08 ENCOUNTER — Ambulatory Visit (INDEPENDENT_AMBULATORY_CARE_PROVIDER_SITE_OTHER): Payer: Medicare Other

## 2021-07-08 ENCOUNTER — Ambulatory Visit: Payer: Medicare Other | Admitting: Family Medicine

## 2021-07-08 VITALS — BP 142/86 | HR 97 | Ht 65.0 in | Wt 160.0 lb

## 2021-07-08 DIAGNOSIS — M545 Low back pain, unspecified: Secondary | ICD-10-CM | POA: Diagnosis not present

## 2021-07-08 DIAGNOSIS — M549 Dorsalgia, unspecified: Secondary | ICD-10-CM

## 2021-07-08 NOTE — Assessment & Plan Note (Addendum)
Chronic problem with exacerbation.  Patient does have what appears to be some degenerative scoliosis and significant tightness of the hip flexor.  Home exercises given, which activities to do which ones to avoid, increase activity slowly over the course the next several weeks.  X-rays ordered today as well to further evaluate any arthritic changes that could be contributing.  Patient declined formal physical therapy but will work on home exercises discussed which activities to do which ones to avoid.  Follow-up again in 6 to 8 weeks.

## 2021-07-08 NOTE — Patient Instructions (Signed)
Xray today Do prescribed exercises at least 3x a week '500mg'$  Tylenol during the day Advil at night '1200mg'$  Tart Cherry at night See you again in 5-6 weeks

## 2021-09-04 NOTE — Progress Notes (Deleted)
[place] Phone: [phone] Subjective:    I'm seeing this patient by the request  of:  Panosh, Standley Brooking, MD  CC:   [id number]  07/08/2021 Chronic problem with exacerbation.  Patient does have what appears to be some degenerative scoliosis and significant tightness of the hip flexor.  Home exercises given, which activities to do which ones to avoid, increase activity slowly over the course the next several weeks.  X-rays ordered today as well to further evaluate any arthritic changes that could be contributing.  Patient declined formal physical therapy but will work on home exercises discussed which activities to do which ones to avoid.  Follow-up again in 6 to 8 weeks.  Update 09/10/2021 Cheryl Robbins is a 76 y.o. female coming in with complaint of lumbar spine and pelvic pain. Patient states   Xray lumbar 07/08/2021 IMPRESSION: 1. No acute fracture or subluxation of the lumbar spine. 2. Multilevel degenerative changes and dextroscoliosis.       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   CATARACT EXTRACTION     Both eyes   COLONOSCOPY     EYE SURGERY     OOPHORECTOMY     tonsilletomy  1953   Social History   Socioeconomic History   Marital status: Unknown    Spouse name: Not on file   Number of children: Not on file   Years of education: Not on file   Highest education level: Not on file  Occupational History   Occupation: Retired  Tobacco Use   Smoking status: Never   Smokeless  tobacco: Never  Vaping Use   Vaping Use: Never used  Substance and Sexual Activity   Alcohol use: No   Drug use: No   Sexual activity: Not on file  Other Topics Concern   Not on file  Social History Narrative   hh of 1   No pets   No ets.   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      Mom age 70 friends home snif recently af TKR infection passed away 05-09-15    Social Determinants of Health   Financial Resource Strain: Low Risk  (11/19/2020)   Overall Financial Resource Strain (CARDIA)    Difficulty of Paying Living Expenses: Not hard at all  Food Insecurity: No Food Insecurity (11/19/2020)   Hunger Vital Sign    Worried About Running Out of Food in the Last Year: Never true    Ran Out of Food in the Last Year: Never true  Transportation Needs: No Transportation Needs (11/19/2020)   PRAPARE - Hydrologist (Medical): No    Lack of Transportation (Non-Medical): No  Physical Activity: Insufficiently Active (11/19/2020)   Exercise Vital Sign    Days of Exercise per Week: 3 days    Minutes  of Exercise per Session: 20 min  Stress: No Stress Concern Present (11/19/2020)   [place]    Feeling of Stress : Not at all  Social Connections: Socially Isolated (11/19/2020)   Social Connection and Isolation Panel [NHANES]    Frequency of Communication with Friends and Family: Twice a week    Frequency of Social Gatherings with Friends and Family: Twice a week    Attends Religious Services: Never    Marine scientist or Organizations: No    Attends Archivist Meetings: Never    Marital Status: Widowed   Allergies  Allergen Reactions   Penicillins     Gums swelled up and turned blue   Ace Inhibitors Cough    Went away when med changed    Clindamycin/Lincomycin     Had rash with diflucan and clind but cant tell which  one it was  As per dr Ernesto Rutherford   Sulfonamide Derivatives    Simvastatin Other (See Comments)    Body leg aches  On simva and doxy better off see July 17 note   Family History  Problem Relation Age of Onset   Atrial fibrillation Mother        had cv in her 64 s   Arthritis Mother        had staph infection knee replacment   Cancer Sister        thryoid   Fibromyalgia Brother    Colon cancer Neg Hx    Rectal cancer Neg Hx    Stomach cancer Neg Hx     Current Outpatient Medications (Endocrine & Metabolic):    levothyroxine (SYNTHROID) 25 MCG tablet, TAKE 1 TABLET BY MOUTH EVERY DAY BEFORE BREAKFAST  Current Outpatient Medications (Cardiovascular):    atorvastatin (LIPITOR) 10 MG tablet, TAKE 1 TABLET BY MOUTH EVERY DAY   EPINEPHrine 0.3 mg/0.3 mL IJ SOAJ injection, USE AS DIRECTED AS NEEDED   telmisartan-hydrochlorothiazide (MICARDIS HCT) 40-12.5 MG tablet, TAKE 1 TABLET BY MOUTH EVERY DAY  Current Outpatient Medications (Respiratory):    benzonatate (TESSALON PERLES) 100 MG capsule, 1-2 capsules up to twice daily as needed for cough   fluticasone (FLONASE) 50 MCG/ACT nasal spray, Place into both nostrils daily.   guaiFENesin (MUCINEX) 600 MG 12 hr tablet, Take by mouth 2 (two) times daily.   loratadine (CLARITIN) 10 MG tablet, Take 10 mg by mouth as needed.   promethazine-dextromethorphan (PROMETHAZINE-DM) 6.25-15 MG/5ML syrup, Take 5 mLs by mouth 4 (four) times daily as needed for cough.  Current Outpatient Medications (Analgesics):    acetaminophen (TYLENOL) 500 MG tablet, Take 1,000 mg by mouth every 6 (six) hours as needed.   ibuprofen (ADVIL,MOTRIN) 200 MG tablet, Take 200 mg by mouth every 6 (six) hours as needed.   Current Outpatient Medications (Other):    beta carotene w/minerals (OCUVITE) tablet, Take 1 tablet by mouth daily.   Calcium Carbonate-Vitamin D 600-400 MG-UNIT tablet, Take 1 tablet by mouth daily.   co-enzyme Q-10 50 MG capsule, Take 150 mg by mouth  daily.   doxycycline (VIBRA-TABS) 100 MG tablet, Take 1 tablet (100 mg total) by mouth 2 (two) times daily.   fluconazole (DIFLUCAN) 150 MG tablet, Take 1 tablet (150 mg total) by mouth daily.   ondansetron (ZOFRAN-ODT) 4 MG disintegrating tablet, Take 1 tablet (4 mg total) by mouth every 8 (eight) hours as needed for nausea or vomiting (or migraine).   Turmeric 500 MG TABS, Take by mouth.  Reviewed prior external information including notes and imaging from  primary care provider As well as notes that were available from care everywhere and other healthcare systems.  Past medical history, social, surgical and family history all reviewed in electronic medical record.  No pertanent information unless stated regarding to the chief complaint.   Review of Systems:  No headache, visual changes, nausea, vomiting, diarrhea, constipation, dizziness, abdominal pain, skin rash, fevers, chills, night sweats, weight loss, swollen lymph nodes, body aches, joint swelling, chest pain, shortness of breath, mood changes. POSITIVE muscle aches  Objective  There were no vitals taken for this visit.   General: No apparent distress alert and oriented x3 mood and affect normal, dressed appropriately.  HEENT: Pupils equal, extraocular movements intact  Respiratory: Patient's speak in full sentences and does not appear short of breath  Cardiovascular: No lower extremity edema, non tender, no erythema      Impression and Recommendations:

## 2021-09-10 ENCOUNTER — Ambulatory Visit: Payer: Medicare Other | Admitting: Family Medicine

## 2021-09-23 ENCOUNTER — Other Ambulatory Visit: Payer: Self-pay | Admitting: Family Medicine

## 2021-09-27 ENCOUNTER — Other Ambulatory Visit: Payer: Self-pay | Admitting: Internal Medicine

## 2021-09-27 DIAGNOSIS — E039 Hypothyroidism, unspecified: Secondary | ICD-10-CM

## 2021-10-03 NOTE — Progress Notes (Signed)
Cheryl Robbins [address] [place] Phone: [phone] Subjective:   Cheryl Robbins, am serving as a scribe for Dr. Hulan Saas.  I'm seeing this patient by the request  of:  Panosh, Standley Brooking, MD  CC: back pain f/u   [id number]  07/08/2021 Chronic problem with exacerbation.  Patient does have what appears to be some degenerative scoliosis and significant tightness of the hip flexor.  Home exercises given, which activities to do which ones to avoid, increase activity slowly over the course the next several weeks.  X-rays ordered today as well to further evaluate any arthritic changes that could be contributing.  Patient declined formal physical therapy but will work on home exercises discussed which activities to do which ones to avoid.  Follow-up again in 6 to 8 weeks.  Updated 10/08/2021 Cheryl Robbins is a 76 y.o. female coming in with complaint of back pain. Doing much better. When she picks up something heavy she may have some pain, otherwise nothing. No new issues.  Xray IMPRESSION: 1. No acute fracture or subluxation of the lumbar spine. 2. Multilevel degenerative changes and dextroscoliosis.     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   Primiparous    Rosacea    Past Surgical History:  Procedure Laterality Date   atypical moles     bilateral salpingoopherectomy     for r 4cm clear r adnexal mass 05/11/2002 serous cystadenoma   CATARACT EXTRACTION     Both eyes   COLONOSCOPY     EYE SURGERY     OOPHORECTOMY     tonsilletomy  1953   Social History   Socioeconomic History   Marital status: Unknown    Spouse name: Not on file   Number of children: Not on file   Years of education: Not on file    Highest education level: Not on file  Occupational History   Occupation: Retired  Tobacco Use   Smoking status: Never   Smokeless tobacco: Never  Vaping Use   Vaping Use: Never used  Substance and Sexual Activity   Alcohol use: No   Drug use: No   Sexual activity: Not on file  Other Topics Concern   Not on file  Social History Narrative   hh of 1   No pets   No ets.   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      Mom age 58 friends home snif recently af TKR infection passed away 05/11/2015    Social Determinants of Health   Financial Resource Strain: Low Risk  (11/19/2020)   Overall Financial Resource Strain (CARDIA)    Difficulty of Paying Living Expenses: Not hard at all  Food Insecurity: No Food Insecurity (11/19/2020)   Hunger Vital Sign    Worried About Running Out of Food in the Last Year: Never true    Ran Out of Food in the Last Year: Never true  Transportation Needs: No Transportation Needs (11/19/2020)   PRAPARE - Hydrologist (Medical): No    Lack of Transportation (Non-Medical): No  Physical Activity: Insufficiently Active (11/19/2020)   Exercise Vital Sign    Days of Exercise per Week: 3 days    Minutes of Exercise per Session: 20 min  Stress: No Stress Concern Present (11/19/2020)   [place]    Feeling of Stress : Not at all  Social Connections: Socially Isolated (11/19/2020)   Social Connection and Isolation Panel [NHANES]    Frequency of Communication with Friends and Family: Twice a week    Frequency of Social Gatherings with Friends and Family: Twice a week    Attends Religious Services: Never    Marine scientist or Organizations: No    Attends Archivist Meetings: Never    Marital Status: Widowed   Allergies  Allergen Reactions   Penicillins     Gums swelled up and turned blue   Ace  Inhibitors Cough    Went away when med changed    Clindamycin/Lincomycin     Had rash with diflucan and clind but cant tell which one it was  As per dr Ernesto Rutherford   Sulfonamide Derivatives    Simvastatin Other (See Comments)    Body leg aches  On simva and doxy better off see July 17 note   Family History  Problem Relation Age of Onset   Atrial fibrillation Mother        had cv in her 9 s   Arthritis Mother        had staph infection knee replacment   Cancer Sister        thryoid   Fibromyalgia Brother    Colon cancer Neg Hx    Rectal cancer Neg Hx    Stomach cancer Neg Hx     Current Outpatient Medications (Endocrine & Metabolic):    levothyroxine (SYNTHROID) 25 MCG tablet, TAKE 1 TABLET BY MOUTH EVERY DAY BEFORE BREAKFAST  Current Outpatient Medications (Cardiovascular):    atorvastatin (LIPITOR) 10 MG tablet, TAKE 1 TABLET BY MOUTH EVERY DAY   EPINEPHrine 0.3 mg/0.3 mL IJ SOAJ injection, USE AS DIRECTED AS NEEDED   telmisartan-hydrochlorothiazide (MICARDIS HCT) 40-12.5 MG tablet, TAKE 1 TABLET BY MOUTH EVERY DAY  Current Outpatient Medications (Respiratory):    benzonatate (TESSALON PERLES) 100 MG capsule, 1-2 capsules up to twice daily as needed for cough   fluticasone (FLONASE) 50 MCG/ACT nasal spray, Place into both nostrils daily.   guaiFENesin (MUCINEX) 600 MG 12 hr tablet, Take by mouth 2 (two) times daily.   loratadine (CLARITIN) 10 MG tablet, Take 10 mg by mouth as needed.   promethazine-dextromethorphan (PROMETHAZINE-DM) 6.25-15 MG/5ML syrup, Take 5 mLs by mouth 4 (four) times daily as needed for cough.  Current Outpatient Medications (Analgesics):    acetaminophen (TYLENOL) 500 MG tablet, Take 1,000 mg by mouth every 6 (six) hours as needed.   ibuprofen (ADVIL,MOTRIN) 200 MG tablet, Take 200 mg by mouth every 6 (six) hours as needed.   Current Outpatient Medications (Other):    beta carotene w/minerals (OCUVITE) tablet, Take 1 tablet by mouth daily.   Calcium  Carbonate-Vitamin D 600-400 MG-UNIT tablet, Take 1 tablet by mouth daily.   co-enzyme Q-10 50 MG capsule, Take 150 mg by mouth daily.   doxycycline (VIBRA-TABS) 100 MG tablet, Take 1 tablet (100 mg total) by mouth 2 (two) times daily.   fluconazole (DIFLUCAN) 150 MG tablet, Take 1 tablet (150 mg total) by mouth daily.   ondansetron (ZOFRAN-ODT) 4 MG disintegrating tablet, Take 1 tablet (4 mg  total) by mouth every 8 (eight) hours as needed for nausea or vomiting (or migraine).   Turmeric 500 MG TABS, Take by mouth.     Objective  Blood pressure 126/84, pulse 77, height '5\' 5"'$  (1.651 m), weight 160 lb (72.6 kg), SpO2 97 %.   General: No apparent distress alert and oriented x3 mood and affect normal, dressed appropriately.  HEENT: Pupils equal, extraocular movements intact  Respiratory: Patient's speak in full sentences and does not appear short of breath  Cardiovascular: No lower extremity edema, non tender, no erythema  Back exam shows mild loss of lordosis.  Some mild tenderness noted in the lumbosacral area.  Nothing severe.  Patient is able to get out of a chair without any significant difficulty.  Negative straight leg test and neurovascularly intact.     Impression and Recommendations:     The above documentation has been reviewed and is accurate and complete Lyndal Pulley, DO

## 2021-10-08 ENCOUNTER — Ambulatory Visit: Payer: Medicare Other | Admitting: Family Medicine

## 2021-10-08 DIAGNOSIS — M545 Low back pain, unspecified: Secondary | ICD-10-CM

## 2021-10-08 NOTE — Assessment & Plan Note (Signed)
Arthritis noted in the back but is doing very well with conservative therapy at the moment.  Discussed icing regimen and home exercises.  Follow-up again in 3 months to recheck but encouraged patient to continue with his conservative therapy.

## 2021-10-08 NOTE — Patient Instructions (Signed)
Good to see you! Do the wall scoots 3 sets of 10

## 2021-10-29 ENCOUNTER — Telehealth: Payer: Self-pay | Admitting: Internal Medicine

## 2021-10-29 NOTE — Telephone Encounter (Signed)
Patient wants it noted in chart that she received her high dose flu shot on 9/10 from [facility].      FYI

## 2021-11-06 NOTE — Telephone Encounter (Signed)
Updated in pt chart.

## 2021-11-25 ENCOUNTER — Ambulatory Visit (INDEPENDENT_AMBULATORY_CARE_PROVIDER_SITE_OTHER): Payer: Medicare Other

## 2021-11-25 VITALS — Ht 65.0 in | Wt 152.0 lb

## 2021-11-25 DIAGNOSIS — Z0000 Encounter for general adult medical examination without abnormal findings: Secondary | ICD-10-CM | POA: Diagnosis not present

## 2021-11-25 NOTE — Progress Notes (Signed)
I connected with Cheryl Robbins today by telephone and verified that I am speaking with the correct person using two identifiers. Location patient: home Location provider: work Persons participating in the virtual visit: Cheryl Robbins, Glenna Durand LPN.   I discussed the limitations, risks, security and privacy concerns of performing an evaluation and management service by telephone and the availability of in person appointments. I also discussed with the patient that there may be a patient responsible charge related to this service. The patient expressed understanding and verbally consented to this telephonic visit.    Interactive audio and video telecommunications were attempted between this provider and patient, however failed, due to patient having technical difficulties OR patient did not have access to video capability.  We continued and completed visit with audio only.     Vital signs may be patient reported or missing.  Subjective:   Cheryl Robbins is a 76 y.o. female who presents for Medicare Annual (Subsequent) preventive examination.  Review of Systems     Cardiac Risk Factors include: advanced age (>22mn, >>75women);dyslipidemia;hypertension     Objective:    Today's Vitals   11/25/21 1526  Weight: 152 lb (68.9 kg)  Height: '5\' 5"'$  (1.651 m)   Body mass index is 25.29 kg/m.     11/25/2021    3:32 PM 11/19/2020   11:34 AM 10/19/2019   10:31 AM  Advanced Directives  Does Patient Have a Medical Advance Directive? Yes Yes No  Type of AParamed[place]of [place]ort [place] will [place] will   Copy of [place]? No - copy requested No - copy requested   Would patient like information on creating a medical advance directive?   No - Patient declined    Current Medications (verified) Outpatient Encounter Medications as of 11/25/2021  Medication Sig   acetaminophen (TYLENOL) 500 MG tablet Take 1,000  mg by mouth every 6 (six) hours as needed.   atorvastatin (LIPITOR) 10 MG tablet TAKE 1 TABLET BY MOUTH EVERY DAY   benzonatate (TESSALON PERLES) 100 MG capsule 1-2 capsules up to twice daily as needed for cough   beta carotene w/minerals (OCUVITE) tablet Take 1 tablet by mouth daily.   Calcium Carbonate-Vitamin D 600-400 MG-UNIT tablet Take 1 tablet by mouth daily.   co-enzyme Q-10 50 MG capsule Take 150 mg by mouth daily.   EPINEPHrine 0.3 mg/0.3 mL IJ SOAJ injection USE AS DIRECTED AS NEEDED   fluticasone (FLONASE) 50 MCG/ACT nasal spray Place into both nostrils daily.   guaiFENesin (MUCINEX) 600 MG 12 hr tablet Take by mouth 2 (two) times daily.   ibuprofen (ADVIL,MOTRIN) 200 MG tablet Take 200 mg by mouth every 6 (six) hours as needed.   levothyroxine (SYNTHROID) 25 MCG tablet TAKE 1 TABLET BY MOUTH EVERY DAY BEFORE BREAKFAST   loratadine (CLARITIN) 10 MG tablet Take 10 mg by mouth as needed.   ondansetron (ZOFRAN-ODT) 4 MG disintegrating tablet Take 1 tablet (4 mg total) by mouth every 8 (eight) hours as needed for nausea or vomiting (or migraine).   TART CHERRY PO Take by mouth.   telmisartan-hydrochlorothiazide (MICARDIS HCT) 40-12.5 MG tablet TAKE 1 TABLET BY MOUTH EVERY DAY   Turmeric 500 MG TABS Take by mouth.   doxycycline (VIBRA-TABS) 100 MG tablet Take 1 tablet (100 mg total) by mouth 2 (two) times daily.   fluconazole (DIFLUCAN) 150 MG tablet Take 1 tablet (150 mg total) by mouth daily. (Patient not taking: Reported on  11/25/2021)   promethazine-dextromethorphan (PROMETHAZINE-DM) 6.25-15 MG/5ML syrup Take 5 mLs by mouth 4 (four) times daily as needed for cough. (Patient not taking: Reported on 11/25/2021)   No facility-administered encounter medications on file as of 11/25/2021.    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   CATARACT EXTRACTION     Both eyes   COLONOSCOPY     EYE SURGERY     OOPHORECTOMY     tonsilletomy  1953   Family History  Problem Relation Age of Onset   Atrial fibrillation Mother        had cv in her 95 s   Arthritis Mother        had staph infection knee replacment   Cancer Sister        thryoid   Fibromyalgia Brother    Colon cancer Neg Hx    Rectal cancer Neg Hx    Stomach cancer Neg Hx    Social History   Socioeconomic History   Marital status: Unknown    Spouse name: Not on file   Number of children: Not on file   Years of education: Not on file   Highest education level: Not on file  Occupational History   Occupation: Retired  Tobacco Use   Smoking status: Never   Smokeless tobacco: Never  Vaping Use   Vaping Use: Never used  Substance and Sexual Activity   Alcohol use: No   Drug use: No   Sexual activity: Not on file  Other Topics Concern   Not on file  Social History Narrative   hh of 1   No pets   No ets.   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      Mom age 49 friends home snif recently af TKR infection passed away May 18, 2015    Social Determinants of Health   Financial Resource Strain: Low Risk  (11/25/2021)   Overall Financial Resource Strain (CARDIA)    Difficulty of Paying Living Expenses: Not hard at all  Food Insecurity: No Food Insecurity (11/25/2021)   Hunger Vital Sign    Worried About Running Out of Food in the Last Year: Never true    Ran Out of Food in the Last Year: Never true   Transportation Needs: No Transportation Needs (11/25/2021)   PRAPARE - Hydrologist (Medical): No    Lack of Transportation (Non-Medical): No  Physical Activity: Insufficiently Active (11/25/2021)   Exercise Vital Sign    Days of Exercise per Week: 4 days    Minutes of Exercise per Session: 30 min  Stress: No Stress Concern Present (11/25/2021)   [place]    Feeling of Stress : Not at all  Social Connections: Socially Isolated (11/19/2020)   Social  Connection and Isolation Panel [NHANES]    Frequency of Communication with Friends and Family: Twice a week    Frequency of Social Gatherings with Friends and Family: Twice a week    Attends Religious Services: Never    Marine scientist or Organizations: No    Attends Archivist Meetings: Never    Marital Status: Widowed    Tobacco Counseling Counseling given: Not Answered   Clinical Intake:  Pre-visit preparation completed: Yes  Pain : No/denies pain     Nutritional Status: BMI 25 -29 Overweight Nutritional Risks: None Diabetes: No  How often do you need to have someone help you when you read instructions, pamphlets, or other written materials from your doctor or pharmacy?: 1 - Never What is the last grade level you completed in school?: beyong bachelors degree  Diabetic? no  Interpreter Needed?: No  Information entered by :: NAllen LPN   Activities of Daily Living    11/25/2021    3:33 PM  In your present state of health, do you have any difficulty performing the following activities:  Hearing? 0  Vision? 0  Difficulty concentrating or making decisions? 0  Walking or climbing stairs? 0  Dressing or bathing? 0  Doing errands, shopping? 0  Preparing Food and eating ? N  Using the Toilet? N  In the past six months, have you accidently leaked urine? N  Do you have problems with loss of bowel control? N   Managing your Medications? N  Managing your Finances? N  Housekeeping or managing your Housekeeping? N    Patient Care Team: Panosh, Standley Brooking, MD as PCP - General Ernesto Rutherford Maudry Mayhew, MD (Otolaryngology) Allyn Kenner, MD (Dermatology) Latanya Maudlin, MD (Orthopedic Surgery) ed Eddie Dibbles (Ophthalmology) Tiajuana Amass, MD as Consulting Physician (Allergy and Immunology)  Indicate any recent Medical Services you may have received from other than [facility] providers in the past year (date may be approximate).     Assessment:   This is a routine wellness examination for Dottie.  Hearing/Vision screen Vision Screening - Comments:: Regular eye exams, Dr. Ellie Lunch  Dietary issues and exercise activities discussed: Current Exercise Habits: Home exercise routine, Type of exercise: walking, Time (Minutes): 30, Frequency (Times/Week): 4, Weekly Exercise (Minutes/Week): 120   Goals Addressed             This Visit's Progress    Patient Stated       11/25/2021, wants to maintain       Depression Screen    11/25/2021    3:33 PM 01/14/2021    9:04 AM 11/19/2020   11:35 AM 01/10/2020    1:40 PM 01/10/2020   10:35 AM 10/19/2019   10:30 AM 12/22/2018    3:05 PM  PHQ 2/9 Scores  PHQ - 2 Score 0 0 0 0 0 0 0  PHQ- 9 Score    0       Fall Risk    11/25/2021    3:33 PM 11/19/2020   11:35 AM 01/10/2020   10:35 AM 10/19/2019   10:32 AM 12/22/2018    3:05 PM  [place] in the past year? 0 0 0 0 0  Number falls in past yr: 0 0  0 0  Injury with Fall? 0 0  0 0  Risk for fall due to : No Fall Risks;Medication side effect   Impaired vision   Follow up Falls prevention discussed;Falls evaluation completed;Education provided Falls evaluation completed  Falls prevention  discussed Falls evaluation completed    FALL RISK PREVENTION PERTAINING TO THE HOME:  Any stairs in or around the home? Yes  If so, are there any without handrails? No  Home free of loose throw rugs in walkways, pet beds,  electrical cords, etc? Yes  Adequate lighting in your home to reduce risk of falls? Yes   ASSISTIVE DEVICES UTILIZED TO PREVENT FALLS:  Life alert? No  Use of a cane, walker or w/c? No  Grab bars in the bathroom? No  Shower chair or bench in shower? No  Elevated toilet seat or a handicapped toilet? No   TIMED UP AND GO:  Was the test performed? No .      Cognitive Function:        11/25/2021    3:34 PM 10/19/2019   10:35 AM  6CIT Screen  What Year? 0 points 0 points  What month? 0 points 0 points  What time? 0 points   Count back from 20 0 points 0 points  Months in reverse 0 points 0 points  Repeat phrase 0 points 0 points  Total Score 0 points     Immunizations Immunization History  Administered Date(s) Administered   Fluad Quad(high Dose 65+) 11/20/2018, 11/30/2020, 10/27/2021   Influenza Split 11/26/2010, 12/05/2011   Influenza Whole 11/27/2006, 12/01/2007, 11/29/2008   Influenza, High Dose Seasonal PF 12/06/2013, 12/28/2013, 11/06/2014, 01/08/2015, 11/25/2015, 11/22/2016, 02/23/2017, 11/16/2017   Influenza,inj,Quad PF,6+ Mos 10/21/2012   Influenza-Unspecified 11/22/2016, 11/25/2019   PFIZER(Purple Top)SARS-COV-2 Vaccination 03/27/2019, 04/20/2019, 10/03/2019, 12/06/2019, 07/14/2020   Pfizer Covid-19 Vaccine Bivalent Booster 56yr & up 11/15/2020   Pneumococcal Conjugate-13 06/24/2013   Pneumococcal Polysaccharide-23 12/04/2009, 12/28/2013, 02/23/2017   Td 07/30/2006   Tdap 08/22/2015   Unspecified SARS-COV-2 Vaccination 11/02/2021   Zoster Recombinat (Shingrix) 01/03/2017, 03/10/2017   Zoster, Live 02/19/2009    TDAP status: Up to date  Flu Vaccine status: Up to date  Pneumococcal vaccine status: Up to date  Covid-19 vaccine status: Completed vaccines  Qualifies for Shingles Vaccine? Yes   Zostavax completed Yes   Shingrix Completed?: Yes  Screening Tests Health Maintenance  Topic Date Due   COVID-19 Vaccine (8 - Pfizer risk series) 12/28/2021    TETANUS/TDAP  08/21/2025   Pneumonia Vaccine 77 Years old  Completed   INFLUENZA VACCINE  Completed   DEXA SCAN  Completed   Hepatitis C Screening  Completed   Zoster Vaccines- Shingrix  Completed   HPV VACCINES  Aged Out   COLONOSCOPY (Pts 45-459yrInsurance coverage will need to be confirmed)  Discontinued    Health Maintenance  There are no preventive care reminders to display for this patient.   Colorectal cancer screening: Type of screening: Colonoscopy. Completed 02/25/2013. Repeat every 10 years  Mammogram status: Completed 01/15/2021. Repeat every year  Bone Density status: Completed 01/23/2020.   Lung Cancer Screening: (Low Dose CT Chest recommended if Age 77-80ears, 30 pack-year currently smoking OR have quit w/in 15years.) does not qualify.   Lung Cancer Screening Referral: no  Additional Screening:  Hepatitis C Screening: does qualify; Completed 08/22/2015  Vision Screening: Recommended annual ophthalmology exams for early detection of glaucoma and other disorders of the eye. Is the patient up to date with their annual eye exam?  Yes  Who is the provider or what is the name of the office in which the patient attends annual eye exams? Dr. McEllie Lunchf pt is not established with a provider, would they like to be referred to a provider to  establish care? No .   Dental Screening: Recommended annual dental exams for proper oral hygiene  Community Resource Referral / Chronic Care Management: CRR required this visit?  No   CCM required this visit?  No      Plan:     I have personally reviewed and noted the following in the patient's chart:   Medical and social history Use of alcohol, tobacco or illicit drugs  Current medications and supplements including opioid prescriptions. Patient is not currently taking opioid prescriptions. Functional ability and status Nutritional status Physical activity Advanced directives List of other physicians Hospitalizations,  surgeries, and ER visits in previous 12 months Vitals Screenings to include cognitive, depression, and falls Referrals and appointments  In addition, I have reviewed and discussed with patient certain preventive protocols, quality metrics, and best practice recommendations. A written personalized care plan for preventive services as well as general preventive health recommendations were provided to patient.     Kellie Simmering, LPN   25/10/5636   Nurse Notes: none  Due to this being a virtual visit, the after visit summary with patients personalized plan was offered to patient via mail or my-chart. Patient would like to access on my-chart

## 2021-11-25 NOTE — Patient Instructions (Signed)
Cheryl Robbins , Thank you for taking time to come for your Medicare Wellness Visit. I appreciate your ongoing commitment to your health goals. Please review the following plan we discussed and let me know if I can assist you in the future.   Screening recommendations/referrals: Colonoscopy: completed 02/25/2013 Mammogram: completed 01/15/2021, due 01/16/2022 Bone Density: completed 01/23/2020 Recommended yearly ophthalmology/optometry visit for glaucoma screening and checkup Recommended yearly dental visit for hygiene and checkup  Vaccinations: Influenza vaccine: completed 10/27/2021 Pneumococcal vaccine: completed 02/23/2017 Tdap vaccine: completed 08/22/2015, due 08/21/2025 Shingles vaccine: completed   Covid-19: 11/02/2021, 11/15/2020, 07/14/2020, 12/06/2019, 10/03/2019, 04/20/2019, 03/27/2019  Advanced directives: Please bring a copy of your POA (Power of Attorney) and/or Living Will to your next appointment.   Conditions/risks identified: none  Next appointment: Follow up in one year for your annual wellness visit    Preventive Care 65 Years and Older, Female Preventive care refers to lifestyle choices and visits with your health care provider that can promote health and wellness. What does preventive care include? A yearly physical exam. This is also called an annual well check. Dental exams once or twice a year. Routine eye exams. Ask your health care provider how often you should have your eyes checked. Personal lifestyle choices, including: Daily care of your teeth and gums. Regular physical activity. Eating a healthy diet. Avoiding tobacco and drug use. Limiting alcohol use. Practicing safe sex. Taking low-dose aspirin every day. Taking vitamin and mineral supplements as recommended by your health care provider. What happens during an annual well check? The services and screenings done by your health care provider during your annual well check will depend on your age, overall health,  lifestyle risk factors, and family history of disease. Counseling  Your health care provider may ask you questions about your: Alcohol use. Tobacco use. Drug use. Emotional well-being. Home and relationship well-being. Sexual activity. Eating habits. History of falls. Memory and ability to understand (cognition). Work and work Statistician. Reproductive health. Screening  You may have the following tests or measurements: Height, weight, and BMI. Blood pressure. Lipid and cholesterol levels. These may be checked every 5 years, or more frequently if you are over 22 years old. Skin check. Lung cancer screening. You may have this screening every year starting at age 84 if you have a 30-pack-year history of smoking and currently smoke or have quit within the past 15 years. Fecal occult blood test (FOBT) of the stool. You may have this test every year starting at age 59. Flexible sigmoidoscopy or colonoscopy. You may have a sigmoidoscopy every 5 years or a colonoscopy every 10 years starting at age 33. Hepatitis C blood test. Hepatitis B blood test. Sexually transmitted disease (STD) testing. Diabetes screening. This is done by checking your blood sugar (glucose) after you have not eaten for a while (fasting). You may have this done every 1-3 years. Bone density scan. This is done to screen for osteoporosis. You may have this done starting at age 68. Mammogram. This may be done every 1-2 years. Talk to your health care provider about how often you should have regular mammograms. Talk with your health care provider about your test results, treatment options, and if necessary, the need for more tests. Vaccines  Your health care provider may recommend certain vaccines, such as: Influenza vaccine. This is recommended every year. Tetanus, diphtheria, and acellular pertussis (Tdap, Td) vaccine. You may need a Td booster every 10 years. Zoster vaccine. You may need this after age  34.  Pneumococcal 13-valent conjugate (PCV13) vaccine. One dose is recommended after age 66. Pneumococcal polysaccharide (PPSV23) vaccine. One dose is recommended after age 80. Talk to your health care provider about which screenings and vaccines you need and how often you need them. This information is not intended to replace advice given to you by your health care provider. Make sure you discuss any questions you have with your health care provider. Document Released: 03/02/2015 Document Revised: 10/24/2015 Document Reviewed: 12/05/2014 Elsevier Interactive Patient Education  2017 [place] Prevention in the Home Falls can cause injuries. They can happen to people of all ages. There are many things you can do to make your home safe and to help prevent falls. What can I do on the outside of my home? Regularly fix the edges of walkways and driveways and fix any cracks. Remove anything that might make you trip as you walk through a door, such as a raised step or threshold. Trim any bushes or trees on the path to your home. Use bright outdoor lighting. Clear any walking paths of anything that might make someone trip, such as rocks or tools. Regularly check to see if handrails are loose or broken. Make sure that both sides of any steps have handrails. Any raised decks and porches should have guardrails on the edges. Have any leaves, snow, or ice cleared regularly. Use sand or salt on walking paths during winter. Clean up any spills in your garage right away. This includes oil or grease spills. What can I do in the bathroom? Use night lights. Install grab bars by the toilet and in the tub and shower. Do not use towel bars as grab bars. Use non-skid mats or decals in the tub or shower. If you need to sit down in the shower, use a plastic, non-slip stool. Keep the floor dry. Clean up any water that spills on the floor as soon as it happens. Remove soap buildup in the tub or shower  regularly. Attach bath mats securely with double-sided non-slip rug tape. Do not have throw rugs and other things on the floor that can make you trip. What can I do in the bedroom? Use night lights. Make sure that you have a light by your bed that is easy to reach. Do not use any sheets or blankets that are too big for your bed. They should not hang down onto the floor. Have a firm chair that has side arms. You can use this for support while you get dressed. Do not have throw rugs and other things on the floor that can make you trip. What can I do in the kitchen? Clean up any spills right away. Avoid walking on wet floors. Keep items that you use a lot in easy-to-reach places. If you need to reach something above you, use a strong step stool that has a grab bar. Keep electrical cords out of the way. Do not use floor polish or wax that makes floors slippery. If you must use wax, use non-skid floor wax. Do not have throw rugs and other things on the floor that can make you trip. What can I do with my stairs? Do not leave any items on the stairs. Make sure that there are handrails on both sides of the stairs and use them. Fix handrails that are broken or loose. Make sure that handrails are as long as the stairways. Check any carpeting to make sure that it is firmly attached to the stairs. Fix any carpet  that is loose or worn. Avoid having throw rugs at the top or bottom of the stairs. If you do have throw rugs, attach them to the floor with carpet tape. Make sure that you have a light switch at the top of the stairs and the bottom of the stairs. If you do not have them, ask someone to add them for you. What else can I do to help prevent falls? Wear shoes that: Do not have high heels. Have rubber bottoms. Are comfortable and fit you well. Are closed at the toe. Do not wear sandals. If you use a stepladder: Make sure that it is fully opened. Do not climb a closed stepladder. Make sure that  both sides of the stepladder are locked into place. Ask someone to hold it for you, if possible. Clearly mark and make sure that you can see: Any grab bars or handrails. First and last steps. Where the edge of each step is. Use tools that help you move around (mobility aids) if they are needed. These include: Canes. Walkers. Scooters. Crutches. Turn on the lights when you go into a dark area. Replace any light bulbs as soon as they burn out. Set up your furniture so you have a clear path. Avoid moving your furniture around. If any of your floors are uneven, fix them. If there are any pets around you, be aware of where they are. Review your medicines with your doctor. Some medicines can make you feel dizzy. This can increase your chance of falling. Ask your doctor what other things that you can do to help prevent falls. This information is not intended to replace advice given to you by your health care provider. Make sure you discuss any questions you have with your health care provider. Document Released: 11/30/2008 Document Revised: 07/12/2015 Document Reviewed: 03/10/2014 Elsevier Interactive Patient Education  2017 [employer].

## 2021-12-23 ENCOUNTER — Other Ambulatory Visit: Payer: Self-pay | Admitting: Family Medicine

## 2022-01-01 ENCOUNTER — Other Ambulatory Visit: Payer: Self-pay | Admitting: Internal Medicine

## 2022-01-01 DIAGNOSIS — E039 Hypothyroidism, unspecified: Secondary | ICD-10-CM

## 2022-01-06 ENCOUNTER — Ambulatory Visit: Payer: Medicare Other | Admitting: Family Medicine

## 2022-01-08 NOTE — Progress Notes (Unsigned)
[place] Phone: [phone] Subjective:   Cheryl Robbins, am serving as a scribe for Dr. Hulan Saas.  I'm seeing this patient by the request  of:  Panosh, Standley Brooking, MD  CC: Low back pain  UXN:[id number]  10/08/2021 Arthritis noted in the back but is doing very well with conservative therapy at the moment.  Discussed icing regimen and home exercises.  Follow-up again in 3 months to recheck but encouraged patient to continue with his conservative therapy.   Update 01/15/2022 Cheryl Robbins is a 76 y.o. female coming in with complaint of lumbar spine pain. Patient states that her back is doing well.   For past 2 weeks patient has had increase in tendon size near ring ringer of R hand. Not painful and finger is not catching. Believes she had something similar happen on other hand.   Patient also c/o cyst near distal olecranon of L elbow.    Patient's lumbar spine x-rays were independently visualized by me today showing that there is multilevel degenerative disc disease with scoliosis noted.    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   Primiparous    Rosacea    Past Surgical History:  Procedure Laterality Date   atypical moles     bilateral salpingoopherectomy     for r 4cm clear r adnexal mass 2002/05/01 serous cystadenoma   CATARACT EXTRACTION     Both eyes   COLONOSCOPY     EYE SURGERY     OOPHORECTOMY     tonsilletomy  1953   Social History   Socioeconomic History   Marital status: Unknown    Spouse name: Not on file   Number of children: Not on file   Years of education: Not on file   Highest education level: Not on file  Occupational History   Occupation: Retired  Tobacco  Use   Smoking status: Never   Smokeless tobacco: Never  Vaping Use   Vaping Use: Never used  Substance and Sexual Activity   Alcohol use: Robbins   Drug use: Robbins   Sexual activity: Not on file  Other Topics Concern   Not on file  Social History Narrative   hh of 1   Robbins pets   Robbins ets.   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      Mom age 2 friends home snif recently af TKR infection passed away 2015-05-01    Social Determinants of Health   Financial Resource Strain: Low Risk  (11/25/2021)   Overall Financial Resource Strain (CARDIA)    Difficulty of Paying Living Expenses: Not hard at all  Food Insecurity: Robbins Food Insecurity (11/25/2021)   Hunger Vital Sign    Worried About Running Out of Food in the Last Year: Never true    Ran Out of Food in the Last Year: Never true  Transportation Needs: Robbins Transportation Needs (11/25/2021)   PRAPARE - Hydrologist (Medical): Robbins    Lack of Transportation (Non-Medical): Robbins  Physical Activity: Insufficiently Active (11/25/2021)   Exercise  Vital Sign    Days of Exercise per Week: 4 days    Minutes of Exercise per Session: 30 min  Stress: Robbins Stress Concern Present (11/25/2021)   [place]    Feeling of Stress : Not at all  Social Connections: Socially Isolated (11/19/2020)   Social Connection and Isolation Panel [NHANES]    Frequency of Communication with Friends and Family: Twice a week    Frequency of Social Gatherings with Friends and Family: Twice a week    Attends Religious Services: Never    Marine scientist or Organizations: Robbins    Attends Archivist Meetings: Never    Marital Status: Widowed   Allergies  Allergen Reactions   Penicillins     Gums swelled up and turned blue   Ace Inhibitors Cough    Went away when med changed    Clindamycin/Lincomycin     Had rash with  diflucan and clind but cant tell which one it was  As per dr Ernesto Rutherford   Sulfonamide Derivatives    Simvastatin Other (See Comments)    Body leg aches  On simva and doxy better off see July 17 note   Family History  Problem Relation Age of Onset   Atrial fibrillation Mother        had cv in her 73 s   Arthritis Mother        had staph infection knee replacment   Cancer Sister        thryoid   Fibromyalgia Brother    Colon cancer Neg Hx    Rectal cancer Neg Hx    Stomach cancer Neg Hx     Current Outpatient Medications (Endocrine & Metabolic):    levothyroxine (SYNTHROID) 25 MCG tablet, TAKE 1 TABLET BY MOUTH EVERY DAY BEFORE breakfast  Current Outpatient Medications (Cardiovascular):    atorvastatin (LIPITOR) 10 MG tablet, TAKE 1 TABLET BY MOUTH EVERY DAY   EPINEPHrine 0.3 mg/0.3 mL IJ SOAJ injection, USE AS DIRECTED AS NEEDED   telmisartan-hydrochlorothiazide (MICARDIS HCT) 40-12.5 MG tablet, TAKE 1 TABLET BY MOUTH EVERY DAY  Current Outpatient Medications (Respiratory):    benzonatate (TESSALON PERLES) 100 MG capsule, 1-2 capsules up to twice daily as needed for cough   fluticasone (FLONASE) 50 MCG/ACT nasal spray, Place into both nostrils daily.   guaiFENesin (MUCINEX) 600 MG 12 hr tablet, Take by mouth 2 (two) times daily.   loratadine (CLARITIN) 10 MG tablet, Take 10 mg by mouth as needed.   promethazine-dextromethorphan (PROMETHAZINE-DM) 6.25-15 MG/5ML syrup, Take 5 mLs by mouth 4 (four) times daily as needed for cough.  Current Outpatient Medications (Analgesics):    acetaminophen (TYLENOL) 500 MG tablet, Take 1,000 mg by mouth every 6 (six) hours as needed.   ibuprofen (ADVIL,MOTRIN) 200 MG tablet, Take 200 mg by mouth every 6 (six) hours as needed.   Current Outpatient Medications (Other):    beta carotene w/minerals (OCUVITE) tablet, Take 1 tablet by mouth daily.   Calcium Carbonate-Vitamin D 600-400 MG-UNIT tablet, Take 1 tablet by mouth daily.   co-enzyme Q-10 50  MG capsule, Take 150 mg by mouth daily.   fluconazole (DIFLUCAN) 150 MG tablet, Take 1 tablet (150 mg total) by mouth daily.   ondansetron (ZOFRAN-ODT) 4 MG disintegrating tablet, Take 1 tablet (4 mg total) by mouth every 8 (eight) hours as needed for nausea or vomiting (or migraine).   TART CHERRY PO, Take by mouth.   Turmeric 500  MG TABS, Take by mouth.   doxycycline (VIBRA-TABS) 100 MG tablet, Take 1 tablet (100 mg total) by mouth 2 (two) times daily.    R  Objective  Blood pressure 120/82, pulse 85, height '5\' 5"'$  (1.651 m), weight 160 lb (72.6 kg), SpO2 98 %.   General: Robbins apparent distress alert and oriented x3 mood and affect normal, dressed appropriately.  HEENT: Pupils equal, extraocular movements intact  Respiratory: Patient's speak in full sentences and does not appear short of breath  Cardiovascular: Robbins lower extremity edema, non tender, Robbins erythema  Patient is sitting comfortably overall.  Nontender over the back itself.  Does have some mild tenderness to palpation noted.  Patient does have some crepitus of the knee. Still some difficulty posture.    Impression and Recommendations:    The above documentation has been reviewed and is accurate and complete Lyndal Pulley, DO

## 2022-01-15 ENCOUNTER — Encounter: Payer: Self-pay | Admitting: Family Medicine

## 2022-01-15 ENCOUNTER — Ambulatory Visit: Payer: Medicare Other | Admitting: Family Medicine

## 2022-01-15 VITALS — BP 120/82 | HR 85 | Ht 65.0 in | Wt 160.0 lb

## 2022-01-15 DIAGNOSIS — M545 Low back pain, unspecified: Secondary | ICD-10-CM

## 2022-01-15 NOTE — Patient Instructions (Signed)
Good to see you! Glad you're doing better, keep up the hard work!  Upright Go will help you with posture. You can get it on [employer].   See me again in 3 months if you need me.

## 2022-01-15 NOTE — Assessment & Plan Note (Signed)
Low back does have some low back pain. Still doing well, discussed HEP doing well at this time, no other changes, f/u prn

## 2022-01-16 LAB — HM MAMMOGRAPHY

## 2022-01-22 ENCOUNTER — Encounter: Payer: Medicare Other | Admitting: Internal Medicine

## 2022-02-03 ENCOUNTER — Encounter: Payer: Self-pay | Admitting: Internal Medicine

## 2022-02-19 ENCOUNTER — Encounter: Payer: Medicare Other | Admitting: Internal Medicine

## 2022-02-25 ENCOUNTER — Encounter: Payer: Medicare Other | Admitting: Internal Medicine

## 2022-03-19 ENCOUNTER — Other Ambulatory Visit: Payer: Self-pay | Admitting: Family Medicine

## 2022-04-02 ENCOUNTER — Other Ambulatory Visit: Payer: Self-pay | Admitting: Internal Medicine

## 2022-04-02 DIAGNOSIS — E039 Hypothyroidism, unspecified: Secondary | ICD-10-CM

## 2022-04-03 ENCOUNTER — Encounter: Payer: Self-pay | Admitting: Internal Medicine

## 2022-04-03 ENCOUNTER — Ambulatory Visit (INDEPENDENT_AMBULATORY_CARE_PROVIDER_SITE_OTHER): Payer: Medicare Other | Admitting: Internal Medicine

## 2022-04-03 VITALS — BP 154/92 | HR 77 | Temp 98.2°F | Ht 63.5 in | Wt 159.0 lb

## 2022-04-03 DIAGNOSIS — E785 Hyperlipidemia, unspecified: Secondary | ICD-10-CM

## 2022-04-03 DIAGNOSIS — Z79899 Other long term (current) drug therapy: Secondary | ICD-10-CM

## 2022-04-03 DIAGNOSIS — R739 Hyperglycemia, unspecified: Secondary | ICD-10-CM

## 2022-04-03 DIAGNOSIS — I10 Essential (primary) hypertension: Secondary | ICD-10-CM

## 2022-04-03 DIAGNOSIS — Z0000 Encounter for general adult medical examination without abnormal findings: Secondary | ICD-10-CM

## 2022-04-03 DIAGNOSIS — E039 Hypothyroidism, unspecified: Secondary | ICD-10-CM

## 2022-04-03 DIAGNOSIS — E2839 Other primary ovarian failure: Secondary | ICD-10-CM

## 2022-04-03 LAB — CBC WITH DIFFERENTIAL/PLATELET
Basophils Absolute: 0 10*3/uL (ref 0.0–0.1)
Basophils Relative: 0.4 % (ref 0.0–3.0)
Eosinophils Absolute: 0.2 10*3/uL (ref 0.0–0.7)
Eosinophils Relative: 5 % (ref 0.0–5.0)
HCT: 45.2 % (ref 36.0–46.0)
Hemoglobin: 14.9 g/dL (ref 12.0–15.0)
Lymphocytes Relative: 22.8 % (ref 12.0–46.0)
Lymphs Abs: 1.1 10*3/uL (ref 0.7–4.0)
MCHC: 32.9 g/dL (ref 30.0–36.0)
MCV: 84.7 fl (ref 78.0–100.0)
Monocytes Absolute: 0.4 10*3/uL (ref 0.1–1.0)
Monocytes Relative: 7.5 % (ref 3.0–12.0)
Neutro Abs: 3.1 10*3/uL (ref 1.4–7.7)
Neutrophils Relative %: 64.3 % (ref 43.0–77.0)
Platelets: 171 10*3/uL (ref 150.0–400.0)
RBC: 5.33 Mil/uL — ABNORMAL HIGH (ref 3.87–5.11)
RDW: 14.1 % (ref 11.5–15.5)
WBC: 4.9 10*3/uL (ref 4.0–10.5)

## 2022-04-03 LAB — LIPID PANEL
Cholesterol: 155 mg/dL (ref 0–200)
HDL: 45.2 mg/dL (ref >39.00)
LDL Cholesterol: 92 mg/dL (ref 0–99)
NonHDL: 109.89
Total CHOL/HDL Ratio: 3
Triglycerides: 89 mg/dL (ref 0.0–149.0)
VLDL: 17.8 mg/dL (ref 0.0–40.0)

## 2022-04-03 LAB — COMPREHENSIVE METABOLIC PANEL
ALT: 16 U/L (ref 0–35)
AST: 13 U/L (ref 0–37)
Albumin: 4 g/dL (ref 3.5–5.2)
Alkaline Phosphatase: 60 U/L (ref 39–117)
BUN: 21 mg/dL (ref 6–23)
CO2: 30 mEq/L (ref 19–32)
Calcium: 9.4 mg/dL (ref 8.4–10.5)
Chloride: 99 mEq/L (ref 96–112)
Creatinine, Ser: 0.87 mg/dL (ref 0.40–1.20)
GFR: 64.22 mL/min (ref >60.00)
Glucose, Bld: 99 mg/dL (ref 70–99)
Potassium: 4.5 mEq/L (ref 3.5–5.1)
Sodium: 138 mEq/L (ref 135–145)
Total Bilirubin: 0.6 mg/dL (ref 0.2–1.2)
Total Protein: 6.4 g/dL (ref 6.0–8.3)

## 2022-04-03 LAB — TSH: TSH: 1.83 u[IU]/mL (ref 0.35–5.50)

## 2022-04-03 LAB — HEMOGLOBIN A1C: Hgb A1c MFr Bld: 5.6 % (ref 4.6–6.5)

## 2022-04-03 MED ORDER — TELMISARTAN-HCTZ 40-12.5 MG PO TABS: 1.0000 | ORAL_TABLET | Freq: Every day | ORAL | 3 refills | Status: DC

## 2022-04-03 MED ORDER — LEVOTHYROXINE SODIUM 25 MCG PO TABS: ORAL_TABLET | ORAL | 3 refills | Status: DC

## 2022-04-03 MED ORDER — ATORVASTATIN CALCIUM 10 MG PO TABS: 10.0000 mg | ORAL_TABLET | Freq: Every day | ORAL | 3 refills | Status: DC

## 2022-04-03 NOTE — Progress Notes (Signed)
Chief Complaint  Patient presents with   Annual Exam    HPI: Patient  Cheryl Robbins  77 y.o. comes in today for Preventive Health Care visit  And med management   BP  saw dr Tamala Julian attention t odiet but not this week. Check area of lipoma  ? If swollen again. 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 HLD  atorvastatin     Health Maintenance  Topic Date Due   COVID-19 Vaccine (8 - 2023-24 season) 12/30/2021   Medicare Annual Wellness (AWV)  11/26/2022   DTaP/Tdap/Td (3 - Td or Tdap) 08/21/2025   Pneumonia Vaccine 45+ Years old  Completed   INFLUENZA VACCINE  Completed   DEXA SCAN  Completed   Hepatitis C Screening  Completed   Zoster Vaccines- Shingrix  Completed   HPV VACCINES  Aged Out   COLONOSCOPY (Pts 45-15yr Insurance coverage will need to be confirmed)  Discontinued   Health Maintenance Review LIFESTYLE:  Exercise:  walking and activity  Tobacco/ETS: no Alcohol:  no Sugar beverages: small cokes ocass  Sleep: 7-8 hours  Drug use: no HH of  1      ROS:  GEN/ HEENT: No fever, significant weight changes sweats headaches vision problems hearing changes, CV/ PULM; No chest pain shortness of breath cough, syncope,edema  change in exercise tolerance. GI /GU: No adominal pain, vomiting, change in bowel habits. No blood in the stool. No significant GU symptoms. SKIN/HEME: ,no acute skin rashes suspicious lesions or bleeding. No lymphadenopathy, nodules, masses.  NEURO/ PSYCH:  No new  neurologic signs . No depression anxiety. IMM/ Allergy: No unusual infections.  Allergy .   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   Primiparous    Rosacea     Past Surgical History:   Procedure Laterality Date   atypical moles     bilateral salpingoopherectomy     for r 4cm clear r adnexal mass 2004 serous cystadenoma   CATARACT EXTRACTION     Both eyes   COLONOSCOPY     EYE SURGERY     OOPHORECTOMY     tonsilletomy  1953    Family History  Problem Relation Age of Onset   Atrial fibrillation Mother        had cv in her 941 s  Arthritis Mother        had staph infection knee replacment   Cancer Sister        thryoid   Fibromyalgia Brother    Colon cancer Neg Hx    Rectal cancer Neg Hx    Stomach cancer Neg Hx     Social History   Socioeconomic History   Marital status: Unknown    Spouse name: Not on file   Number of children: Not on file   Years of education: Not on file   Highest education level: Not on file  Occupational History   Occupation: Retired  Tobacco Use   Smoking status: Never   Smokeless tobacco: Never  Vaping Use   Vaping Use: Never used  Substance and Sexual Activity   Alcohol use: No  Drug use: No   Sexual activity: Not on file  Other Topics Concern   Not on file  Social History Narrative   hh of 1   No pets   No ets.   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      Mom age 19 friends home snif recently af TKR infection passed away 04/30/15    Social Determinants of Health   Financial Resource Strain: Low Risk  (11/25/2021)   Overall Financial Resource Strain (CARDIA)    Difficulty of Paying Living Expenses: Not hard at all  Food Insecurity: No Food Insecurity (11/25/2021)   Hunger Vital Sign    Worried About Running Out of Food in the Last Year: Never true    Ran Out of Food in the Last Year: Never true  Transportation Needs: No Transportation Needs (11/25/2021)   PRAPARE - Hydrologist (Medical): No    Lack of Transportation (Non-Medical): No  Physical Activity: Insufficiently Active (11/25/2021)   Exercise Vital Sign    Days  of Exercise per Week: 4 days    Minutes of Exercise per Session: 30 min  Stress: No Stress Concern Present (11/25/2021)   [place]    Feeling of Stress : Not at all  Social Connections: Socially Isolated (11/19/2020)   Social Connection and Isolation Panel [NHANES]    Frequency of Communication with Friends and Family: Twice a week    Frequency of Social Gatherings with Friends and Family: Twice a week    Attends Religious Services: Never    Marine scientist or Organizations: No    Attends Archivist Meetings: Never    Marital Status: Widowed    Outpatient Medications Prior to Visit  Medication Sig Dispense Refill   acetaminophen (TYLENOL) 500 MG tablet Take 1,000 mg by mouth every 6 (six) hours as needed.     beta carotene w/minerals (OCUVITE) tablet Take 1 tablet by mouth daily.     Calcium Carbonate-Vitamin D 600-400 MG-UNIT tablet Take 1 tablet by mouth daily.     co-enzyme Q-10 50 MG capsule Take 150 mg by mouth daily.     EPINEPHrine 0.3 mg/0.3 mL IJ SOAJ injection USE AS DIRECTED AS NEEDED     fluconazole (DIFLUCAN) 150 MG tablet Take 1 tablet (150 mg total) by mouth daily. 1 tablet 0   fluticasone (FLONASE) 50 MCG/ACT nasal spray Place into both nostrils daily.     guaiFENesin (MUCINEX) 600 MG 12 hr tablet Take by mouth 2 (two) times daily.     ibuprofen (ADVIL,MOTRIN) 200 MG tablet Take 200 mg by mouth every 6 (six) hours as needed.     loratadine (CLARITIN) 10 MG tablet Take 10 mg by mouth as needed.     TART CHERRY PO Take by mouth.     Turmeric 500 MG TABS Take by mouth.     atorvastatin (LIPITOR) 10 MG tablet TAKE 1 TABLET BY MOUTH EVERY DAY 90 tablet 0   levothyroxine (SYNTHROID) 25 MCG tablet TAKE 1 TABLET BY MOUTH EVERY DAY BEFORE breakfast 90 tablet 0   telmisartan-hydrochlorothiazide (MICARDIS HCT) 40-12.5 MG tablet TAKE 1 TABLET BY MOUTH EVERY DAY 90 tablet 0   ondansetron (ZOFRAN-ODT)  4 MG disintegrating tablet Take 1 tablet (4 mg total) by mouth every 8 (eight) hours as needed for nausea or vomiting (or  migraine). (Patient not taking: Reported on 04/03/2022) 15 tablet 1   benzonatate (TESSALON PERLES) 100 MG capsule 1-2 capsules up to twice daily as needed for cough 30 capsule 0   doxycycline (VIBRA-TABS) 100 MG tablet Take 1 tablet (100 mg total) by mouth 2 (two) times daily. 20 tablet 0   promethazine-dextromethorphan (PROMETHAZINE-DM) 6.25-15 MG/5ML syrup Take 5 mLs by mouth 4 (four) times daily as needed for cough. 118 mL 0   No facility-administered medications prior to visit.     EXAM:  BP (!) 154/92   Pulse 77   Temp 98.2 F (36.8 C) (Oral)   Ht 5' 3.5" (1.613 m)   Wt 159 lb (72.1 kg)   SpO2 97%   BMI 27.72 kg/m   Body mass index is 27.72 kg/m. Wt Readings from Last 3 Encounters:  04/03/22 159 lb (72.1 kg)  01/15/22 160 lb (72.6 kg)  11/25/21 152 lb (68.9 kg)   BP Readings from Last 3 Encounters:  04/03/22 (!) 154/92  01/15/22 120/82  10/08/21 126/84    Physical Exam: Vital signs reviewed [id number] is a well-developed well-nourished alert cooperative    who appearsr stated age in no acute distress.  HEENT: normocephalic atraumatic , Eyes: PERRL EOM's full, conjunctiva clear, Nares: paten,t no deformity discharge or tenderness., Ears: no deformity EAC's clear TMs with normal landmarks. Mouth: clear OP, no lesions, edema.  Moist mucous membranes. Dentition in adequate repair. NECK: supple without masses, thyromegaly or bruits. CHEST/PULM:  Clear to auscultation and percussion breath sounds equal no wheeze , rales or rhonchi. No chest wall deformities or tenderness. Breast: normal by inspection . No dimpling, discharge, masses, tenderness or discharge . CV: PMI is nondisplaced, S1 S2 no gallops, murmurs, rubs. Peripheral pulses are full without delay.No JVD .  ABDOMEN: Bowel sounds normal nontender  No guard or rebound, no hepato splenomegal no CVA  tenderness.  Extremtities:  No clubbing cyanosis or edema, no acute joint swelling or redness no focal atrophy NEURO:  Oriented x3, cranial nerves 3-12 appear to be intact, no obvious focal weakness,gait within normal limits no abnormal reflexes or asymmetrical SKIN: No acute rashes normal turgor, color, no bruising or petechiae. PSYCH: Oriented, good eye contact, no obvious depression anxiety, cognition and judgment appear normal. LN: no cervical axillary inguinal adenopathy sprapubic area old scar puffy but no discrete mass  Lab Results  Component Value Date   WBC 4.9 04/03/2022   HGB 14.9 04/03/2022   HCT 45.2 04/03/2022   PLT 171.0 04/03/2022   GLUCOSE 99 04/03/2022   CHOL 155 04/03/2022   TRIG 89.0 04/03/2022   HDL 45.20 04/03/2022   LDLDIRECT 171.0 06/17/2006   LDLCALC 92 04/03/2022   ALT 16 04/03/2022   AST 13 04/03/2022   NA 138 04/03/2022   K 4.5 04/03/2022   CL 99 04/03/2022   CREATININE 0.87 04/03/2022   BUN 21 04/03/2022   CO2 30 04/03/2022   TSH 1.83 04/03/2022   HGBA1C 5.6 04/03/2022    BP Readings from Last 3 Encounters:  04/03/22 (!) 154/92  01/15/22 120/82  10/08/21 126/84    Lab plan  reviewed with patient   ASSESSMENT AND PLAN:  Discussed the following assessment and plan:    ICD-10-CM   1. Visit for preventive health examination  Z00.00     2. Essential hypertension  I10 CBC with Differential/Platelet    Comprehensive metabolic panel    Lipid panel    TSH    Hemoglobin A1c    3.  Medication management  Z79.899 CBC with Differential/Platelet    Comprehensive metabolic panel    Lipid panel    TSH    Hemoglobin A1c    4. Hypothyroidism, unspecified type  E03.9 CBC with Differential/Platelet    Comprehensive metabolic panel    Lipid panel    TSH    Hemoglobin A1c    5. Hyperlipidemia, unspecified hyperlipidemia type  E78.5 CBC with Differential/Platelet    Comprehensive metabolic panel    Lipid panel    TSH    Hemoglobin A1c    6.  Elevated blood sugar  R73.9 CBC with Differential/Platelet    Comprehensive metabolic panel    Lipid panel    TSH    Hemoglobin A1c    7. Estrogen deficiency  E28.39 DG Bone Density    Lab and update  dexa s possible . Continue same meds at this time. Monitor  Attention to diet sodium foods  etc  modest weight loss may help BP  control Return in about 1 year (around 04/04/2023) for depending on results.  Patient Care Team: Blakley Michna, Standley Brooking, MD as PCP - General Ernesto Rutherford Maudry Mayhew, MD (Otolaryngology) Allyn Kenner, MD (Dermatology) Latanya Maudlin, MD (Orthopedic Surgery) ed Eddie Dibbles (Ophthalmology) Tiajuana Amass, MD as Consulting Physician (Allergy and Immunology) Patient Instructions  Good to see  you today .  Check BP readings at home.  Take blood pressure readings twice a day for 5-7 days and then periodically .To ensure below 140/90   .Send in readings     Work on healthy weight control.   Please call [phone] to schedule a Bone Density (DexaScan) a the [place] office at [place].   Lab today        Standley Brooking. Arlet Marter M.D.

## 2022-04-03 NOTE — Patient Instructions (Addendum)
Good to see  you today .  Check BP readings at home.  Take blood pressure readings twice a day for 5-7 days and then periodically .To ensure below 140/90   .Send in readings     Work on healthy weight control.   Please call [phone] to schedule a Bone Density (DexaScan) a the [place] office at [place].   Lab today

## 2022-04-05 ENCOUNTER — Encounter: Payer: Self-pay | Admitting: Internal Medicine

## 2022-04-07 NOTE — Progress Notes (Signed)
Results in range or stable    no diabetes .  Thyroid in range  No changes  Continue attention to lifestyle intervention healthy eating and exercise .

## 2022-04-15 ENCOUNTER — Encounter: Payer: Self-pay | Admitting: Internal Medicine

## 2022-04-17 ENCOUNTER — Ambulatory Visit: Payer: Medicare Other | Admitting: Family Medicine

## 2022-05-04 ENCOUNTER — Encounter: Payer: Self-pay | Admitting: Internal Medicine

## 2022-05-29 ENCOUNTER — Ambulatory Visit: Payer: Medicare Other | Admitting: Family Medicine

## 2022-06-19 ENCOUNTER — Other Ambulatory Visit: Payer: Self-pay | Admitting: Family Medicine

## 2022-06-25 NOTE — Progress Notes (Unsigned)
Cheryl Robbins [address] [place] [address] Phone: [phone] Subjective:   INadine Counts, am serving as a scribe for Dr. Antoine Robbins.  I'm seeing this patient by the request  of:  Cheryl Robbins, Cheryl Mends, MD  CC: Low back pain, left knee pain  [id number]  01/15/2022 Low back does have some low back pain. Still doing well, discussed HEP doing well at this time, no other changes, f/u prn        Update 06/26/2022 Cheryl Robbins is a 77 y.o. female coming in with complaint of lumbar spine pain. Patient states back pain decreased in intensity and frequency. Doing well. L knee pain that's sporadic and achy. Knee will hurt after activity.     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   CATARACT EXTRACTION     Both eyes   COLONOSCOPY     EYE SURGERY     OOPHORECTOMY     tonsilletomy  1953   Social History   Socioeconomic History   Marital status: Unknown    Spouse name: Not on file   Number of children: Not on file   Years of education: Not on file   Highest education level: Not on file  Occupational History   Occupation: Retired  Tobacco Use   Smoking status: Never   Smokeless tobacco: Never  Vaping Use   Vaping Use: Never used  Substance and Sexual Activity   Alcohol use: No   Drug use: No   Sexual activity: Not on file  Other Topics Concern   Not on file  Social History Narrative   hh of 1   No pets   No ets.   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       Mom age 48 friends home snif recently af TKR infection passed away 2015-07-17    Social Determinants of Health   Financial Resource Strain: Low Risk  (11/25/2021)   Overall Financial Resource Strain (CARDIA)    Difficulty of Paying Living Expenses: Not hard at all  Food Insecurity: No Food Insecurity (11/25/2021)   Hunger Vital Sign    Worried About Running Out of Food in the Last Year: Never true    Ran Out of Food in the Last Year: Never true  Transportation Needs: No Transportation Needs (11/25/2021)   PRAPARE - Administrator, Civil Service (Medical): No    Lack of Transportation (Non-Medical): No  Physical Activity: Insufficiently Active (11/25/2021)   Exercise Vital Sign    Days of Exercise per Week: 4 days    Minutes of Exercise per Session: 30 min  Stress: No Stress Concern Present (11/25/2021)   [employer] of Occupational Health - Occupational Stress Questionnaire    Feeling of Stress : Not at all  Social Connections: Socially Isolated (11/19/2020)   Social Connection and Isolation Panel [NHANES]  Frequency of Communication with Friends and Family: Twice a week    Frequency of Social Gatherings with Friends and Family: Twice a week    Attends Religious Services: Never    Database administrator or Organizations: No    Attends Banker Meetings: Never    Marital Status: Widowed   Allergies  Allergen Reactions   Penicillins     Gums swelled up and turned blue   Ace Inhibitors Cough    Went away when med changed    Clindamycin/Lincomycin     Had rash with diflucan and clind but cant tell which one it was  As per dr Haroldine Laws   Sulfonamide Derivatives    Simvastatin Other (See Comments)    Body leg aches  On simva and doxy better off see July 17 note   Family History  Problem Relation Age of Onset   Atrial fibrillation Mother        had cv in her 86 s   Arthritis Mother        had staph infection knee replacment   Cancer Sister         thryoid   Fibromyalgia Brother    Colon cancer Neg Hx    Rectal cancer Neg Hx    Stomach cancer Neg Hx     Current Outpatient Medications (Endocrine & Metabolic):    levothyroxine (SYNTHROID) 25 MCG tablet, TAKE 1 TABLET BY MOUTH EVERY DAY BEFORE breakfast  Current Outpatient Medications (Cardiovascular):    atorvastatin (LIPITOR) 10 MG tablet, Take 1 tablet (10 mg total) by mouth daily.   EPINEPHrine 0.3 mg/0.3 mL IJ SOAJ injection, USE AS DIRECTED AS NEEDED   telmisartan-hydrochlorothiazide (MICARDIS HCT) 40-12.5 MG tablet, Take 1 tablet by mouth daily.  Current Outpatient Medications (Respiratory):    fluticasone (FLONASE) 50 MCG/ACT nasal spray, Place into both nostrils daily.   guaiFENesin (MUCINEX) 600 MG 12 hr tablet, Take by mouth 2 (two) times daily.   loratadine (CLARITIN) 10 MG tablet, Take 10 mg by mouth as needed.  Current Outpatient Medications (Analgesics):    acetaminophen (TYLENOL) 500 MG tablet, Take 1,000 mg by mouth every 6 (six) hours as needed.   ibuprofen (ADVIL,MOTRIN) 200 MG tablet, Take 200 mg by mouth every 6 (six) hours as needed.   Current Outpatient Medications (Other):    beta carotene w/minerals (OCUVITE) tablet, Take 1 tablet by mouth daily.   Calcium Carbonate-Vitamin D 600-400 MG-UNIT tablet, Take 1 tablet by mouth daily.   co-enzyme Q-10 50 MG capsule, Take 150 mg by mouth daily.   fluconazole (DIFLUCAN) 150 MG tablet, Take 1 tablet (150 mg total) by mouth daily.   gabapentin (NEURONTIN) 100 MG capsule, TAKE 2 CAPSULES BY MOUTH AT BEDTIME   ondansetron (ZOFRAN-ODT) 4 MG disintegrating tablet, Take 1 tablet (4 mg total) by mouth every 8 (eight) hours as needed for nausea or vomiting (or migraine). (Patient not taking: Reported on 04/03/2022)   TART CHERRY PO, Take by mouth.   Turmeric 500 MG TABS, Take by mouth.   Reviewed prior external information including notes and imaging from  primary care provider As well as notes that were available from  care everywhere and other healthcare systems.  Past medical history, social, surgical and family history all reviewed in electronic medical record.  No pertanent information unless stated regarding to the chief complaint.   Review of Systems:  No headache, visual changes, nausea, vomiting, diarrhea, constipation, dizziness, abdominal pain, skin rash, fevers, chills, night sweats, weight  loss, swollen lymph nodes, body aches, joint swelling, chest pain, shortness of breath, mood changes. POSITIVE muscle aches  Objective  Blood pressure (!) 130/90, pulse 72, height 5\' 3"  (1.6 m), weight 160 lb (72.6 kg), SpO2 97 %.   General: No apparent distress alert and oriented x3 mood and affect normal, dressed appropriately.  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  Does have lateral tracking of the patella noted with a patellar grind test noted.  No significant instability of the knee  [id number]; 15 additional minutes spent for Therapeutic exercises as stated in above notes.  This included exercises focusing on stretching, strengthening, with significant focus on eccentric aspects.   Long term goals include an improvement in range of motion, strength, endurance as well as avoiding reinjury. Patient's frequency would include in 1-2 times a day, 3-5 times a week for a duration of 6-12 weeks.  Reviewed anatomy using anatomical model and how PFS occurs.  Given rehab exercises handout for VMO, hip abductors, core, entire kinetic chain including proprioception exercises.  Could benefit from PT, regular exercise, upright biking, and a PFS knee brace to assist with tracking abnormalities. Proper  technique shown and discussed handout in great detail with ATC.  All questions were discussed and answered.      Impression and Recommendations:    The above documentation has been reviewed and is accurate and complete Judi Saa, DO

## 2022-06-26 ENCOUNTER — Ambulatory Visit (INDEPENDENT_AMBULATORY_CARE_PROVIDER_SITE_OTHER): Payer: Medicare Other

## 2022-06-26 ENCOUNTER — Ambulatory Visit: Payer: Medicare Other | Admitting: Family Medicine

## 2022-06-26 ENCOUNTER — Encounter: Payer: Self-pay | Admitting: Family Medicine

## 2022-06-26 VITALS — BP 130/90 | HR 72 | Ht 63.0 in | Wt 160.0 lb

## 2022-06-26 DIAGNOSIS — M25562 Pain in left knee: Secondary | ICD-10-CM

## 2022-06-26 DIAGNOSIS — M222X2 Patellofemoral disorders, left knee: Secondary | ICD-10-CM

## 2022-06-26 DIAGNOSIS — M545 Low back pain, unspecified: Secondary | ICD-10-CM | POA: Diagnosis not present

## 2022-06-26 NOTE — Assessment & Plan Note (Signed)
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  Discussed with patient about the trochanteric bursitis as well to monitor.  Continue to work on core strengthening and hip abductor strengthening.  No change in medications.  Follow-up in 2 to 3 months

## 2022-06-26 NOTE — Assessment & Plan Note (Signed)
Patient does have bladder tracking and likely does have some underlying arthritis.  Tru pull lite brace given, VMO strengthening given, discussed icing regimen and home exercises.  Worsening pain consider the possibility of injections.  We discussed the potential need for physical therapy if this continues as well.  Follow-up again in 2 to 3 months

## 2022-06-26 NOTE — Patient Instructions (Signed)
Good to see you! You have 14 days to return or exchange your brace Call [phone], then return the brace to our office Do prescribed exercises at least 3x a week Xray Voltaren or Arnica gel See you again in 2-3 motnhs

## 2022-08-08 ENCOUNTER — Ambulatory Visit (INDEPENDENT_AMBULATORY_CARE_PROVIDER_SITE_OTHER)
Admission: RE | Admit: 2022-08-08 | Discharge: 2022-08-08 | Disposition: A | Discharge status: Home / Self Care | Payer: Medicare Other | Source: Ambulatory Visit | Attending: Internal Medicine | Admitting: Internal Medicine

## 2022-08-08 DIAGNOSIS — E2839 Other primary ovarian failure: Secondary | ICD-10-CM | POA: Diagnosis not present

## 2022-08-11 NOTE — Progress Notes (Signed)
Low bone density  .  Some increase 10 year risk of hip .  Advise  weight bearing exercise make sure reg vit d intake  and repeat   dexa in 2 years.

## 2022-09-10 ENCOUNTER — Telehealth: Payer: Self-pay

## 2022-09-10 ENCOUNTER — Telehealth (INDEPENDENT_AMBULATORY_CARE_PROVIDER_SITE_OTHER): Payer: Medicare Other | Admitting: Family Medicine

## 2022-09-10 ENCOUNTER — Encounter: Payer: Self-pay | Admitting: Family Medicine

## 2022-09-10 VITALS — Ht 63.0 in | Wt 160.0 lb

## 2022-09-10 DIAGNOSIS — U071 COVID-19: Secondary | ICD-10-CM | POA: Diagnosis not present

## 2022-09-10 MED ORDER — NIRMATRELVIR/RITONAVIR (PAXLOVID)TABLET: 3.0000 | ORAL_TABLET | Freq: Two times a day (BID) | ORAL | 0 refills | Status: AC

## 2022-09-10 NOTE — Telephone Encounter (Signed)
"  Patient was unable to self-report due to a lack of equipment at home via telehealth" for visit today at 5:00 with Dr. Caryl Never

## 2022-09-10 NOTE — Progress Notes (Signed)
Patient ID: Cheryl Robbins, female   DOB: August 31, 1945, 77 y.o.   MRN: [id number]   Virtual Visit via Video Note  I connected with [facility] on 09/10/22 at  5:00 PM EDT by a video enabled telemedicine application and verified that I am speaking with the correct person using two identifiers.  Location patient: home Location provider:work or home office Persons participating in the virtual visit: patient, provider  I discussed the limitations of evaluation and management by telemedicine and the availability of in person appointments. The patient expressed understanding and agreed to proceed.   HPI: Cheryl Robbins is seen with COVID-19 infection.  [place] the beach with [place] and last night developed some headache, low-grade fever, nasal congestion.  Minimal cough.  No vomiting.  No diarrhea.  She states she has not had COVID until now.  This is her first Covid infection.  None of her other family members have had any symptoms.  She is trying to stay in isolation.  She does take Lipitor and telmisartan.  Also takes low-dose levothyroxine.  Non-smoker.  No chronic heart or lung problems.   ROS: See pertinent positives and negatives per HPI.  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   CATARACT EXTRACTION     Both eyes   COLONOSCOPY     EYE SURGERY     OOPHORECTOMY     tonsilletomy  1953    Family History  Problem Relation Age of Onset   Atrial fibrillation Mother        had cv in her 7 s   Arthritis Mother        had staph infection knee replacment    Cancer Sister        thryoid   Fibromyalgia Brother    Colon cancer Neg Hx    Rectal cancer Neg Hx    Stomach cancer Neg Hx     SOCIAL HX: Non-smoker   Current Outpatient Medications:    acetaminophen (TYLENOL) 500 MG tablet, Take 1,000 mg by mouth every 6 (six) hours as needed., Disp: , Rfl:    atorvastatin (LIPITOR) 10 MG tablet, Take 1 tablet (10 mg total) by mouth daily., Disp: 90 tablet, Rfl: 3   beta carotene w/minerals (OCUVITE) tablet, Take 1 tablet by mouth daily., Disp: , Rfl:    Calcium Carbonate-Vitamin D 600-400 MG-UNIT tablet, Take 1 tablet by mouth daily., Disp: , Rfl:    co-enzyme Q-10 50 MG capsule, Take 150 mg by mouth daily., Disp: , Rfl:    EPINEPHrine 0.3 mg/0.3 mL IJ SOAJ injection, USE AS DIRECTED AS NEEDED, Disp: , Rfl:    fluticasone (FLONASE) 50 MCG/ACT nasal spray, Place into both nostrils daily., Disp: , Rfl:    gabapentin (NEURONTIN) 100 MG capsule, TAKE 2 CAPSULES BY MOUTH AT BEDTIME, Disp: 60 capsule, Rfl: 2   guaiFENesin (MUCINEX) 600 MG 12 hr tablet, Take by mouth 2 (two) times daily., Disp: , Rfl:  ibuprofen (ADVIL,MOTRIN) 200 MG tablet, Take 200 mg by mouth every 6 (six) hours as needed., Disp: , Rfl:    levothyroxine (SYNTHROID) 25 MCG tablet, TAKE 1 TABLET BY MOUTH EVERY DAY BEFORE breakfast, Disp: 90 tablet, Rfl: 3   loratadine (CLARITIN) 10 MG tablet, Take 10 mg by mouth as needed., Disp: , Rfl:    nirmatrelvir/ritonavir (PAXLOVID) 20 x 150 MG & 10 x 100MG  TABS, Take 3 tablets by mouth 2 (two) times daily for 5 days. (Take nirmatrelvir 150 mg two tablets twice daily for 5 days and ritonavir 100 mg one tablet twice daily for 5 days) Patient GFR is 64, Disp: 30 tablet, Rfl: 0   ondansetron (ZOFRAN-ODT) 4 MG disintegrating tablet, Take 1 tablet (4 mg total) by mouth every 8 (eight) hours as needed for nausea or vomiting (or migraine)., Disp: 15 tablet, Rfl: 1   TART CHERRY PO, Take by mouth., Disp: , Rfl:    telmisartan-hydrochlorothiazide (MICARDIS  HCT) 40-12.5 MG tablet, Take 1 tablet by mouth daily., Disp: 90 tablet, Rfl: 3   Turmeric 500 MG TABS, Take by mouth., Disp: , Rfl:   EXAM:  VITALS per patient if applicable:  GENERAL: alert, oriented, appears well and in no acute distress  HEENT: atraumatic, conjunttiva clear, no obvious abnormalities on inspection of external nose and ears  NECK: normal movements of the head and neck  LUNGS: on inspection no signs of respiratory distress, breathing rate appears normal, no obvious gross SOB, gasping or wheezing  CV: no obvious cyanosis  MS: moves all visible extremities without noticeable abnormality  PSYCH/NEURO: pleasant and cooperative, no obvious depression or anxiety, speech and thought processing grossly intact  ASSESSMENT AND PLAN:  Discussed the following assessment and plan:  COVID-19 infection.  Patient nontoxic in appearance.  Keeping down fluids well.  No prior COVID infection.  No chronic heart or lung problems.  We discussed antivirals and she would like to try that.  -We discussed starting Paxlovid twice daily.  Her last GFR was 64.  We did advise her to hold atorvastatin while taking the Paxlovid -Tylenol or Advil as needed for body aches and fever -Follow-up immediately for any increased shortness of breath or other concerns -Discussed isolation recommendations     I discussed the assessment and treatment plan with the patient. The patient was provided an opportunity to ask questions and all were answered. The patient agreed with the plan and demonstrated an understanding of the instructions.   The patient was advised to call back or seek an in-person evaluation if the symptoms worsen or if the condition fails to improve as anticipated.     Cheryl Peat, MD

## 2022-09-17 ENCOUNTER — Other Ambulatory Visit: Payer: Self-pay | Admitting: Family Medicine

## 2022-09-26 ENCOUNTER — Ambulatory Visit: Payer: Medicare Other | Admitting: Family Medicine

## 2022-10-07 NOTE — Progress Notes (Unsigned)
[name] [address] [place] [address] Phone: [phone] Subjective:   Bruce Donath, am serving as a scribe for Dr. Antoine Primas.  I'm seeing this patient by the request  of:  Panosh, Neta Mends, MD  CC: Much back pain follow-up  [id number]  06/26/2022 Muscle imbalance secondary to some polio.  Discussed with patient about the trochanteric bursitis as well to monitor.  Continue to work on core strengthening and hip abductor strengthening.  No change in medications.  Follow-up in 2 to 3 months     Patient does have bladder tracking and likely does have some underlying arthritis.  Tru pull lite brace given, VMO strengthening given, discussed icing regimen and home exercises.  Worsening pain consider the possibility of injections.  We discussed the potential need for physical therapy if this continues as well.  Follow-up again in 2 to 3 months     Updated 10/08/2022 Cheryl Robbins is a 77 y.o. female coming in with complaint of knee and back pain. Pain in back is unchanged but is manageable. Pain increases with pick up grandkids and weather changes.   Knee pain is improving. Unable to walk a long distance without pain and get stiff if she sits for prolonged periods.  Patient would state that overall she has been doing very well.  Takes Advil very intermittently.  Nothing that is stopping her from activity.     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   Primiparous    Rosacea    Past Surgical History:  Procedure Laterality Date   atypical moles     bilateral salpingoopherectomy     for r 4cm clear r adnexal mass 13-Oct-2002 serous cystadenoma   CATARACT EXTRACTION     Both eyes   COLONOSCOPY     EYE SURGERY      OOPHORECTOMY     tonsilletomy  1953   Social History   Socioeconomic History   Marital status: Unknown    Spouse name: Not on file   Number of children: Not on file   Years of education: Not on file   Highest education level: Not on file  Occupational History   Occupation: Retired  Tobacco Use   Smoking status: Never   Smokeless tobacco: Never  Vaping Use   Vaping status: Never Used  Substance and Sexual Activity   Alcohol use: No   Drug use: No   Sexual activity: Not on file  Other Topics Concern   Not on file  Social History Narrative   hh of 1   No pets   No ets.   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      Mom age 35 friends home snif recently af TKR infection passed away October 13, 2015    Social Determinants of Health   Financial Resource Strain: Low Risk  (11/25/2021)   Overall Financial Resource Strain (CARDIA)    Difficulty of Paying Living Expenses: Not hard at all  Food Insecurity: No Food Insecurity (11/25/2021)   Hunger Vital Sign    Worried About Running Out of Food in the Last  Year: Never true    Ran Out of Food in the Last Year: Never true  Transportation Needs: No Transportation Needs (11/25/2021)   PRAPARE - Administrator, Civil Service (Medical): No    Lack of Transportation (Non-Medical): No  Physical Activity: Insufficiently Active (11/25/2021)   Exercise Vital Sign    Days of Exercise per Week: 4 days    Minutes of Exercise per Session: 30 min  Stress: No Stress Concern Present (11/25/2021)   [employer] of Occupational Health - Occupational Stress Questionnaire    Feeling of Stress : Not at all  Social Connections: Socially Isolated (11/19/2020)   Social Connection and Isolation Panel [NHANES]    Frequency of Communication with Friends and Family: Twice a week    Frequency of Social Gatherings with Friends and Family: Twice a week    Attends Religious Services: Never     Database administrator or Organizations: No    Attends Banker Meetings: Never    Marital Status: Widowed   Allergies  Allergen Reactions   Penicillins     Gums swelled up and turned blue   Ace Inhibitors Cough    Went away when med changed    Clindamycin/Lincomycin     Had rash with diflucan and clind but cant tell which one it was  As per dr Haroldine Laws   Sulfonamide Derivatives    Simvastatin Other (See Comments)    Body leg aches  On simva and doxy better off see July 17 note   Family History  Problem Relation Age of Onset   Atrial fibrillation Mother        had cv in her 23 s   Arthritis Mother        had staph infection knee replacment   Cancer Sister        thryoid   Fibromyalgia Brother    Colon cancer Neg Hx    Rectal cancer Neg Hx    Stomach cancer Neg Hx     Current Outpatient Medications (Endocrine & Metabolic):    levothyroxine (SYNTHROID) 25 MCG tablet, TAKE 1 TABLET BY MOUTH EVERY DAY BEFORE breakfast  Current Outpatient Medications (Cardiovascular):    atorvastatin (LIPITOR) 10 MG tablet, Take 1 tablet (10 mg total) by mouth daily.   EPINEPHrine 0.3 mg/0.3 mL IJ SOAJ injection, USE AS DIRECTED AS NEEDED   telmisartan (MICARDIS) 40 MG tablet, Take 40 mg by mouth daily.   telmisartan-hydrochlorothiazide (MICARDIS HCT) 40-12.5 MG tablet, Take 1 tablet by mouth daily.  Current Outpatient Medications (Respiratory):    fluticasone (FLONASE) 50 MCG/ACT nasal spray, Place into both nostrils daily.   guaiFENesin (MUCINEX) 600 MG 12 hr tablet, Take by mouth 2 (two) times daily.   loratadine (CLARITIN) 10 MG tablet, Take 10 mg by mouth as needed.  Current Outpatient Medications (Analgesics):    acetaminophen (TYLENOL) 500 MG tablet, Take 1,000 mg by mouth every 6 (six) hours as needed.   ibuprofen (ADVIL,MOTRIN) 200 MG tablet, Take 200 mg by mouth every 6 (six) hours as needed.   Current Outpatient Medications (Other):    Ascorbic Acid (VITAMIN C) 1000  MG tablet, Take 1,000 mg by mouth daily.   beta carotene w/minerals (OCUVITE) tablet, Take 1 tablet by mouth daily.   Calcium Carbonate-Vitamin D 600-400 MG-UNIT tablet, Take 1 tablet by mouth daily.   co-enzyme Q-10 50 MG capsule, Take 150 mg by mouth daily.   gabapentin (NEURONTIN) 100 MG capsule, TAKE 2 CAPSULES BY MOUTH  AT BEDTIME   TART CHERRY PO, Take by mouth.   Turmeric 500 MG TABS, Take by mouth.   TURMERIC PO, Take by mouth.   VITAMIN D, CHOLECALCIFEROL, PO, Take by mouth.   Review of Systems:  No headache, visual changes, nausea, vomiting, diarrhea, constipation, dizziness, abdominal pain, skin rash, fevers, chills, night sweats, weight loss, swollen lymph nodes, body aches, joint swelling, chest pain, shortness of breath, mood changes. POSITIVE muscle aches  Objective  Blood pressure 128/78, pulse 82, height 5\' 3"  (1.6 m), weight 160 lb (72.6 kg), SpO2 97%.   General: No apparent distress alert and oriented x3 mood and affect normal, dressed appropriately.  HEENT: Pupils equal, extraocular movements intact  Respiratory: Patient's speak in full sentences and does not appear short of breath  Cardiovascular: No lower extremity edema, non tender, no erythema  Patient does have some loss of lordosis.  Does have tightness noted.    Impression and Recommendations:    The above documentation has been reviewed and is accurate and complete Judi Saa, DO

## 2022-10-08 ENCOUNTER — Ambulatory Visit: Payer: Medicare Other | Admitting: Family Medicine

## 2022-10-08 ENCOUNTER — Encounter: Payer: Self-pay | Admitting: Family Medicine

## 2022-10-08 VITALS — BP 128/78 | HR 82 | Ht 63.0 in | Wt 160.0 lb

## 2022-10-08 DIAGNOSIS — M545 Low back pain, unspecified: Secondary | ICD-10-CM

## 2022-10-08 NOTE — Patient Instructions (Signed)
Good to see you Calcium 600mg  Vit D 2000IU to 4000IU daily See me in 3-4 months

## 2022-10-08 NOTE — Assessment & Plan Note (Signed)
Low back does have some loss of lordosis noted.  Patient though has been doing very well.  No pain over the sides of the hip at the moment.  Discussed icing regimen and home exercises.  Continue to stay active as well.

## 2022-11-11 ENCOUNTER — Ambulatory Visit (INDEPENDENT_AMBULATORY_CARE_PROVIDER_SITE_OTHER): Payer: Medicare Other | Admitting: Family Medicine

## 2022-11-11 VITALS — BP 122/82 | HR 94 | Ht 65.0 in | Wt 150.0 lb

## 2022-11-11 DIAGNOSIS — Z0000 Encounter for general adult medical examination without abnormal findings: Secondary | ICD-10-CM | POA: Diagnosis not present

## 2022-11-11 NOTE — Patient Instructions (Addendum)
I really enjoyed getting to talk with you today! I am available on Tuesdays and Thursdays for virtual visits if you have any questions or concerns, or if I can be of any further assistance.   CHECKLIST FROM ANNUAL WELLNESS VISIT:  -Follow up (please call to schedule if not scheduled after visit):   -yearly for annual wellness visit with primary care office  Here is a list of your preventive care/health maintenance measures and the plan for each if any are due:  PLAN For any measures below that may be due:  -can get the flu shot at any point -can get the covid vaccine 3 months after the covid infection -let us know when you get the vaccines so that we can update your record  Health Maintenance  Topic Date Due   COVID-19 Vaccine (8 - 2023-24 season) 10/19/2022   INFLUENZA VACCINE  05/18/2023 (Originally 09/18/2022)   Medicare Annual Wellness (AWV)  11/11/2023   DTaP/Tdap/Td (3 - Td or Tdap) 08/21/2025   Pneumonia Vaccine 37+ Years old  Completed   DEXA SCAN  Completed   Hepatitis C Screening  Completed   Zoster Vaccines- Shingrix  Completed   HPV VACCINES  Aged Out   Colonoscopy  Discontinued    -See a dentist at least yearly  -Get your eyes checked and then per your eye specialist's recommendations  -Other issues addressed today:   -I have included below further information regarding a healthy whole foods based diet, physical activity guidelines for adults, stress management and opportunities for social connections. I hope you find this information useful.   -----------------------------------------------------------------------------------------------------------------------------------------------------------------------------------------------------------------------------------------------------------  NUTRITION: -eat real food: lots of colorful vegetables (half the plate) and fruits -5-7 servings of vegetables and fruits per day (fresh or steamed is best), exp. 2 servings  of vegetables with lunch and dinner and 2 servings of fruit per day. Berries and greens such as kale and collards are great choices.  -consume on a regular basis: whole grains (make sure first ingredient on label contains the word "whole"), fresh fruits, fish, nuts, seeds, healthy oils (such as olive oil, avocado oil, grape seed oil) -may eat small amounts of dairy and lean meat on occasion, but avoid processed meats such as ham, bacon, lunch meat, etc. -drink water -try to avoid fast food and pre-packaged foods, processed meat -most experts advise limiting sodium to < 2300mg  per day, should limit further is any chronic conditions such as high blood pressure, heart disease, diabetes, etc. The American Heart Association advised that < 1500mg  is is ideal -try to avoid foods that contain any ingredients with names you do not recognize  -try to avoid sugar/sweets (except for the natural sugar that occurs in fresh fruit) -try to avoid sweet drinks -try to avoid white rice, white bread, pasta (unless whole grain), white or yellow potatoes  EXERCISE GUIDELINES FOR ADULTS: -if you wish to increase your physical activity, do so gradually and with the approval of your doctor -STOP and seek medical care immediately if you have any chest pain, chest discomfort or trouble breathing when starting or increasing exercise  -move and stretch your body, legs, feet and arms when sitting for long periods -Physical activity guidelines for optimal health in adults: -least 150 minutes per week of aerobic exercise (can talk, but not sing) once approved by your doctor, 20-30 minutes of sustained activity or two 10 minute episodes of sustained activity every day.  -resistance training at least 2 days per week if approved by your doctor -balance  exercises 3+ days per week:   Stand somewhere where you have something sturdy to hold onto if you lose balance.    1) lift up on toes, start with 5x per day and work up to 20x   2)  stand and lift on leg straight out to the side so that foot is a few inches of the floor, start with 5x each side and work up to 20x each side   3) stand on one foot, start with 5 seconds each side and work up to 20 seconds on each side  If you need ideas or help with getting more active:  -Silver sneakers https://tools.silversneakers.com  -Walk with a Doc: http://www.duncan-[email or web address]  -try to include resistance (weight lifting/strength building) and balance exercises twice per week: or the following link for ideas: http://castillo-powell.com/  BuyDucts.dk  STRESS MANAGEMENT: -can try meditating, or just sitting quietly with deep breathing while intentionally relaxing all parts of your body for 5 minutes daily -if you need further help with stress, anxiety or depression please follow up with your primary doctor or contact the wonderful folks at [facility]: [phone]  SOCIAL CONNECTIONS: -options in [place] if you wish to engage in more social and exercise related activities:  -Silver sneakers https://tools.silversneakers.com  -Walk with a Doc: http://www.duncan-[email or web address]/  -Check out the [facility] Active Adults 50+ section on the [place] [employer] (hiking clubs, book clubs, cards and games, chess, exercise classes, aquatic classes and much more) - see the website for details: https://www.[place].gov/departments/parks-recreation/active-adults50  -YouTube has lots of exercise videos for different ages and abilities as well  -[facility] (a variety of indoor and outdoor inperson activities for adults). [phone]. [address].  -Virtual Online Classes (a variety of topics): see seniorplanet.org or call [phone]  -consider volunteering at a school, hospice center, church, senior center or elsewhere

## 2022-11-11 NOTE — Progress Notes (Signed)
PATIENT CHECK-IN and HEALTH RISK ASSESSMENT QUESTIONNAIRE:  -completed by phone/video for upcoming Medicare Preventive Visit  Pre-Visit Check-in: 1)Vitals (height, wt, BP, etc) - record in vitals section for visit on day of visit Request home vitals (wt, BP, etc.) and enter into vitals, THEN update Vital Signs SmartPhrase below at the top of the HPI. See below.  2)Review and Update Medications, Allergies PMH, Surgeries, Social history in Epic 3)Hospitalizations in the last year with date/reason? No 4)Review and Update Care Team (patient's specialists) in Epic 5) Complete PHQ9 in Epic  6) Complete Fall Screening in Epic 7)Review all Health Maintenance Due and order under PCP if not done.  8)Medicare Wellness Questionnaire: Answer theses question about your habits: Do you drink alcohol? no If yes, how many drinks do you have a day?na Have you ever smoked?No Quit date if applicable? NA How many packs a day do/did you smoke? NA Do you use smokeless tobacco? No Do you use an illicit drugs?No Do you exercises? Walk IF so, what type and how many days/minutes per week? 3-5 day for a mile and a half; takes care of grandchildren which keeps her busy and strong Are you sexually active? No Number of partners?na Typical breakfast: [place] biscuit, hot tea, orange juice  Typical lunch: salad Typical dinner salad Typical snacks: chips, apples  Beverages: hot tea, orange juice, tea, water, very little mini coke  Answer theses question about you: Can you perform most household chores?yes Do you find it hard to follow a conversation in a noisy room? Depends on how noisy it is Do you often ask people to speak up or repeat themselves? no Do you feel that you have a problem with memory? no Do you balance your checkbook and or bank acounts? yes Do you feel safe at home? yes Last dentist visit? 7 months Do you need assistance with any of the following: Please note if so : No  Driving?  Feeding  yourself?  Getting from bed to chair?  Getting to the toilet?  Bathing or showering?  Dressing yourself?  Managing money?  Climbing a flight of stairs  Preparing meals?  Do you have Advanced Directives in place (Living Will, Healthcare Power or Attorney)?  yes   Last eye Exam and location? [facility] 1/24   Do you currently use prescribed or non-prescribed narcotic or opioid pain medications? no  Do you have a history or close family history of breast, ovarian, tubal or peritoneal cancer or a family member with BRCA (breast cancer susceptibility 1 and 2) gene mutations? no  Request home vitals (wt, BP, etc.) and enter into vitals, THEN update Vital Signs SmartPhrase below at the top of the HPI. See below.   Nurse/Assistant Credentials/time stamp:  Fleet Contras vereen CMA ----------------------------------------------------------------------------------------------------------------------------------------------------------------------------------------------------------------------  Vital Signs: Vital signs are patient reported.   MEDICARE ANNUAL PREVENTIVE VISIT WITH PROVIDER: (Welcome to Medicare, initial annual wellness or annual wellness exam)  Virtual Visit via Phone Note  I connected with Teslyn Delahoz Wike on 11/11/22 by phone and verified that I am speaking with the correct person using two identifiers.  Location patient: home Location provider:work or home office Persons participating in the virtual visit: patient, provider  Concerns and/or follow up today: now doing fine.    See HM section in Epic for other details of completed HM.    ROS: negative for report of fevers, unintentional weight loss, vision changes, vision loss, hearing loss or change, chest pain, sob, hemoptysis, melena, hematochezia, hematuria, falls, bleeding or bruising,  thoughts of suicide or self harm, memory loss  Patient-completed extensive health risk assessment - reviewed and discussed  with the patient: See Health Risk Assessment completed with patient prior to the visit either above or in recent phone note. This was reviewed in detailed with the patient today and appropriate recommendations, orders and referrals were placed as needed per Summary below and patient instructions.   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   Primiparous    Rosacea     Past Surgical History:  Procedure Laterality Date   atypical moles     bilateral salpingoopherectomy     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   CATARACT EXTRACTION     Both eyes   COLONOSCOPY     EYE SURGERY     OOPHORECTOMY     tonsilletomy  1953    Social History   Socioeconomic History   Marital status: Unknown    Spouse name: Not on file   Number of children: Not on file   Years of education: Not on file   Highest education level: Not on file  Occupational History   Occupation: Retired  Tobacco Use   Smoking status: Never   Smokeless tobacco: Never  Vaping Use   Vaping status: Never Used  Substance and Sexual Activity   Alcohol use: No   Drug use: No   Sexual activity: Not on file  Other Topics Concern   Not on file  Social History Narrative   hh of 1   No pets   No ets.   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  activity      Mom age 32 friends home snif recently af TKR infection passed away 2015/12/04    Social Determinants of Health   Financial Resource Strain: Low Risk  (11/25/2021)   Overall Financial Resource Strain (CARDIA)    Difficulty of Paying Living Expenses: Not hard at all  Food Insecurity: No Food Insecurity (11/25/2021)   Hunger Vital Sign    Worried About Running Out of Food in the Last Year: Never true    Ran Out of Food in the Last Year: Never true  Transportation Needs: No Transportation Needs (11/25/2021)   PRAPARE - Administrator, Civil Service (Medical): No    Lack of Transportation (Non-Medical): No  Physical Activity: Insufficiently Active (11/25/2021)   Exercise Vital Sign    Days of Exercise per Week: 4 days    Minutes of Exercise per Session: 30 min  Stress: No Stress Concern Present (11/25/2021)   [employer] of Occupational Health - Occupational Stress Questionnaire    Feeling of Stress : Not at all  Social Connections: Socially Isolated (11/19/2020)   Social Connection and Isolation Panel [NHANES]  Frequency of Communication with Friends and Family: Twice a week    Frequency of Social Gatherings with Friends and Family: Twice a week    Attends Religious Services: Never    Database administrator or Organizations: No    Attends Banker Meetings: Never    Marital Status: Widowed  Intimate Partner Violence: Not At Risk (11/19/2020)   Humiliation, Afraid, Rape, and Kick questionnaire    Fear of Current or Ex-Partner: No    Emotionally Abused: No    Physically Abused: No    Sexually Abused: No    Family History  Problem Relation Age of Onset   Atrial fibrillation Mother        had cv in her 57 s   Arthritis Mother        had staph infection knee replacment   Cancer Sister        thryoid   Fibromyalgia Brother    Colon cancer Neg Hx    Rectal cancer Neg Hx    Stomach cancer Neg Hx     Current Outpatient Medications on File Prior  to Visit  Medication Sig Dispense Refill   acetaminophen (TYLENOL) 500 MG tablet Take 1,000 mg by mouth every 6 (six) hours as needed.     Ascorbic Acid (VITAMIN C) 1000 MG tablet Take 1,000 mg by mouth daily.     atorvastatin (LIPITOR) 10 MG tablet Take 1 tablet (10 mg total) by mouth daily. 90 tablet 3   beta carotene w/minerals (OCUVITE) tablet Take 1 tablet by mouth daily.     Calcium Carbonate-Vitamin D 600-400 MG-UNIT tablet Take 1 tablet by mouth daily.     co-enzyme Q-10 50 MG capsule Take 150 mg by mouth daily.     EPINEPHrine 0.3 mg/0.3 mL IJ SOAJ injection USE AS DIRECTED AS NEEDED     fluticasone (FLONASE) 50 MCG/ACT nasal spray Place into both nostrils daily.     gabapentin (NEURONTIN) 100 MG capsule TAKE 2 CAPSULES BY MOUTH AT BEDTIME 60 capsule 2   guaiFENesin (MUCINEX) 600 MG 12 hr tablet Take by mouth 2 (two) times daily.     ibuprofen (ADVIL,MOTRIN) 200 MG tablet Take 200 mg by mouth every 6 (six) hours as needed.     levothyroxine (SYNTHROID) 25 MCG tablet TAKE 1 TABLET BY MOUTH EVERY DAY BEFORE breakfast 90 tablet 3   loratadine (CLARITIN) 10 MG tablet Take 10 mg by mouth as needed.     TART CHERRY PO Take by mouth.     telmisartan-hydrochlorothiazide (MICARDIS HCT) 40-12.5 MG tablet Take 1 tablet by mouth daily. 90 tablet 3   Turmeric 500 MG TABS Take by mouth.     VITAMIN D, CHOLECALCIFEROL, PO Take by mouth.     telmisartan (MICARDIS) 40 MG tablet Take 40 mg by mouth daily. (Patient not taking: Reported on 11/11/2022)     No current facility-administered medications on file prior to visit.    Allergies  Allergen Reactions   Penicillins     Gums swelled up and turned blue   Ace Inhibitors Cough    Went away when med changed    Clindamycin/Lincomycin     Had rash with diflucan and clind but cant tell which one it was  As per dr Haroldine Laws   Sulfonamide Derivatives    Simvastatin Other (See Comments)    Body leg aches  On simva and doxy better off see July 17 note        Physical Exam Vitals  requested from patient and listed below if patient had equipment and was able to obtain at home for this virtual visit: Vitals:   11/11/22 1602  BP: 122/82  Pulse: 94   Estimated body mass index is 24.96 kg/m as calculated from the following:   Height as of this encounter: 5\' 5"  (1.651 m).   Weight as of this encounter: 150 lb (68 kg).  EKG (optional): deferred due to virtual visit  GENERAL: alert, oriented, no acute distress detected, full vision exam deferred due to pandemic and/or virtual encounter  HEENT: atraumatic, conjunttiva clear, no obvious abnormalities on inspection of external nose and ears  NECK: normal movements of the head and neck  LUNGS: on inspection no signs of respiratory distress, breathing rate appears normal, no obvious gross SOB, gasping or wheezing  CV: no obvious cyanosis  MS: moves all visible extremities without noticeable abnormality  PSYCH/NEURO: pleasant and cooperative, no obvious depression or anxiety, speech and thought processing grossly intact, Cognitive function grossly intact  Flowsheet Row Office Visit from 11/11/2022 in [facility] [place]  PHQ-9 Total Score 0           11/11/2022    4:04 PM 04/03/2022    9:43 AM 11/25/2021    3:33 PM 01/14/2021    9:04 AM 11/19/2020   11:35 AM  Depression screen PHQ 2/9  Decreased Interest 0 0 0 0 0  Down, Depressed, Hopeless 0 0 0 0 0  PHQ - 2 Score 0 0 0 0 0  Altered sleeping 0 0     Tired, decreased energy 0 0     Change in appetite 0 0     Feeling bad or failure about yourself  0 0     Trouble concentrating 0 0     Moving slowly or fidgety/restless 0 0     Suicidal thoughts 0 0     PHQ-9 Score 0 0          01/10/2020   10:35 AM 11/19/2020   11:35 AM 11/25/2021    3:33 PM 04/03/2022    9:43 AM 11/11/2022    4:04 PM  Fall Risk  Falls in the past year? 0 0 0 0 0  Was there an injury with Fall?  0 0 0 0  Fall Risk Category  Calculator  0 0 0 0  Fall Risk Category (Retired)  Low Low    (RETIRED) Patient Fall Risk Level  Low fall risk Low fall risk    Patient at Risk for Falls Due to   No Fall Risks;Medication side effect No Fall Risks   Fall risk Follow up  Falls evaluation completed Falls prevention discussed;Falls evaluation completed;Education provided Falls evaluation completed Falls evaluation completed     SUMMARY AND PLAN:  Encounter for Medicare annual wellness exam   Discussed applicable health maintenance/preventive health measures and advised and referred or ordered per patient preferences: -discussed vaccines due, she plans to get flu shot in October and then the covid shot a few weeks later (had covid this summer) - asked her to please let us know once done so that we can update her record Health Maintenance  Topic Date Due   COVID-19 Vaccine (8 - 2023-24 season) 10/19/2022   INFLUENZA VACCINE  05/18/2023 (Originally 09/18/2022)   Medicare Annual Wellness (AWV)  11/11/2023   DTaP/Tdap/Td (3 - Td or Tdap) 08/21/2025   Pneumonia Vaccine 71+ Years old  Completed   DEXA SCAN  Completed  Hepatitis C Screening  Completed   Zoster Vaccines- Shingrix  Completed   HPV VACCINES  Aged Out   Colonoscopy  Discontinued      Education and counseling on the following was provided based on the above review of health and a plan/checklist for the patient, along with additional information discussed, was provided for the patient in the patient instructions :   -Advised and counseled on a healthy lifestyle - including the importance of a healthy diet, regular physical activity, social connections and stress management. -Reviewed patient's current diet. Advised and counseled on a whole foods based healthy diet. A summary of a healthy diet was provided in the Patient Instructions.  -reviewed patient's current physical activity level and discussed exercise guidelines for adults. Discussed community resources and  ideas for safe exercise at home to assist in meeting exercise guideline recommendations in a safe and healthy way.  -Advise yearly dental visits at minimum and regular eye exams   Follow up: see patient instructions     Patient Instructions  I really enjoyed getting to talk with you today! I am available on Tuesdays and Thursdays for virtual visits if you have any questions or concerns, or if I can be of any further assistance.   CHECKLIST FROM ANNUAL WELLNESS VISIT:  -Follow up (please call to schedule if not scheduled after visit):   -yearly for annual wellness visit with primary care office  Here is a list of your preventive care/health maintenance measures and the plan for each if any are due:  PLAN For any measures below that may be due:  -can get the flu shot at any point -can get the covid vaccine 3 months after the covid infection -let us know when you get the vaccines so that we can update your record  Health Maintenance  Topic Date Due   COVID-19 Vaccine (8 - 2023-24 season) 10/19/2022   INFLUENZA VACCINE  05/18/2023 (Originally 09/18/2022)   Medicare Annual Wellness (AWV)  11/11/2023   DTaP/Tdap/Td (3 - Td or Tdap) 08/21/2025   Pneumonia Vaccine 34+ Years old  Completed   DEXA SCAN  Completed   Hepatitis C Screening  Completed   Zoster Vaccines- Shingrix  Completed   HPV VACCINES  Aged Out   Colonoscopy  Discontinued    -See a dentist at least yearly  -Get your eyes checked and then per your eye specialist's recommendations  -Other issues addressed today:   -I have included below further information regarding a healthy whole foods based diet, physical activity guidelines for adults, stress management and opportunities for social connections. I hope you find this information useful.    -----------------------------------------------------------------------------------------------------------------------------------------------------------------------------------------------------------------------------------------------------------  NUTRITION: -eat real food: lots of colorful vegetables (half the plate) and fruits -5-7 servings of vegetables and fruits per day (fresh or steamed is best), exp. 2 servings of vegetables with lunch and dinner and 2 servings of fruit per day. Berries and greens such as kale and collards are great choices.  -consume on a regular basis: whole grains (make sure first ingredient on label contains the word "whole"), fresh fruits, fish, nuts, seeds, healthy oils (such as olive oil, avocado oil, grape seed oil) -may eat small amounts of dairy and lean meat on occasion, but avoid processed meats such as ham, bacon, lunch meat, etc. -drink water -try to avoid fast food and pre-packaged foods, processed meat -most experts advise limiting sodium to < 2300mg  per day, should limit further is any chronic conditions such as high blood pressure, heart disease,  diabetes, etc. The American Heart Association advised that < 1500mg  is is ideal -try to avoid foods that contain any ingredients with names you do not recognize  -try to avoid sugar/sweets (except for the natural sugar that occurs in fresh fruit) -try to avoid sweet drinks -try to avoid white rice, white bread, pasta (unless whole grain), white or yellow potatoes  EXERCISE GUIDELINES FOR ADULTS: -if you wish to increase your physical activity, do so gradually and with the approval of your doctor -STOP and seek medical care immediately if you have any chest pain, chest discomfort or trouble breathing when starting or increasing exercise  -move and stretch your body, legs, feet and arms when sitting for long periods -Physical activity guidelines for optimal health in adults: -least 150 minutes per week of  aerobic exercise (can talk, but not sing) once approved by your doctor, 20-30 minutes of sustained activity or two 10 minute episodes of sustained activity every day.  -resistance training at least 2 days per week if approved by your doctor -balance exercises 3+ days per week:   Stand somewhere where you have something sturdy to hold onto if you lose balance.    1) lift up on toes, start with 5x per day and work up to 20x   2) stand and lift on leg straight out to the side so that foot is a few inches of the floor, start with 5x each side and work up to 20x each side   3) stand on one foot, start with 5 seconds each side and work up to 20 seconds on each side  If you need ideas or help with getting more active:  -Silver sneakers https://tools.silversneakers.com  -Walk with a Doc: http://www.duncan-williams.com/  -try to include resistance (weight lifting/strength building) and balance exercises twice per week: or the following link for ideas: http://castillo-powell.com/  BuyDucts.dk  STRESS MANAGEMENT: -can try meditating, or just sitting quietly with deep breathing while intentionally relaxing all parts of your body for 5 minutes daily -if you need further help with stress, anxiety or depression please follow up with your primary doctor or contact the wonderful folks at [facility]: [phone]  SOCIAL CONNECTIONS: -options in [place] if you wish to engage in more social and exercise related activities:  -Silver sneakers https://tools.silversneakers.com  -Walk with a Doc: http://www.duncan-williams.com/  -Check out the [facility] Active Adults 50+ section on the [place] [employer] (hiking clubs, book clubs, cards and games, chess, exercise classes, aquatic classes and much more) - see the website for  details: https://www.[place]-.gov/departments/parks-recreation/active-adults50  -YouTube has lots of exercise videos for different ages and abilities as well  -[facility] (a variety of indoor and outdoor inperson activities for adults). 205-784-2228. [address].  -Virtual Online Classes (a variety of topics): see seniorplanet.org or call (951)568-5960  -consider volunteering at a school, hospice center, church, senior center or elsewhere           Terressa Koyanagi, DO

## 2022-11-11 NOTE — Progress Notes (Signed)
 Per patient no change in vitals since last visit, unable to obtain new vitals due to telehealth visit

## 2023-01-20 ENCOUNTER — Ambulatory Visit: Payer: Medicare Other | Admitting: Family Medicine

## 2023-01-20 ENCOUNTER — Encounter: Payer: Self-pay | Admitting: Family Medicine

## 2023-01-20 VITALS — BP 128/92 | HR 84 | Ht 65.0 in | Wt 150.0 lb

## 2023-01-20 DIAGNOSIS — M545 Low back pain, unspecified: Secondary | ICD-10-CM | POA: Diagnosis not present

## 2023-01-20 NOTE — Patient Instructions (Signed)
IBU 400mg  2dx a day is ok See me in 3-4 months

## 2023-01-20 NOTE — Progress Notes (Signed)
[name] [address] [place] [address] Phone: [phone] Subjective:   Bruce Donath, am serving as a scribe for Dr. Antoine Primas.  I'm seeing this patient by the request  of:  Panosh, Neta Mends, MD  CC: Low back pain follow-up  [id number]  10/08/2022 Low back does have some loss of lordosis noted.  Patient though has been doing very well.  No pain over the sides of the hip at the moment.  Discussed icing regimen and home exercises.  Continue to stay active as well.      Update 01/20/2023 Cheryl Robbins is a 77 y.o. female coming in with complaint of lumbar spine pain. Patient notes an increase in pain with barometric pressure changes. Had to take IBU 600mg  as her entire body hurt. Doing ok today. Back pain has been manageable since last visit.     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   Primiparous    Rosacea    Past Surgical History:  Procedure Laterality Date   atypical moles     bilateral salpingoopherectomy     for r 4cm clear r adnexal mass 27-Jan-2003 serous cystadenoma   CATARACT EXTRACTION     Both eyes   COLONOSCOPY     EYE SURGERY     OOPHORECTOMY     tonsilletomy  1953   Social History   Socioeconomic History   Marital status: Unknown    Spouse name: Not on file   Number of children: Not on file   Years of education: Not on file   Highest education level: Not on file  Occupational History   Occupation: Retired  Tobacco Use   Smoking status: Never   Smokeless tobacco: Never  Vaping Use   Vaping status: Never Used  Substance and Sexual Activity   Alcohol use: No   Drug use: No   Sexual activity: Not on file  Other Topics Concern   Not on file  Social History Narrative   hh of 1   No  pets   No ets.   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      Mom age 61 friends home snif recently af TKR infection passed away 2016-01-27    Social Determinants of Health   Financial Resource Strain: Low Risk  (11/25/2021)   Overall Financial Resource Strain (CARDIA)    Difficulty of Paying Living Expenses: Not hard at all  Food Insecurity: No Food Insecurity (11/25/2021)   Hunger Vital Sign    Worried About Running Out of Food in the Last Year: Never true    Ran Out of Food in the Last Year: Never true  Transportation Needs: No Transportation Needs (11/25/2021)   PRAPARE - Administrator, Civil Service (Medical): No    Lack of Transportation (Non-Medical): No  Physical Activity: Insufficiently Active (11/25/2021)   Exercise Vital Sign    Days of Exercise per Week: 4 days    Minutes of Exercise per Session: 30 min  Stress: No Stress Concern Present (11/25/2021)   [employer] of Occupational Health - Occupational Stress Questionnaire  Feeling of Stress : Not at all  Social Connections: Socially Isolated (11/19/2020)   Social Connection and Isolation Panel [NHANES]    Frequency of Communication with Friends and Family: Twice a week    Frequency of Social Gatherings with Friends and Family: Twice a week    Attends Religious Services: Never    Database administrator or Organizations: No    Attends Banker Meetings: Never    Marital Status: Widowed   Allergies  Allergen Reactions   Penicillins     Gums swelled up and turned blue   Ace Inhibitors Cough    Went away when med changed    Clindamycin/Lincomycin     Had rash with diflucan and clind but cant tell which one it was  As per dr Haroldine Laws   Sulfonamide Derivatives    Simvastatin Other (See Comments)    Body leg aches  On simva and doxy better off see July 17 note   Family History  Problem Relation Age of Onset   Atrial fibrillation  Mother        had cv in her 55 s   Arthritis Mother        had staph infection knee replacment   Cancer Sister        thryoid   Fibromyalgia Brother    Colon cancer Neg Hx    Rectal cancer Neg Hx    Stomach cancer Neg Hx     Current Outpatient Medications (Endocrine & Metabolic):    levothyroxine (SYNTHROID) 25 MCG tablet, TAKE 1 TABLET BY MOUTH EVERY DAY BEFORE breakfast  Current Outpatient Medications (Cardiovascular):    atorvastatin (LIPITOR) 10 MG tablet, Take 1 tablet (10 mg total) by mouth daily.   EPINEPHrine 0.3 mg/0.3 mL IJ SOAJ injection, USE AS DIRECTED AS NEEDED   telmisartan (MICARDIS) 40 MG tablet, Take 40 mg by mouth daily.   telmisartan-hydrochlorothiazide (MICARDIS HCT) 40-12.5 MG tablet, Take 1 tablet by mouth daily.  Current Outpatient Medications (Respiratory):    fluticasone (FLONASE) 50 MCG/ACT nasal spray, Place into both nostrils daily.   guaiFENesin (MUCINEX) 600 MG 12 hr tablet, Take by mouth 2 (two) times daily.   loratadine (CLARITIN) 10 MG tablet, Take 10 mg by mouth as needed.  Current Outpatient Medications (Analgesics):    acetaminophen (TYLENOL) 500 MG tablet, Take 1,000 mg by mouth every 6 (six) hours as needed.   ibuprofen (ADVIL,MOTRIN) 200 MG tablet, Take 200 mg by mouth every 6 (six) hours as needed.   Current Outpatient Medications (Other):    Ascorbic Acid (VITAMIN C) 1000 MG tablet, Take 1,000 mg by mouth daily.   beta carotene w/minerals (OCUVITE) tablet, Take 1 tablet by mouth daily.   Calcium Carbonate-Vitamin D 600-400 MG-UNIT tablet, Take 1 tablet by mouth daily.   co-enzyme Q-10 50 MG capsule, Take 150 mg by mouth daily.   gabapentin (NEURONTIN) 100 MG capsule, TAKE 2 CAPSULES BY MOUTH AT BEDTIME   TART CHERRY PO, Take by mouth.   Turmeric 500 MG TABS, Take by mouth.   VITAMIN D, CHOLECALCIFEROL, PO, Take by mouth.   Review of Systems:  No headache, visual changes, nausea, vomiting, diarrhea, constipation, dizziness, abdominal  pain, skin rash, fevers, chills, night sweats, weight loss, swollen lymph nodes, body aches, joint swelling, chest pain, shortness of breath, mood changes. POSITIVE muscle aches  Objective  Blood pressure (!) 128/92, pulse 84, height 5\' 5"  (1.651 m), weight 150 lb (68 kg), SpO2 97%.   General: No apparent  distress alert and oriented x3 mood and affect normal, dressed appropriately.  HEENT: Pupils equal, extraocular movements intact  Respiratory: Patient's speak in full sentences and does not appear short of breath  Cardiovascular: No lower extremity edema, non tender, no erythema  Low back exam does have some loss lordosis noted.  Tenderness to palpation mildly on overall patient is sitting comfortably.    Impression and Recommendations:    The above documentation has been reviewed and is accurate and complete Judi Saa, DO

## 2023-01-20 NOTE — Assessment & Plan Note (Signed)
Low back exam does have some loss of lordosis still noted but doing very well with the conservative therapy.  Has had bursitis of the hips previously.  Patient has been taking ibuprofen 200 mg twice a day but nothing more than that.  Patient has had sulfur allergies.  Did discuss the possibility of trying Celebrex if necessary.  Increase activity slowly otherwise.  Follow-up with

## 2023-01-28 LAB — HM MAMMOGRAPHY

## 2023-01-29 ENCOUNTER — Encounter: Payer: Self-pay | Admitting: Internal Medicine

## 2023-04-07 ENCOUNTER — Telehealth: Payer: Self-pay

## 2023-04-07 ENCOUNTER — Other Ambulatory Visit: Payer: Self-pay | Admitting: Internal Medicine

## 2023-04-07 NOTE — Telephone Encounter (Signed)
Copied from CRM [id number]. Topic: Clinical - Prescription Issue >> Apr 07, 2023 10:43 AM Mackie Pai E wrote: Reason for CRM: Jacki Cones from [facility] called stating that patient's levothyroxine (SYNTHROID) 25 MCG tablet has been recalled, due to the manufacturer [facility]) being recalled. Jacki Cones stated that she will need to change manufacturers due to this issue.

## 2023-04-09 NOTE — Telephone Encounter (Signed)
Contacted pharmacy and spoke to [employer]. Give her ok to change per Dr. Fabian Sharp.   Verbalized understanding. No further action is needed.

## 2023-04-21 NOTE — Progress Notes (Signed)
 Chief Complaint  Patient presents with   Annual Exam    HPI: Patient  Cheryl Robbins  78 y.o. comes in today for Preventive Health Care visit  and >CDM  Bp   taking med   on meds no se  ? Trending jp  has gained th 5 # that may be effecting bp control  HLD no se of med  Had 2 virus long acting early in a year and  feels added weight caould  cause   bp elevation  Thyroid  no concerns dialy meds  No polio post  new sx usually left leg effected  Had mammogram category b  calcification was 0 asks about this interpretation  Health Maintenance  Topic Date Due   INFLUENZA VACCINE  05/18/2023 (Originally 09/18/2022)   COVID-19 Vaccine (9 - 2024-25 season) 01/22/2024 (Originally 02/16/2023)   Medicare Annual Wellness (AWV)  11/11/2023   DTaP/Tdap/Td (3 - Td or Tdap) 08/21/2025   Pneumonia Vaccine 68+ Years old  Completed   DEXA SCAN  Completed   Hepatitis C Screening  Completed   Zoster Vaccines- Shingrix  Completed   HPV VACCINES  Aged Out   Colonoscopy  Discontinued   Health Maintenance Review LIFESTYLE:  Exercise:   walking  Tobacco/ETS: n Alcohol:  no Sugar beverages: maybe once a week   Sleep: 7.5-8.5  Drug use: no HH of  1  no pets  To [place] every other weeks  GGC   14k   ROS:  GEN/ HEENT: No fever, significant weight changes sweats headaches vision problems hearing changes, CV/ PULM; No chest pain shortness of breath cough, syncope,edema  change in exercise tolerance. GI /GU: No adominal pain, vomiting, change in bowel habits. No blood in the stool. No significant GU symptoms. SKIN/HEME: ,no acute skin rashes suspicious lesions or bleeding. No lymphadenopathy, nodules, masses.  NEURO/ PSYCH:  No neurologic signs such as weakness numbness. No depression anxiety. IMM/ Allergy: No unusual infections.  Allergy .   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   Primiparous    Rosacea     Past Surgical History:  Procedure Laterality Date   atypical moles     bilateral salpingoopherectomy     for r 4cm clear r adnexal mass 2004 serous cystadenoma   CATARACT EXTRACTION     Both eyes   COLONOSCOPY     EYE SURGERY     OOPHORECTOMY     tonsilletomy  1953    Family History  Problem Relation Age of Onset   Atrial fibrillation Mother        had cv in her 73 s   Arthritis Mother        had staph infection knee replacment   Cancer Sister        thryoid   Fibromyalgia Brother    Colon cancer Neg Hx    Rectal cancer Neg Hx    Stomach cancer Neg Hx     Social History   Socioeconomic History   Marital status: Unknown    Spouse name: Not on file   Number of children: Not on file   Years of  education: Not on file   Highest education level: Master's degree (e.g., MA, MS, MEng, MEd, MSW, MBA)  Occupational History   Occupation: Retired  Tobacco Use   Smoking status: Never   Smokeless tobacco: Never  Vaping Use   Vaping status: Never Used  Substance and Sexual Activity   Alcohol use: No   Drug use: No   Sexual activity: Not on file  Other Topics Concern   Not on file  Social History Narrative   hh of 1   No pets   No ets.   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      Mom age 65 friends home snif recently af TKR infection passed away 2015-05-08    Social Drivers of Health   Financial Resource Strain: Low Risk  (04/22/2023)   Overall Financial Resource Strain (CARDIA)    Difficulty of Paying Living Expenses: Not hard at all  Food Insecurity: No Food Insecurity (04/22/2023)   Hunger Vital Sign    Worried About Running Out of Food in the Last Year: Never true    Ran Out of Food in the Last Year: Never true   Transportation Needs: No Transportation Needs (04/22/2023)   PRAPARE - Administrator, Civil Service (Medical): No    Lack of Transportation (Non-Medical): No  Physical Activity: Insufficiently Active (04/22/2023)   Exercise Vital Sign    Days of Exercise per Week: 4 days    Minutes of Exercise per Session: 30 min  Stress: No Stress Concern Present (04/22/2023)   [employer] of Occupational Health - Occupational Stress Questionnaire    Feeling of Stress : Not at all  Social Connections: Moderately Isolated (04/22/2023)   Social Connection and Isolation Panel [NHANES]    Frequency of Communication with Friends and Family: More than three times a week    Frequency of Social Gatherings with Friends and Family: More than three times a week    Attends Religious Services: More than 4 times per year    Active Member of [employer] or Organizations: No    Attends Banker Meetings: Not on file    Marital Status: Widowed    Outpatient Medications Prior to Visit  Medication Sig Dispense Refill   acetaminophen (TYLENOL) 500 MG tablet Take 1,000 mg by mouth every 6 (six) hours as needed.     Ascorbic Acid (VITAMIN C) 1000 MG tablet Take 1,000 mg by mouth daily.     atorvastatin (LIPITOR) 10 MG tablet TAKE 1 TABLET BY MOUTH EVERY DAY 90 tablet 3   beta carotene w/minerals (OCUVITE) tablet Take 1 tablet by mouth daily.     Calcium Carbonate-Vitamin D 600-400 MG-UNIT tablet Take 1 tablet by mouth daily.     co-enzyme Q-10 50 MG capsule Take 150 mg by mouth daily.     EPINEPHrine 0.3 mg/0.3 mL IJ SOAJ injection USE AS DIRECTED AS NEEDED     fluticasone (FLONASE) 50 MCG/ACT nasal spray Place into both nostrils daily.     guaiFENesin (MUCINEX) 600 MG 12 hr tablet Take by mouth 2 (two) times daily.     ibuprofen (ADVIL,MOTRIN) 200 MG tablet Take 200 mg by mouth every 6 (six) hours as needed.     levothyroxine (SYNTHROID) 25 MCG tablet TAKE 1 TABLET BY MOUTH EVERY DAY BEFORE BREAKFAST  90 tablet 3   loratadine (CLARITIN) 10 MG tablet Take 10 mg by mouth  as needed.     TART CHERRY PO Take by mouth.     telmisartan-hydrochlorothiazide (MICARDIS HCT) 40-12.5 MG tablet TAKE 1 TABLET BY MOUTH EVERY DAY 90 tablet 3   Turmeric 500 MG TABS Take by mouth.     VITAMIN D, CHOLECALCIFEROL, PO Take by mouth.     gabapentin (NEURONTIN) 100 MG capsule TAKE 2 CAPSULES BY MOUTH AT BEDTIME (Patient not taking: Reported on 04/22/2023) 60 capsule 2   telmisartan (MICARDIS) 40 MG tablet Take 40 mg by mouth daily. (Patient not taking: Reported on 04/22/2023)     No facility-administered medications prior to visit.     EXAM:  BP (!) 150/86 (BP Location: Left Arm, Patient Position: Sitting, Cuff Size: Large)   Pulse 92   Temp 98 F (36.7 C) (Oral)   Ht 5' 4.25" (1.632 m)   Wt 162 lb 12.8 oz (73.8 kg)   SpO2 97%   BMI 27.73 kg/m   Body mass index is 27.73 kg/m. Wt Readings from Last 3 Encounters:  04/22/23 162 lb 12.8 oz (73.8 kg)  01/20/23 150 lb (68 kg)  11/11/22 150 lb (68 kg)    Physical Exam: Vital signs reviewed [id number] is a well-developed well-nourished alert cooperative    who appearsr stated age in no acute distress.  HEENT: normocephalic atraumatic , Eyes: PERRL EOM's full, conjunctiva clear, Nares: paten,t no deformity discharge or tenderness., Ears: no deformity EAC's clear TMs with normal landmarks. Mouth: clear OP, no lesions, edema.  Moist mucous membranes. Dentition in adequate repair. NECK: supple without masses, thyromegaly or bruits. CHEST/PULM:  Clear to auscultation and percussion breath sounds equal no wheeze , rales or rhonchi. No chest wall deformities or tenderness. Breast: normal by inspection . No dimpling, discharge, masses, tenderness or discharge . CV: PMI is nondisplaced, S1 S2 no gallops, murmurs, rubs. Peripheral pulses are full without delay.No JVD   ABDOMEN: Bowel sounds normal nontender  No guard or rebound, no hepato splenomegal no CVA tenderness.   No hernia. Extremtities:  No clubbing cyanosis or edema, no acute joint swelling or redness no focal atrophy NEURO:  Oriented x3, cranial nerves 3-12 appear to be intact, gait within normal limits no abnormal reflexes or asymmetrical SKIN: No acute rashes normal turgor, color, no bruising or petechiae. PSYCH: Oriented, good eye contact, no obvious depression anxiety, cognition and judgment appear normal. LN: no cervical axillary i adenopathy  Lab Results  Component Value Date   WBC 6.4 04/22/2023   HGB 15.1 (H) 04/22/2023   HCT 45.0 04/22/2023   PLT 186.0 04/22/2023   GLUCOSE 100 (H) 04/22/2023   CHOL 149 04/22/2023   TRIG 185.0 (H) 04/22/2023   HDL 44.70 04/22/2023   LDLDIRECT 171.0 06/17/2006   LDLCALC 67 04/22/2023   ALT 21 04/22/2023   AST 17 04/22/2023   NA 138 04/22/2023   K 3.9 04/22/2023   CL 99 04/22/2023   CREATININE 0.87 04/22/2023   BUN 19 04/22/2023   CO2 31 04/22/2023   TSH 2.04 04/22/2023   HGBA1C 5.4 04/22/2023   Mammogram  neg calcim BP Readings from Last 3 Encounters:  04/22/23 (!) 150/86  01/20/23 (!) 128/92  11/11/22 122/82    Labplanreviewed with patient  update  non fasting  today ate 1 pm w water   ASSESSMENT AND PLAN:  Discussed the following assessment and plan:    ICD-10-CM   1. Visit for preventive health examination  Z00.00     2. POLIOMYELITIS, HX OF  Z86.12 CBC with  Differential/Platelet    Comprehensive metabolic panel    Lipid panel    TSH    Hemoglobin A1c    T4, Free    3. Essential hypertension  I10 CBC with Differential/Platelet    Comprehensive metabolic panel    Lipid panel    TSH    Hemoglobin A1c    T4, Free    4. Mixed hyperlipidemia  E78.2 CBC with Differential/Platelet    Comprehensive metabolic panel    Lipid panel    TSH    Hemoglobin A1c    T4, Free    5. Medication management  Z79.899 CBC with Differential/Platelet    Comprehensive metabolic panel    Lipid panel    TSH    Hemoglobin A1c    T4, Free     6. Hypothyroidism, unspecified type  E03.9 CBC with Differential/Platelet    Comprehensive metabolic panel    Lipid panel    TSH    Hemoglobin A1c    T4, Free    7. 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  She will work on some healthy weight loss which will also help control  No change in other meds unless  indicate Breast calcium vasxcular is zero  9   never had cct calcium score and not sure would add to decisoin making at this time   x perhaps  goal change  for ldl . Return in about 3 months (around 07/23/2023) for BP monitoring and bring in monitor to visit .  Patient Care Team: Madeliene Tejera, Neta Mends, MD as PCP - General Haroldine Laws Gasper Lloyd, MD (Otolaryngology) Nita Sells, MD (Dermatology) Ranee Gosselin, MD (Orthopedic Surgery) ed Renae Fickle (Ophthalmology) Eileen Stanford, MD as Consulting Physician (Allergy and Immunology) Patient Instructions  Take increase dose   1.5 of th telmisartan hydrochlorothiazide  and  monitor bp readings  Intensify lifestyle interventions.  In interim   Lab today non fasting.  Bp goal average below 140/90   Burna Mortimer K. Karem Tomaso M.D.

## 2023-04-22 ENCOUNTER — Ambulatory Visit: Payer: Medicare Other | Admitting: Internal Medicine

## 2023-04-22 VITALS — BP 150/86 | HR 92 | Temp 98.0°F | Ht 64.25 in | Wt 162.8 lb

## 2023-04-22 DIAGNOSIS — M949 Disorder of cartilage, unspecified: Secondary | ICD-10-CM

## 2023-04-22 DIAGNOSIS — E782 Mixed hyperlipidemia: Secondary | ICD-10-CM | POA: Diagnosis not present

## 2023-04-22 DIAGNOSIS — Z0000 Encounter for general adult medical examination without abnormal findings: Secondary | ICD-10-CM

## 2023-04-22 DIAGNOSIS — E039 Hypothyroidism, unspecified: Secondary | ICD-10-CM | POA: Diagnosis not present

## 2023-04-22 DIAGNOSIS — I10 Essential (primary) hypertension: Secondary | ICD-10-CM | POA: Diagnosis not present

## 2023-04-22 DIAGNOSIS — Z8612 Personal history of poliomyelitis: Secondary | ICD-10-CM

## 2023-04-22 DIAGNOSIS — M899 Disorder of bone, unspecified: Secondary | ICD-10-CM

## 2023-04-22 DIAGNOSIS — Z79899 Other long term (current) drug therapy: Secondary | ICD-10-CM

## 2023-04-22 NOTE — Patient Instructions (Addendum)
 Take increase dose   1.5 of th telmisartan hydrochlorothiazide  and  monitor bp readings  Intensify lifestyle interventions.  In interim   Lab today non fasting.  Bp goal average below 140/90

## 2023-04-23 LAB — COMPREHENSIVE METABOLIC PANEL
ALT: 21 U/L (ref 0–35)
AST: 17 U/L (ref 0–37)
Albumin: 4 g/dL (ref 3.5–5.2)
Alkaline Phosphatase: 51 U/L (ref 39–117)
BUN: 19 mg/dL (ref 6–23)
CO2: 31 meq/L (ref 19–32)
Calcium: 9.2 mg/dL (ref 8.4–10.5)
Chloride: 99 meq/L (ref 96–112)
Creatinine, Ser: 0.87 mg/dL (ref 0.40–1.20)
GFR: 63.74 mL/min (ref >60.00)
Glucose, Bld: 100 mg/dL — ABNORMAL HIGH (ref 70–99)
Potassium: 3.9 meq/L (ref 3.5–5.1)
Sodium: 138 meq/L (ref 135–145)
Total Bilirubin: 0.5 mg/dL (ref 0.2–1.2)
Total Protein: 6.1 g/dL (ref 6.0–8.3)

## 2023-04-23 LAB — CBC WITH DIFFERENTIAL/PLATELET
Basophils Absolute: 0 10*3/uL (ref 0.0–0.1)
Basophils Relative: 0.5 % (ref 0.0–3.0)
Eosinophils Absolute: 0.4 10*3/uL (ref 0.0–0.7)
Eosinophils Relative: 6.7 % — ABNORMAL HIGH (ref 0.0–5.0)
HCT: 45 % (ref 36.0–46.0)
Hemoglobin: 15.1 g/dL — ABNORMAL HIGH (ref 12.0–15.0)
Lymphocytes Relative: 19.3 % (ref 12.0–46.0)
Lymphs Abs: 1.2 10*3/uL (ref 0.7–4.0)
MCHC: 33.5 g/dL (ref 30.0–36.0)
MCV: 85.9 fl (ref 78.0–100.0)
Monocytes Absolute: 0.5 10*3/uL (ref 0.1–1.0)
Monocytes Relative: 7.7 % (ref 3.0–12.0)
Neutro Abs: 4.2 10*3/uL (ref 1.4–7.7)
Neutrophils Relative %: 65.8 % (ref 43.0–77.0)
Platelets: 186 10*3/uL (ref 150.0–400.0)
RBC: 5.24 Mil/uL — ABNORMAL HIGH (ref 3.87–5.11)
RDW: 14 % (ref 11.5–15.5)
WBC: 6.4 10*3/uL (ref 4.0–10.5)

## 2023-04-23 LAB — LIPID PANEL
Cholesterol: 149 mg/dL (ref 0–200)
HDL: 44.7 mg/dL (ref >39.00)
LDL Cholesterol: 67 mg/dL (ref 0–99)
NonHDL: 104.15
Total CHOL/HDL Ratio: 3
Triglycerides: 185 mg/dL — ABNORMAL HIGH (ref 0.0–149.0)
VLDL: 37 mg/dL (ref 0.0–40.0)

## 2023-04-23 LAB — TSH: TSH: 2.04 u[IU]/mL (ref 0.35–5.50)

## 2023-04-23 LAB — HEMOGLOBIN A1C: Hgb A1c MFr Bld: 5.4 % (ref 4.6–6.5)

## 2023-04-23 LAB — T4, FREE: Free T4: 1.05 ng/dL (ref 0.60–1.60)

## 2023-04-24 MED ORDER — TELMISARTAN-HCTZ 40-12.5 MG PO TABS: 1.5000 | ORAL_TABLET | Freq: Every day | ORAL | 3 refills | Status: DC

## 2023-04-27 ENCOUNTER — Encounter: Payer: Self-pay | Admitting: Internal Medicine

## 2023-04-27 NOTE — Progress Notes (Signed)
 Thyroid in range  blood sugar borderline but no diabetes . Continue lifestyle intervention healthy eating and activity as planned and should help   . Rest of results stable or clinically insignificant

## 2023-04-30 NOTE — Progress Notes (Deleted)
 [name] [address] [place] [address] Phone: [phone] Subjective:    I'm seeing this patient by the request  of:  Panosh, Neta Mends, MD  CC:   [id number]  01/20/2023 Low back exam does have some loss of lordosis still noted but doing very well with the conservative therapy.  Has had bursitis of the hips previously.  Patient has been taking ibuprofen 200 mg twice a day but nothing more than that.  Patient has had sulfur allergies.  Did discuss the possibility of trying Celebrex if necessary.  Increase activity slowly otherwise.  Follow-up with    Update 05/05/2022 Cheryl Robbins is a 78 y.o. female coming in with complaint of lumbar spine pain. 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   Primiparous    Rosacea    Past Surgical History:  Procedure Laterality Date   atypical moles     bilateral salpingoopherectomy     for r 4cm clear r adnexal mass 28-May-2002 serous cystadenoma   CATARACT EXTRACTION     Both eyes   COLONOSCOPY     EYE SURGERY     OOPHORECTOMY     tonsilletomy  1953   Social History   Socioeconomic History   Marital status: Unknown    Spouse name: Not on file   Number of children: Not on file   Years of education: Not on file   Highest education level: Master's degree (e.g., MA, MS, MEng, MEd, MSW, MBA)  Occupational History   Occupation: Retired  Tobacco Use   Smoking status: Never   Smokeless tobacco: Never  Vaping Use   Vaping status: Never Used  Substance and Sexual Activity   Alcohol use: No   Drug use: No   Sexual activity: Not on file  Other Topics Concern   Not on file  Social History Narrative   hh of 1   No pets   No ets.   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      Mom age 60 friends home snif recently af TKR infection passed away 28-May-2015    Social Drivers of Health   Financial Resource Strain: Low Risk  (04/22/2023)   Overall Financial Resource Strain (CARDIA)    Difficulty of Paying Living Expenses: Not hard at all  Food Insecurity: No Food Insecurity (04/22/2023)   Hunger Vital Sign    Worried About Running Out of Food in the Last Year: Never true    Ran Out of Food in the Last Year: Never true  Transportation Needs: No Transportation Needs (04/22/2023)   PRAPARE - Administrator, Civil Service (Medical): No    Lack of Transportation (Non-Medical): No  Physical Activity: Insufficiently Active (04/22/2023)   Exercise Vital Sign    Days of Exercise per Week: 4 days    Minutes of Exercise per Session: 30 min  Stress: No Stress Concern Present (04/22/2023)   [employer] of Occupational Health - Occupational Stress Questionnaire    Feeling of Stress : Not at all  Social Connections: Moderately Isolated (  04/22/2023)   Social Connection and Isolation Panel [NHANES]    Frequency of Communication with Friends and Family: More than three times a week    Frequency of Social Gatherings with Friends and Family: More than three times a week    Attends Religious Services: More than 4 times per year    Active Member of [employer] or Organizations: No    Attends Banker Meetings: Not on file    Marital Status: Widowed   Allergies  Allergen Reactions   Penicillins     Gums swelled up and turned blue   Ace Inhibitors Cough    Went away when med changed    Clindamycin/Lincomycin     Had rash with diflucan and clind but cant tell which one it was  As per dr Haroldine Laws   Sulfonamide Derivatives    Diflucan [Fluconazole] Rash   Simvastatin Other (See Comments)    Body leg aches  On simva and doxy better off see July 17 note   Family History  Problem Relation  Age of Onset   Atrial fibrillation Mother        had cv in her 28 s   Arthritis Mother        had staph infection knee replacment   Cancer Sister        thryoid   Fibromyalgia Brother    Colon cancer Neg Hx    Rectal cancer Neg Hx    Stomach cancer Neg Hx     Current Outpatient Medications (Endocrine & Metabolic):    levothyroxine (SYNTHROID) 25 MCG tablet, TAKE 1 TABLET BY MOUTH EVERY DAY BEFORE BREAKFAST  Current Outpatient Medications (Cardiovascular):    atorvastatin (LIPITOR) 10 MG tablet, TAKE 1 TABLET BY MOUTH EVERY DAY   EPINEPHrine 0.3 mg/0.3 mL IJ SOAJ injection, USE AS DIRECTED AS NEEDED   telmisartan-hydrochlorothiazide (MICARDIS HCT) 40-12.5 MG tablet, Take 1.5 tablets by mouth daily. Dosage change  Current Outpatient Medications (Respiratory):    fluticasone (FLONASE) 50 MCG/ACT nasal spray, Place into both nostrils daily.   guaiFENesin (MUCINEX) 600 MG 12 hr tablet, Take by mouth 2 (two) times daily.   loratadine (CLARITIN) 10 MG tablet, Take 10 mg by mouth as needed.  Current Outpatient Medications (Analgesics):    acetaminophen (TYLENOL) 500 MG tablet, Take 1,000 mg by mouth every 6 (six) hours as needed.   ibuprofen (ADVIL,MOTRIN) 200 MG tablet, Take 200 mg by mouth every 6 (six) hours as needed.   Current Outpatient Medications (Other):    Ascorbic Acid (VITAMIN C) 1000 MG tablet, Take 1,000 mg by mouth daily.   beta carotene w/minerals (OCUVITE) tablet, Take 1 tablet by mouth daily.   Calcium Carbonate-Vitamin D 600-400 MG-UNIT tablet, Take 1 tablet by mouth daily.   co-enzyme Q-10 50 MG capsule, Take 150 mg by mouth daily.   gabapentin (NEURONTIN) 100 MG capsule, TAKE 2 CAPSULES BY MOUTH AT BEDTIME (Patient not taking: Reported on 04/22/2023)   TART CHERRY PO, Take by mouth.   Turmeric 500 MG TABS, Take by mouth.   VITAMIN D, CHOLECALCIFEROL, PO, Take by mouth.   Reviewed prior external information including notes and imaging from  primary care  provider As well as notes that were available from care everywhere and other healthcare systems.  Past medical history, social, surgical and family history all reviewed in electronic medical record.  No pertanent information unless stated regarding to the chief complaint.   Review of Systems:  No headache, visual changes, nausea, vomiting, diarrhea, constipation,  dizziness, abdominal pain, skin rash, fevers, chills, night sweats, weight loss, swollen lymph nodes, body aches, joint swelling, chest pain, shortness of breath, mood changes. POSITIVE muscle aches  Objective  There were no vitals taken for this visit.   General: No apparent distress alert and oriented x3 mood and affect normal, dressed appropriately.  HEENT: Pupils equal, extraocular movements intact  Respiratory: Patient's speak in full sentences and does not appear short of breath  Cardiovascular: No lower extremity edema, non tender, no erythema      Impression and Recommendations:

## 2023-05-05 ENCOUNTER — Ambulatory Visit: Payer: Medicare Other | Admitting: Family Medicine

## 2023-07-03 NOTE — Progress Notes (Signed)
 [facility] [address] [place] [address] Phone: [phone] Subjective:   Delwyn Filippo, am serving as a scribe for Dr. Ronnell Coins.  I'm seeing this patient by the request  of:  Panosh, Joaquim Muir, MD  CC: Low back pain  [id number]  01/20/2023 Low back exam does have some loss of lordosis still noted but doing very well with the conservative therapy.  Has had bursitis of the hips previously.  Patient has been taking ibuprofen 200 mg twice a day but nothing more than that.  Patient has had sulfur allergies.  Did discuss the possibility of trying Celebrex if necessary.  Increase activity slowly otherwise.  Follow-up with      Update 07/06/2023 MONIQUA ENGEBRETSEN is a 78 y.o. female coming in with complaint of lumbar spine pain. Patient states her back has been doing well. Has had a couple of lower back spasms in which she could not extend her back from a 90 degree position. Happened one other time.  Has also been having some left shoulder pain.  States certain movements gives her significant amount of discomfort.  Seems to be things like reaching behind her back or sometimes even trying to put on a jacket.  Denies any weakness, denies any nighttime pain.      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   Primiparous    Rosacea    Past Surgical History:  Procedure Laterality Date   atypical moles     bilateral salpingoopherectomy     for r 4cm clear r adnexal mass 2002/08/19 serous cystadenoma   CATARACT EXTRACTION     Both eyes   COLONOSCOPY     EYE SURGERY     OOPHORECTOMY     tonsilletomy  1953   Social History   Socioeconomic History   Marital status: Unknown    Spouse name: Not on file   Number of children: Not on  file   Years of education: Not on file   Highest education level: Master's degree (e.g., MA, MS, MEng, MEd, MSW, MBA)  Occupational History   Occupation: Retired  Tobacco Use   Smoking status: Never   Smokeless tobacco: Never  Vaping Use   Vaping status: Never Used  Substance and Sexual Activity   Alcohol use: No   Drug use: No   Sexual activity: Not on file  Other Topics Concern   Not on file  Social History Narrative   hh of 1   No pets   No ets.   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      Mom age 59 friends home snif recently af TKR infection passed away 2015-08-19    Social Drivers of Health   Financial Resource Strain: Low Risk  (04/22/2023)   Overall Financial Resource Strain (CARDIA)    Difficulty of Paying Living Expenses: Not hard at all  Food Insecurity: No Food Insecurity (04/22/2023)   Hunger Vital Sign    Worried About Running Out of Food in the Last Year: Never true    Ran Out of Food in the Last Year: Never  true  Transportation Needs: No Transportation Needs (04/22/2023)   PRAPARE - Administrator, Civil Service (Medical): No    Lack of Transportation (Non-Medical): No  Physical Activity: Insufficiently Active (04/22/2023)   Exercise Vital Sign    Days of Exercise per Week: 4 days    Minutes of Exercise per Session: 30 min  Stress: No Stress Concern Present (04/22/2023)   [employer] of Occupational Health - Occupational Stress Questionnaire    Feeling of Stress : Not at all  Social Connections: Moderately Isolated (04/22/2023)   Social Connection and Isolation Panel [NHANES]    Frequency of Communication with Friends and Family: More than three times a week    Frequency of Social Gatherings with Friends and Family: More than three times a week    Attends Religious Services: More than 4 times per year    Active Member of [employer] or Organizations: No    Attends Banker  Meetings: Not on file    Marital Status: Widowed   Allergies  Allergen Reactions   Penicillins     Gums swelled up and turned blue   Ace Inhibitors Cough    Went away when med changed    Clindamycin/Lincomycin     Had rash with diflucan  and clind but cant tell which one it was  As per dr Franklin Ito   Sulfonamide Derivatives    Diflucan  [Fluconazole ] Rash   Simvastatin  Other (See Comments)    Body leg aches  On simva and doxy better off see July 17 note   Family History  Problem Relation Age of Onset   Atrial fibrillation Mother        had cv in her 70 s   Arthritis Mother        had staph infection knee replacment   Cancer Sister        thryoid   Fibromyalgia Brother    Colon cancer Neg Hx    Rectal cancer Neg Hx    Stomach cancer Neg Hx     Current Outpatient Medications (Endocrine & Metabolic):    levothyroxine  (SYNTHROID ) 25 MCG tablet, TAKE 1 TABLET BY MOUTH EVERY DAY BEFORE BREAKFAST  Current Outpatient Medications (Cardiovascular):    atorvastatin  (LIPITOR) 10 MG tablet, TAKE 1 TABLET BY MOUTH EVERY DAY   EPINEPHrine 0.3 mg/0.3 mL IJ SOAJ injection, USE AS DIRECTED AS NEEDED   telmisartan -hydrochlorothiazide  (MICARDIS  HCT) 40-12.5 MG tablet, Take 1.5 tablets by mouth daily. Dosage change  Current Outpatient Medications (Respiratory):    fluticasone (FLONASE) 50 MCG/ACT nasal spray, Place into both nostrils daily.   guaiFENesin (MUCINEX) 600 MG 12 hr tablet, Take by mouth 2 (two) times daily.   loratadine (CLARITIN) 10 MG tablet, Take 10 mg by mouth as needed.  Current Outpatient Medications (Analgesics):    acetaminophen (TYLENOL) 500 MG tablet, Take 1,000 mg by mouth every 6 (six) hours as needed.   ibuprofen (ADVIL,MOTRIN) 200 MG tablet, Take 200 mg by mouth every 6 (six) hours as needed.   Current Outpatient Medications (Other):    Ascorbic Acid (VITAMIN C) 1000 MG tablet, Take 1,000 mg by mouth daily.   beta carotene w/minerals (OCUVITE) tablet, Take 1 tablet  by mouth daily.   Calcium  Carbonate-Vitamin D  600-400 MG-UNIT tablet, Take 1 tablet by mouth daily.   co-enzyme Q-10 50 MG capsule, Take 150 mg by mouth daily.   TART CHERRY PO, Take by mouth.   Turmeric 500 MG TABS, Take by mouth.   VITAMIN D , CHOLECALCIFEROL,  PO, Take by mouth.   Reviewed prior external information including notes and imaging from  primary care provider As well as notes that were available from care everywhere and other healthcare systems.  Past medical history, social, surgical and family history all reviewed in electronic medical record.  No pertanent information unless stated regarding to the chief complaint.   Review of Systems:  No headache, visual changes, nausea, vomiting, diarrhea, constipation, dizziness, abdominal pain, skin rash, fevers, chills, night sweats, weight loss, swollen lymph nodes, body aches, joint swelling, chest pain, shortness of breath, mood changes. POSITIVE muscle aches  Objective  Blood pressure 120/84, pulse 83, height 5\' 4"  (1.626 m), weight 160 lb (72.6 kg), SpO2 91%.   General: No apparent distress alert and oriented x3 mood and affect normal, dressed appropriately.  HEENT: Pupils equal, extraocular movements intact  Respiratory: Patient's speak in full sentences and does not appear short of breath  Cardiovascular: No lower extremity edema, non tender, no erythema  Low back pain overall he does have some degenerative scoliosis but is nontender on exam today.  Patient is able to get up from a sitting position without any great difficulty.  Negative straight leg test noted today.  Lacks last 10 degrees of extension of the back   Left shoulder exam shows positive impingement.  Severely positive crossover noted.  Mild tenderness over the acromioclavicular joint  [address]65; 15 additional minutes spent for Therapeutic exercises as stated in above notes.  This included exercises focusing on stretching, strengthening, with significant focus on  eccentric aspects.   Long term goals include an improvement in range of motion, strength, endurance as well as avoiding reinjury. Patient's frequency would include in 1-2 times a day, 3-5 times a week for a duration of 6-12 weeks. Shoulder Exercises that included:  Basic scapular stabilization to include adduction and depression of scapula Scaption, focusing on proper movement and good control Internal and External rotation utilizing a theraband, with elbow tucked at side entire time Rows with theraband   Proper technique shown and discussed handout in great detail with ATC.  All questions were discussed and answered.     Impression and Recommendations:     The above documentation has been reviewed and is accurate and complete Shabre Kreher M Koy Lamp, DO

## 2023-07-06 ENCOUNTER — Ambulatory Visit: Admitting: Family Medicine

## 2023-07-06 ENCOUNTER — Encounter: Payer: Self-pay | Admitting: Family Medicine

## 2023-07-06 ENCOUNTER — Ambulatory Visit (INDEPENDENT_AMBULATORY_CARE_PROVIDER_SITE_OTHER)

## 2023-07-06 VITALS — BP 120/84 | HR 83 | Ht 64.0 in | Wt 160.0 lb

## 2023-07-06 DIAGNOSIS — M25512 Pain in left shoulder: Secondary | ICD-10-CM | POA: Insufficient documentation

## 2023-07-06 NOTE — Assessment & Plan Note (Signed)
 Discussed avoiding certain range of motion.  Discussed with patient about topical anti-inflammatories, icing regimen.  Work with for further care to learn home exercises.  Follow-up again in 6 to 8 weeks

## 2023-07-06 NOTE — Patient Instructions (Addendum)
 Okay L shoulder xray today Exercises Hug knees before you get up See me again in 3 months

## 2023-07-07 ENCOUNTER — Telehealth: Payer: Self-pay

## 2023-07-07 ENCOUNTER — Other Ambulatory Visit: Payer: Self-pay

## 2023-07-07 MED ORDER — TELMISARTAN-HCTZ 40-12.5 MG PO TABS
1.5000 | ORAL_TABLET | Freq: Every day | ORAL | 3 refills | Status: DC
Start: 2023-07-07 — End: 2023-07-20

## 2023-07-07 NOTE — Telephone Encounter (Signed)
 Copied from CRM [id number]. Topic: General - Other >> Jul 06, 2023 11:45 AM Howard Macho wrote: Reason for CRM: laurie from [facility] called stating the patient was told to take 1 1/2 of the telmisartan -hydrochlorothiazide  and laurie is calling to get an updated prescription because she does not have anything on file reflecting that CB [phone]

## 2023-07-07 NOTE — Telephone Encounter (Signed)
 Contacted pharmacy today. Rx with taking 1.5 tablet is sent. No further action is needed at this time.

## 2023-07-07 NOTE — Telephone Encounter (Signed)
 Duplicate

## 2023-07-07 NOTE — Telephone Encounter (Signed)
 Follow up with pharmacy and confirm with them pt is taking 1.5 tablet of the Rx. They advise to send in the Rx.   Rx is sent.

## 2023-07-07 NOTE — Telephone Encounter (Signed)
 Copied from CRM [id number]. Topic: Clinical - Prescription Issue >> Jul 02, 2023 10:11 AM Keitha Pata L wrote: Reason for CRM: [facility] is calling to verify the directions for medication telmisartan -hydrochlorothiazide  (MICARDIS  HCT) 40-12.5 MG tablet and needs a call back @ [phone]

## 2023-07-07 NOTE — Telephone Encounter (Signed)
 Copied from CRM [id number]. Topic: General - Other >> Jul 06, 2023 11:45 AM Howard Macho wrote: Reason for CRM: [facility]urie from [facility] called stating the patient was told to take 1 1/2 of the telmisartan -hydrochlorothiazide  and laurie is calling to get an updated prescription because she does not have anything on file reflecting that CB [phone] >> Jul 07, 2023  1:26 PM Felizardo Hotter wrote: Received call from Christiane Cowing with [facility] stating patient was told to take 1 1/2 of the telmisartan -hydrochlorothiazide  and laurie is calling to get an updated prescription because she does not have anything on file reflecting instruction Please call back at [phone].

## 2023-07-10 ENCOUNTER — Ambulatory Visit: Payer: Self-pay | Admitting: Family Medicine

## 2023-07-15 ENCOUNTER — Telehealth: Payer: Self-pay

## 2023-07-15 NOTE — Telephone Encounter (Unsigned)
 Copied from CRM [id number]. Topic: General - Other >> Jul 06, 2023 11:45 AM Howard Macho wrote: Reason for CRM: [facility] called stating the patient was told to take 1 1/2 of the telmisartan -hydrochlorothiazide  and laurie is calling to get an updated prescription because she does not have anything on file reflecting that CB [phone] >> Jul 15, 2023 10:11 AM Luane Rumps D wrote: Christiane Cowing with [facility] calling because the rx for telmisartan -hydrochlorothiazide  (MICARDIS  HCT) 40-12.5 MG tablet still does not reflect taking 1.5 tablets. >> Jul 07, 2023  1:26 PM Felizardo Hotter wrote: Received call from Christiane Cowing with [facility] stating patient was told to take 1 1/2 of the telmisartan -hydrochlorothiazide  and laurie is calling to get an updated prescription because she does not have anything on file reflecting instruction Please call back at [phone].

## 2023-07-20 ENCOUNTER — Other Ambulatory Visit: Payer: Self-pay

## 2023-07-20 MED ORDER — TELMISARTAN-HCTZ 40-12.5 MG PO TABS: 1.5000 | ORAL_TABLET | Freq: Every day | ORAL | 3 refills | Status: DC

## 2023-07-20 NOTE — Telephone Encounter (Signed)
 Rx sent.  Follow up with Christiane Cowing with Pharmacy. They states they received the order and currently need no further action.

## 2023-07-28 ENCOUNTER — Ambulatory Visit: Admitting: Family Medicine

## 2023-07-28 ENCOUNTER — Encounter: Payer: Self-pay | Admitting: Family Medicine

## 2023-07-28 VITALS — BP 134/78 | HR 82 | Temp 98.2°F | Ht 64.0 in | Wt 160.0 lb

## 2023-07-28 DIAGNOSIS — I10 Essential (primary) hypertension: Secondary | ICD-10-CM

## 2023-07-28 DIAGNOSIS — J069 Acute upper respiratory infection, unspecified: Secondary | ICD-10-CM

## 2023-07-28 MED ORDER — DOXYCYCLINE HYCLATE 100 MG PO CAPS: 100.0000 mg | ORAL_CAPSULE | Freq: Two times a day (BID) | ORAL | 0 refills | Status: AC

## 2023-07-28 NOTE — Progress Notes (Signed)
 Established Patient Office Visit  Subjective   Patient ID: Cheryl Robbins, female    DOB: 05-11-1945  Age: 78 y.o. MRN: [id number]  Chief Complaint  Patient presents with   Acute Visit    High bp sob    Cheryl Robbins 78 year old female presents with complaints of high blood pressure. Dr. Ethel Henry recently increased blood pressure medication dose Mcardis 1.5 tablets. She occasionally monitors her blood pressure at home. Patient brought readings to visit, along with her blood pressure machine. Some readings are elevated, but there are not times with readings. Patient states that the readings are more elevated in the evening or at night. Readings have ranged from 110-136/ 80-94. Denies headache, blurry vision, chest pain, palpitations, dizziness or lower extremity swelling.   URI- She has complaints of congestion, cough with yellow sputum, and chest tightness for 2 weeks. She has used 3 nebulizer treatment. Last treatment was Sunday at 5pm. She has dyspnea when going up steps and playing with her young grandchildren. Feels jittery after a nebulizer treatment. Denies feelings of anxiety.     Review of Systems  HENT:  Positive for congestion. Negative for sore throat.   Eyes:  Negative for blurred vision.  Respiratory:  Positive for cough.   Cardiovascular:  Negative for chest pain and palpitations.  Gastrointestinal:  Negative for heartburn, nausea and vomiting.  Neurological:  Negative for dizziness and headaches.      Objective:     Pulse 82   Temp 98.2 F (36.8 C) (Oral)   Ht 5\' 4"  (1.626 m)   Wt 160 lb (72.6 kg)   SpO2 96%   BMI 27.46 kg/m    Physical Exam Constitutional:      Appearance: Normal appearance.  HENT:     Head: Normocephalic and atraumatic.  Eyes:     Extraocular Movements: Extraocular movements intact.     Conjunctiva/sclera: Conjunctivae normal.  Cardiovascular:     Rate and Rhythm: Normal rate and regular rhythm.     Heart sounds: Normal heart  sounds.  Pulmonary:     Effort: Pulmonary effort is normal.     Breath sounds: Normal breath sounds.     Comments: Wet cough on exam Musculoskeletal:        General: Normal range of motion.     Cervical back: Normal range of motion.  Neurological:     Mental Status: She is alert.  Psychiatric:        Mood and Affect: Mood normal.        Behavior: Behavior normal.    The 10-year ASCVD risk score (Arnett DK, et al., 2019) is: 24.8%    Assessment & Plan:   Problem List Items Addressed This Visit       Cardiovascular and Mediastinum   Essential hypertension   Other Visit Diagnoses       Upper respiratory tract infection, unspecified type    -  Primary   Relevant Medications   doxycycline  (VIBRAMYCIN ) 100 MG capsule      Essential Hypertension Evaluated patient's blood pressure machine. Machine is close in accuracy with other readings from today's visit.  Recommend monitoring blood pressure 3 times a day for two weeks. Blood pressure log given to patient to bring back to next visit.  Recommend staying away from steroids, as this will increase blood pressure. Coricidin is okay to take for the URI and will not increase blood pressure levels.   Upper respiratory infection Doxycycline  100mg  prescription sent to preferred  pharmacy. Recommend taking entire antibiotic.  Return to clinic if there is no improvement in 1 week. Recommend taking OTC coricidin for symptom management.     Return in about 2 weeks (around 08/11/2023). Follow-up with Dr. Ethel Henry In 2 weeks.    Janeann Mean, RN

## 2023-07-28 NOTE — Patient Instructions (Signed)
 It was a pleasure seeing you today!  Essential Hypertension Blood pressure machine evaluated at today's visit. Machine is close in accuracy with other readings from today's visit.  Recommend monitoring blood pressure 3 times a day for two weeks. Blood pressure log given for to next visit.  Recommend staying away from steroids, as this will increase blood pressure. Coricidin is okay to take for the URI and will not increase blood pressure levels.   Upper respiratory infection Doxycycline  100mg  prescription sent to preferred pharmacy. Recommend taking entire antibiotic.  Return to clinic if there is no improvement in 1 week. Recommend taking OTC coricidin for symptom management.

## 2023-08-12 ENCOUNTER — Ambulatory Visit: Admitting: Internal Medicine

## 2023-08-12 ENCOUNTER — Encounter: Payer: Self-pay | Admitting: Internal Medicine

## 2023-08-12 VITALS — BP 128/84 | HR 76 | Temp 97.9°F | Ht 64.0 in | Wt 157.2 lb

## 2023-08-12 DIAGNOSIS — Z79899 Other long term (current) drug therapy: Secondary | ICD-10-CM | POA: Diagnosis not present

## 2023-08-12 DIAGNOSIS — I10 Essential (primary) hypertension: Secondary | ICD-10-CM | POA: Diagnosis not present

## 2023-08-12 LAB — BASIC METABOLIC PANEL WITH GFR
BUN: 13 mg/dL (ref 6–23)
CO2: 34 meq/L — ABNORMAL HIGH (ref 19–32)
Calcium: 9.2 mg/dL (ref 8.4–10.5)
Chloride: 99 meq/L (ref 96–112)
Creatinine, Ser: 0.82 mg/dL (ref 0.40–1.20)
GFR: 68.29 mL/min (ref >60.00)
Glucose, Bld: 110 mg/dL — ABNORMAL HIGH (ref 70–99)
Potassium: 4.3 meq/L (ref 3.5–5.1)
Sodium: 137 meq/L (ref 135–145)

## 2023-08-12 LAB — LIPID PANEL
Cholesterol: 152 mg/dL (ref 0–200)
HDL: 40.6 mg/dL (ref >39.00)
LDL Cholesterol: 90 mg/dL (ref 0–99)
NonHDL: 111.48
Total CHOL/HDL Ratio: 4
Triglycerides: 108 mg/dL (ref 0.0–149.0)
VLDL: 21.6 mg/dL (ref 0.0–40.0)

## 2023-08-12 MED ORDER — TELMISARTAN-HCTZ 40-12.5 MG PO TABS: 1.5000 | ORAL_TABLET | Freq: Every day | ORAL | 3 refills | Status: DC

## 2023-08-12 NOTE — Progress Notes (Signed)
 Chief Complaint  Patient presents with   Medical Management of Chronic Issues    3 months follow up on BP. Pt reports she has not taken BP med this morning. BP at home was 118/85.     HPI: Cheryl Robbins 78 y.o. come in for Chronic disease management   Fu bp   management  3 mos   increased lemisart hydrochlorothiazide   to 1.5 per day  In interim under eval care bu allergust for ar  And had eval x ray for l shoulder per dr claudene  ROS: See pertinent positives and negatives per HPI.  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   Primiparous    Rosacea     Family History  Problem Relation Age of Onset   Atrial fibrillation Mother        had cv in her 66 s   Arthritis Mother        had staph infection knee replacment   Cancer Sister        thryoid   Fibromyalgia Brother    Colon cancer Neg Hx    Rectal cancer Neg Hx    Stomach cancer Neg Hx     Social History   Socioeconomic History   Marital status: Unknown    Spouse name: Not on file   Number of children: Not on file   Years of education: Not on file   Highest education level: Master's degree (e.g., MA, MS, MEng, MEd, MSW, MBA)  Occupational History   Occupation: Retired  Tobacco Use   Smoking status: Never   Smokeless tobacco: Never  Vaping Use   Vaping status: Never Used  Substance and Sexual Activity   Alcohol use: No   Drug use: No   Sexual activity: Not on file  Other Topics Concern   Not on file  Social History Narrative   hh of 1   No pets   No ets.   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      Mom age 76 friends home snif recently af TKR infection passed away 2015-08-29    Social Drivers of Health   Financial  Resource Strain: Low Risk  (04/22/2023)   Overall Financial Resource Strain (CARDIA)    Difficulty of Paying Living Expenses: Not hard at all  Food Insecurity: No Food Insecurity (04/22/2023)   Hunger Vital Sign    Worried About Running Out of Food in the Last Year: Never true    Ran Out of Food in the Last Year: Never true  Transportation Needs: No Transportation Needs (04/22/2023)   PRAPARE - Administrator, Civil Service (Medical): No    Lack of Transportation (Non-Medical): No  Physical Activity: Insufficiently Active (04/22/2023)   Exercise Vital Sign    Days of Exercise per Week: 4 days    Minutes of Exercise per Session: 30 min  Stress: No Stress Concern Present (04/22/2023)   [employer] of Occupational Health - Occupational Stress Questionnaire    Feeling of Stress : Not at all  Social Connections: Moderately Isolated (04/22/2023)   Social Connection and Isolation Panel  Frequency of Communication with Friends and Family: More than three times a week    Frequency of Social Gatherings with Friends and Family: More than three times a week    Attends Religious Services: More than 4 times per year    Active Member of [employer] or Organizations: No    Attends Banker Meetings: Not on file    Marital Status: Widowed    Outpatient Medications Prior to Visit  Medication Sig Dispense Refill   acetaminophen (TYLENOL) 500 MG tablet Take 1,000 mg by mouth every 6 (six) hours as needed.     Ascorbic Acid (VITAMIN C) 1000 MG tablet Take 1,000 mg by mouth daily.     atorvastatin  (LIPITOR) 10 MG tablet TAKE 1 TABLET BY MOUTH EVERY DAY 90 tablet 3   beta carotene w/minerals (OCUVITE) tablet Take 1 tablet by mouth daily.     Calcium  Carbonate-Vitamin D  600-400 MG-UNIT tablet Take 1 tablet by mouth daily.     co-enzyme Q-10 50 MG capsule Take 150 mg by mouth daily.     EPINEPHrine 0.3 mg/0.3 mL IJ SOAJ injection USE AS DIRECTED AS NEEDED     fluticasone (FLONASE) 50 MCG/ACT  nasal spray Place into both nostrils daily.     guaiFENesin (MUCINEX) 600 MG 12 hr tablet Take by mouth 2 (two) times daily.     ibuprofen (ADVIL,MOTRIN) 200 MG tablet Take 200 mg by mouth every 6 (six) hours as needed.     levothyroxine  (SYNTHROID ) 25 MCG tablet TAKE 1 TABLET BY MOUTH EVERY DAY BEFORE BREAKFAST 90 tablet 3   loratadine (CLARITIN) 10 MG tablet Take 10 mg by mouth as needed.     TART CHERRY PO Take by mouth.     Turmeric 500 MG TABS Take by mouth.     VITAMIN D , CHOLECALCIFEROL, PO Take by mouth.     telmisartan -hydrochlorothiazide  (MICARDIS  HCT) 40-12.5 MG tablet Take 1.5 tablets by mouth daily. Dosage change 135 tablet 3   No facility-administered medications prior to visit.     EXAM:  BP 128/84   Pulse 76   Temp 97.9 F (36.6 C) (Oral)   Ht 5' 4 (1.626 m)   Wt 157 lb 3.2 oz (71.3 kg)   SpO2 97%   BMI 26.98 kg/m   Body mass index is 26.98 kg/m. Wt Readings from Last 3 Encounters:  08/12/23 157 lb 3.2 oz (71.3 kg)  07/28/23 160 lb (72.6 kg)  07/06/23 160 lb (72.6 kg)  Bp readings  office  left and her monitor 128/84  hers 134/90  Bp log are mostly in range goal and down ot 1 teens range  and pulse in 80 = reange   GENERAL: vitals reviewed and listed above, alert, oriented, appears well hydrated and in no acute distress HEENT: atraumatic, conjunctiva  clear, no obvious abnormalities on inspection of external nose and ears LUNGS: clear to auscultation bilaterally, no wheezes, rales or rhonchi, good air movement CV: HRRR, no clubbing cyanosis or  peripheral edema nl cap refill  MS: moves all extremities without noticeable focal  abnormality PSYCH: pleasant and cooperative, no obvious depression or anxiety Lab Results  Component Value Date   WBC 6.4 04/22/2023   HGB 15.1 (H) 04/22/2023   HCT 45.0 04/22/2023   PLT 186.0 04/22/2023   GLUCOSE 100 (H) 04/22/2023   CHOL 149 04/22/2023   TRIG 185.0 (H) 04/22/2023   HDL 44.70 04/22/2023   LDLDIRECT 171.0  06/17/2006   LDLCALC 67 04/22/2023   ALT  21 04/22/2023   AST 17 04/22/2023   NA 138 04/22/2023   K 3.9 04/22/2023   CL 99 04/22/2023   CREATININE 0.87 04/22/2023   BUN 19 04/22/2023   CO2 31 04/22/2023   TSH 2.04 04/22/2023   HGBA1C 5.4 04/22/2023   BP Readings from Last 3 Encounters:  08/12/23 128/84  07/28/23 134/78  07/06/23 120/84   Was not fasting last check  ASSESSMENT AND PLAN:  Discussed the following assessment and plan:  Essential hypertension - elevated in office  controlled out f office personal monitor correlates  enought ot make managment decisions - Plan: Basic metabolic panel with GFR, Lipid panel  Medication management - Plan: Basic metabolic panel with GFR, Lipid panel Update fasting lab today and if ok then yearly check ( next march 26 ) Tg were up and was not fasting  Reviewed plan and fu continue BP control  -Patient advised to return or notify health care team  if  new concerns arise.  Patient Instructions  Bp monitor is  good enough for  management  Controlled at home  and up in office  correlates.   Stay on same dosing.  Update lab today and if all ok then  .see you in March of next year .         Mckyle Solanki K. Melchizedek Espinola M.D.

## 2023-08-12 NOTE — Patient Instructions (Addendum)
 Bp monitor is  good enough for  management  Controlled at home  and up in office  correlates.   Stay on same dosing.  Update lab today and if all ok then  .see you in March of next year .

## 2023-08-30 ENCOUNTER — Ambulatory Visit: Payer: Self-pay | Admitting: Internal Medicine

## 2023-08-30 NOTE — Progress Notes (Signed)
 Chemistry ok except blood sugar but ok for non fasting  , cholesterol ldl acceptable  ( below 100, but not as good als last year  17 .  optimize Continue lifestyle intervention healthy eating and exercise .  And yearly check as planned

## 2023-09-29 NOTE — Progress Notes (Deleted)
 [facility] [address] [place] [address] Phone: [phone] Subjective:    I'm seeing this patient by the request  of:  Panosh, Apolinar POUR, MD  CC:   YEP:Dlagzrupcz  07/06/2023 Discussed avoiding certain range of motion.  Discussed with patient about topical anti-inflammatories, icing regimen.  Work with for further care to learn home exercises.  Follow-up again in 6 to 8 weeks      Update 10/06/2023 Cheryl Robbins is a 78 y.o. female coming in with complaint of L AC joint pain. 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   CATARACT EXTRACTION     Both eyes   COLONOSCOPY     EYE SURGERY     OOPHORECTOMY     tonsilletomy  1953   Social History   Socioeconomic History   Marital status: Unknown    Spouse name: Not on file   Number of children: Not on file   Years of education: Not on file   Highest education level: Master's degree (e.g., MA, MS, MEng, MEd, MSW, MBA)  Occupational History   Occupation: Retired  Tobacco Use   Smoking status: Never   Smokeless tobacco: Never  Vaping Use   Vaping status: Never Used  Substance and Sexual Activity   Alcohol use: No   Drug use: No   Sexual activity: Not on file  Other Topics Concern   Not on file  Social History Narrative   hh of 1   No pets   No ets.   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      Mom age 75 friends home snif recently af TKR infection passed away 10-18-2015    Social Drivers of  Health   Financial Resource Strain: Low Risk  (04/22/2023)   Overall Financial Resource Strain (CARDIA)    Difficulty of Paying Living Expenses: Not hard at all  Food Insecurity: No Food Insecurity (04/22/2023)   Hunger Vital Sign    Worried About Running Out of Food in the Last Year: Never true    Ran Out of Food in the Last Year: Never true  Transportation Needs: No Transportation Needs (04/22/2023)   PRAPARE - Administrator, Civil Service (Medical): No    Lack of Transportation (Non-Medical): No  Physical Activity: Insufficiently Active (04/22/2023)   Exercise Vital Sign    Days of Exercise per Week: 4 days    Minutes of Exercise per Session: 30 min  Stress: No Stress Concern Present (04/22/2023)   [employer] of Occupational Health - Occupational Stress Questionnaire    Feeling of Stress : Not at all  Social Connections: Moderately Isolated (04/22/2023)   Social Connection and Isolation Panel    Frequency of Communication with Friends and Family: More than three times a week    Frequency of Social Gatherings  with Friends and Family: More than three times a week    Attends Religious Services: More than 4 times per year    Active Member of Clubs or Organizations: No    Attends Banker Meetings: Not on file    Marital Status: Widowed   Allergies  Allergen Reactions   Penicillins     Gums swelled up and turned blue   Ace Inhibitors Cough    Went away when med changed    Clindamycin/Lincomycin     Had rash with diflucan  and clind but cant tell which one it was  As per dr Floy   Sulfonamide Derivatives    Diflucan  [Fluconazole ] Rash   Simvastatin  Other (See Comments)    Body leg aches  On simva and doxy better off see July 17 note   Family History  Problem Relation Age of Onset   Atrial fibrillation Mother        had cv in her 66 s   Arthritis Mother        had staph infection knee replacment   Cancer Sister        thryoid   Fibromyalgia Brother     Colon cancer Neg Hx    Rectal cancer Neg Hx    Stomach cancer Neg Hx     Current Outpatient Medications (Endocrine & Metabolic):    levothyroxine  (SYNTHROID ) 25 MCG tablet, TAKE 1 TABLET BY MOUTH EVERY DAY BEFORE BREAKFAST  Current Outpatient Medications (Cardiovascular):    atorvastatin  (LIPITOR) 10 MG tablet, TAKE 1 TABLET BY MOUTH EVERY DAY   EPINEPHrine 0.3 mg/0.3 mL IJ SOAJ injection, USE AS DIRECTED AS NEEDED   telmisartan -hydrochlorothiazide  (MICARDIS  HCT) 40-12.5 MG tablet, Take 1.5 tablets by mouth daily. Dosage change  Current Outpatient Medications (Respiratory):    fluticasone (FLONASE) 50 MCG/ACT nasal spray, Place into both nostrils daily.   guaiFENesin (MUCINEX) 600 MG 12 hr tablet, Take by mouth 2 (two) times daily.   loratadine (CLARITIN) 10 MG tablet, Take 10 mg by mouth as needed.  Current Outpatient Medications (Analgesics):    acetaminophen (TYLENOL) 500 MG tablet, Take 1,000 mg by mouth every 6 (six) hours as needed.   ibuprofen (ADVIL,MOTRIN) 200 MG tablet, Take 200 mg by mouth every 6 (six) hours as needed.   Current Outpatient Medications (Other):    Ascorbic Acid (VITAMIN C) 1000 MG tablet, Take 1,000 mg by mouth daily.   beta carotene w/minerals (OCUVITE) tablet, Take 1 tablet by mouth daily.   Calcium  Carbonate-Vitamin D  600-400 MG-UNIT tablet, Take 1 tablet by mouth daily.   co-enzyme Q-10 50 MG capsule, Take 150 mg by mouth daily.   TART CHERRY PO, Take by mouth.   Turmeric 500 MG TABS, Take by mouth.   VITAMIN D , CHOLECALCIFEROL, PO, Take by mouth.   Reviewed prior external information including notes and imaging from  primary care provider As well as notes that were available from care everywhere and other healthcare systems.  Past medical history, social, surgical and family history all reviewed in electronic medical record.  No pertanent information unless stated regarding to the chief complaint.   Review of Systems:  No headache, visual  changes, nausea, vomiting, diarrhea, constipation, dizziness, abdominal pain, skin rash, fevers, chills, night sweats, weight loss, swollen lymph nodes, body aches, joint swelling, chest pain, shortness of breath, mood changes. POSITIVE muscle aches  Objective  There were no vitals taken for this visit.   General: No apparent distress alert and oriented x3 mood and affect  normal, dressed appropriately.  HEENT: Pupils equal, extraocular movements intact  Respiratory: Patient's speak in full sentences and does not appear short of breath  Cardiovascular: No lower extremity edema, non tender, no erythema      Impression and Recommendations:

## 2023-10-06 ENCOUNTER — Ambulatory Visit: Admitting: Family Medicine

## 2023-11-06 ENCOUNTER — Telehealth: Payer: Self-pay

## 2023-11-06 MED ORDER — TELMISARTAN-HCTZ 40-12.5 MG PO TABS: 1.5000 | ORAL_TABLET | Freq: Every day | ORAL | 0 refills | Status: DC

## 2023-11-06 NOTE — Telephone Encounter (Signed)
 Copied from CRM [id number]. Topic: Clinical - Prescription Issue >> Nov 06, 2023  9:44 AM Charolett L wrote: Reason for CRM: telmisartan -hydrochlorothiazide  (MICARDIS  HCT) 40-12.5 MG tablet  Patient is out of town and left her medication list above on the kitchen counter. Patient needs 5 pills sent to pharmacy listed below which would cover her trip.   [facility] Address: [address] [place], [place] [address] Phone: [phone]

## 2023-11-10 NOTE — Telephone Encounter (Signed)
 Rx was sent on 9/19

## 2023-11-19 ENCOUNTER — Ambulatory Visit: Admitting: Family Medicine

## 2023-12-10 ENCOUNTER — Ambulatory Visit: Admitting: Family Medicine

## 2023-12-21 ENCOUNTER — Ambulatory Visit: Admitting: Family Medicine

## 2024-01-06 NOTE — Progress Notes (Unsigned)
 [facility] [address] [place] [address] Phone: [phone] Subjective:   Cheryl Robbins am a scribe for Dr. Claudene.   I'm seeing this patient by the request  of:  Panosh, Apolinar POUR, MD  CC: Left shoulder pain  YEP:Dlagzrupcz  07/06/2023 Discussed avoiding certain range of motion.  Discussed with patient about topical anti-inflammatories, icing regimen.  Work with for further care to learn home exercises.  Follow-up again in 6 to 8 weeks     Updated 01/07/2024 Cheryl Robbins is a 78 y.o. female coming in with complaint of L shoulder pain, was seen back in May 2025.  Was to follow-up after 3 months.  Patient did have x-rays of the shoulder done at that time that did not show any significant bony abnormality. Patient states that the left shoulder is good. No flare ups of pain this week.        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   Primiparous    Rosacea    Past Surgical History:  Procedure Laterality Date   atypical moles     bilateral salpingoopherectomy     for r 4cm clear r adnexal mass 08-Feb-2003 serous cystadenoma   CATARACT EXTRACTION     Both eyes   COLONOSCOPY     EYE SURGERY     OOPHORECTOMY     tonsilletomy  1953   Social History   Socioeconomic History   Marital status: Unknown    Spouse name: Not on file   Number of children: Not on file   Years of education: Not on file   Highest education level: Master's degree (e.g., MA, MS, MEng, MEd, MSW, MBA)  Occupational History   Occupation: Retired  Tobacco Use   Smoking status: Never   Smokeless tobacco: Never  Vaping Use   Vaping status: Never Used  Substance and Sexual Activity   Alcohol use: No   Drug use: No   Sexual activity: Not on file   Other Topics Concern   Not on file  Social History Narrative   hh of 1   No pets   No ets.   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      Mom age 2 friends home snif recently af TKR infection passed away 02-08-2016    Social Drivers of Health   Financial Resource Strain: Low Risk  (04/22/2023)   Overall Financial Resource Strain (CARDIA)    Difficulty of Paying Living Expenses: Not hard at all  Food Insecurity: No Food Insecurity (04/22/2023)   Hunger Vital Sign    Worried About Running Out of Food in the Last Year: Never true    Ran Out of Food in the Last Year: Never true  Transportation Needs: No Transportation Needs (04/22/2023)   PRAPARE - Administrator, Civil Service (Medical): No    Lack of Transportation (Non-Medical): No  Physical Activity: Insufficiently Active (04/22/2023)   Exercise Vital Sign    Days of Exercise per Week: 4 days    Minutes of Exercise per Session: 30 min  Stress: No Stress Concern Present (  04/22/2023)   [employer] - Occupational Stress Questionnaire    Feeling of Stress : Not at all  Social Connections: Moderately Isolated (04/22/2023)   Social Connection and Isolation Panel    Frequency of Communication with Friends and Family: More than three times a week    Frequency of Social Gatherings with Friends and Family: More than three times a week    Attends Religious Services: More than 4 times per year    Active Member of [employer] or Organizations: No    Attends Banker Meetings: Not on file    Marital Status: Widowed   Allergies  Allergen Reactions   Penicillins     Gums swelled up and turned blue   Ace Inhibitors Cough    Went away when med changed    Clindamycin/Lincomycin     Had rash with diflucan  and clind but cant tell which one it was  As per dr Floy   Sulfonamide Derivatives    Diflucan  [Fluconazole ] Rash   Simvastatin  Other  (See Comments)    Body leg aches  On simva and doxy better off see July 17 note   Family History  Problem Relation Age of Onset   Atrial fibrillation Mother        had cv in her 33 s   Arthritis Mother        had staph infection knee replacment   Cancer Sister        thryoid   Fibromyalgia Brother    Colon cancer Neg Hx    Rectal cancer Neg Hx    Stomach cancer Neg Hx     Current Outpatient Medications (Endocrine & Metabolic):    levothyroxine  (SYNTHROID ) 25 MCG tablet, TAKE 1 TABLET BY MOUTH EVERY DAY BEFORE BREAKFAST  Current Outpatient Medications (Cardiovascular):    atorvastatin  (LIPITOR) 10 MG tablet, TAKE 1 TABLET BY MOUTH EVERY DAY   EPINEPHrine 0.3 mg/0.3 mL IJ SOAJ injection, USE AS DIRECTED AS NEEDED   telmisartan -hydrochlorothiazide  (MICARDIS  HCT) 40-12.5 MG tablet, Take 1.5 tablets by mouth daily. Dosage change  Current Outpatient Medications (Respiratory):    fluticasone (FLONASE) 50 MCG/ACT nasal spray, Place into both nostrils daily.   guaiFENesin (MUCINEX) 600 MG 12 hr tablet, Take by mouth 2 (two) times daily.   loratadine (CLARITIN) 10 MG tablet, Take 10 mg by mouth as needed.  Current Outpatient Medications (Analgesics):    acetaminophen (TYLENOL) 500 MG tablet, Take 1,000 mg by mouth every 6 (six) hours as needed.   ibuprofen (ADVIL,MOTRIN) 200 MG tablet, Take 200 mg by mouth every 6 (six) hours as needed.   Current Outpatient Medications (Other):    Ascorbic Acid (VITAMIN C) 1000 MG tablet, Take 1,000 mg by mouth daily.   beta carotene w/minerals (OCUVITE) tablet, Take 1 tablet by mouth daily.   Calcium  Carbonate-Vitamin D  600-400 MG-UNIT tablet, Take 1 tablet by mouth daily.   co-enzyme Q-10 50 MG capsule, Take 150 mg by mouth daily.   TART CHERRY PO, Take by mouth.   Turmeric 500 MG TABS, Take by mouth.   VITAMIN D , CHOLECALCIFEROL, PO, Take by mouth.   Reviewed prior external information including notes and imaging from  primary care provider As  well as notes that were available from care everywhere and other healthcare systems.  Past medical history, social, surgical and family history all reviewed in electronic medical record.  No pertanent information unless stated regarding to the chief complaint.   Review of Systems:  No headache,  visual changes, nausea, vomiting, diarrhea, constipation, dizziness, abdominal pain, skin rash, fevers, chills, night sweats, weight loss, swollen lymph nodes, body aches, joint swelling, chest pain, shortness of breath, mood changes. POSITIVE muscle aches  Objective  Blood pressure 130/70, pulse 82, height 5' 4 (1.626 m), weight 161 lb 12.8 oz (73.4 kg), SpO2 98%.   General: No apparent distress alert and oriented x3 mood and affect normal, dressed appropriately.  HEENT: Pupils equal, extraocular movements intact  Respiratory: Patient's speak in full sentences and does not appear short of breath  Cardiovascular: No lower extremity edema, non tender, no erythema  Left shoulder exam shows patient does have significant improvement in range of motion noted. Left elbow does have a fullness noted.  Seems to be more of a varicose vein noted.  Fullness noted in the antecubital fossa but no true mass appreciated.   Impression and Recommendations:    The above documentation has been reviewed and is accurate and complete Rupinder Livingston M Derrick Orris, DO

## 2024-01-07 ENCOUNTER — Ambulatory Visit (INDEPENDENT_AMBULATORY_CARE_PROVIDER_SITE_OTHER)

## 2024-01-07 ENCOUNTER — Other Ambulatory Visit: Payer: Self-pay

## 2024-01-07 ENCOUNTER — Ambulatory Visit: Admitting: Family Medicine

## 2024-01-07 VITALS — BP 130/70 | HR 82 | Ht 64.0 in | Wt 161.8 lb

## 2024-01-07 DIAGNOSIS — M25512 Pain in left shoulder: Secondary | ICD-10-CM

## 2024-01-07 DIAGNOSIS — M25522 Pain in left elbow: Secondary | ICD-10-CM

## 2024-01-07 NOTE — Assessment & Plan Note (Signed)
 Has swelling in the antecubital fossa, seems to be though more of a varicose vein noted.  No true mass appreciated.  X-ray ordered today.  If worsening pain advanced imaging but think it is highly unlikely.

## 2024-01-07 NOTE — Assessment & Plan Note (Signed)
 Doing fantastic with conservative therapy.  Hold on any type of injection at the moment.  Discussed icing regimen.  Patient is doing well and will follow-up again in 3 months if needed.

## 2024-01-07 NOTE — Patient Instructions (Addendum)
 Good to see you. Metatarsal pad for your sister HOKA recovery sandals Xray L elbow Looks like varicose vein See me again in 6 months

## 2024-01-13 ENCOUNTER — Ambulatory Visit: Payer: Self-pay | Admitting: Family Medicine

## 2024-02-04 ENCOUNTER — Ambulatory Visit

## 2024-02-04 VITALS — Ht 64.0 in | Wt 153.0 lb

## 2024-02-04 DIAGNOSIS — Z0000 Encounter for general adult medical examination without abnormal findings: Secondary | ICD-10-CM

## 2024-02-04 NOTE — Progress Notes (Cosign Needed)
 Chief Complaint  Patient presents with   Medicare Wellness     Subjective:   Cheryl Robbins is a 78 y.o. female who presents for a Medicare Annual Wellness Visit.  Visit info / Clinical Intake: Medicare Wellness Visit Type:: Subsequent Annual Wellness Visit Persons participating in visit and providing information:: patient Medicare Wellness Visit Mode:: Video Since this visit was completed virtually, some vitals may be partially provided or unavailable. Missing vitals are due to the limitations of the virtual format.: Documented vitals are patient reported If Telephone or Video please confirm:: I connected with patient using audio/video enable telemedicine. I verified patient identity with two identifiers, discussed telehealth limitations, and patient agreed to proceed. Patient Location:: home Provider Location:: home office Interpreter Needed?: No Pre-visit prep was completed: yes AWV questionnaire completed by patient prior to visit?: yes Date:: 02/04/24 Living arrangements:: (!) (Patient-Rptd) lives alone Patient's Overall Health Status Rating: (Patient-Rptd) very good Typical amount of pain: (Patient-Rptd) none Does pain affect daily life?: (Patient-Rptd) no Are you currently prescribed opioids?: no  Dietary Habits and Nutritional Risks How many meals a day?: (Patient-Rptd) 3 Eats fruit and vegetables daily?: (!) (Patient-Rptd) no Most meals are obtained by: (Patient-Rptd) preparing own meals; eating out In the last 2 weeks, have you had any of the following?: none Diabetic:: no  Functional Status Activities of Daily Living (to include ambulation/medication): (Patient-Rptd) Independent Ambulation: (Patient-Rptd) Independent Medication Administration: (Patient-Rptd) Independent Home Management (perform basic housework or laundry): (Patient-Rptd) Independent Manage your own finances?: (Patient-Rptd) yes Primary transportation is: (Patient-Rptd) driving Concerns about  vision?: no *vision screening is required for WTM* Concerns about hearing?: no  Fall Screening Falls in the past year?: (Patient-Rptd) 0 Number of falls in past year: 0 Was there an injury with Fall?: 0 Fall Risk Category Calculator: 0 Patient Fall Risk Level: Low Fall Risk  Fall Risk Patient at Risk for Falls Due to: Medication side effect Fall risk Follow up: Falls prevention discussed; Falls evaluation completed  Home and Transportation Safety: All rugs have non-skid backing?: (Patient-Rptd) yes All stairs or steps have railings?: (Patient-Rptd) yes Grab bars in the bathtub or shower?: (!) (Patient-Rptd) no Have non-skid surface in bathtub or shower?: (Patient-Rptd) yes Good home lighting?: (Patient-Rptd) yes Regular seat belt use?: (Patient-Rptd) yes Hospital stays in the last year:: (Patient-Rptd) no  Cognitive Assessment Difficulty concentrating, remembering, or making decisions? : (Patient-Rptd) no Will 6CIT or Mini Cog be Completed: yes What year is it?: 0 points What month is it?: 0 points Give patient an address phrase to remember (5 components): [address] About what time is it?: 0 points Count backwards from 20 to 1: 0 points Say the months of the year in reverse: 0 points Repeat the address phrase from earlier: 0 points 6 CIT Score: 0 points  Advance Directives (For Healthcare) Does Patient Have a Medical Advance Directive?: Yes Type of Advance Directive: Healthcare Power of [place]; Living will Copy of Healthcare Power of Attorney in Chart?: No - copy requested Copy of Living Will in Chart?: No - copy requested  Reviewed/Updated  Reviewed/Updated: Reviewed All (Medical, Surgical, Family, Medications, Allergies, Care Teams, Patient Goals)    Allergies (verified) Penicillins, Ace inhibitors, Clindamycin/lincomycin, Sulfonamide derivatives, Diflucan  [fluconazole ], and Simvastatin    Current Medications (verified) Outpatient Encounter Medications  as of 02/04/2024  Medication Sig   acetaminophen (TYLENOL) 500 MG tablet Take 1,000 mg by mouth every 6 (six) hours as needed.   Ascorbic Acid (VITAMIN C) 1000 MG tablet Take 1,000  mg by mouth daily.   atorvastatin  (LIPITOR) 10 MG tablet TAKE 1 TABLET BY MOUTH EVERY DAY   beta carotene w/minerals (OCUVITE) tablet Take 1 tablet by mouth daily.   Calcium  Carbonate-Vitamin D  600-400 MG-UNIT tablet Take 1 tablet by mouth daily.   co-enzyme Q-10 50 MG capsule Take 150 mg by mouth daily.   EPINEPHrine 0.3 mg/0.3 mL IJ SOAJ injection USE AS DIRECTED AS NEEDED   fluticasone (FLONASE) 50 MCG/ACT nasal spray Place into both nostrils daily.   guaiFENesin (MUCINEX) 600 MG 12 hr tablet Take by mouth 2 (two) times daily.   ibuprofen (ADVIL,MOTRIN) 200 MG tablet Take 200 mg by mouth every 6 (six) hours as needed.   levothyroxine  (SYNTHROID ) 25 MCG tablet TAKE 1 TABLET BY MOUTH EVERY DAY BEFORE BREAKFAST   loratadine (CLARITIN) 10 MG tablet Take 10 mg by mouth as needed.   TART CHERRY PO Take by mouth.   telmisartan -hydrochlorothiazide  (MICARDIS  HCT) 40-12.5 MG tablet Take 1.5 tablets by mouth daily. Dosage change   Turmeric 500 MG TABS Take by mouth.   VITAMIN D , CHOLECALCIFEROL, PO Take by mouth.   No facility-administered encounter medications on file as of 02/04/2024.    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   CATARACT EXTRACTION     Both eyes   COLONOSCOPY     EYE SURGERY     OOPHORECTOMY     tonsilletomy  1953   Family History  Problem Relation Age of Onset   Atrial  fibrillation Mother        had cv in her 55 s   Arthritis Mother        had staph infection knee replacment   Cancer Sister        thryoid   Fibromyalgia Brother    Colon cancer Neg Hx    Rectal cancer Neg Hx    Stomach cancer Neg Hx    Social History   Occupational History   Occupation: Retired  Tobacco Use   Smoking status: Never   Smokeless tobacco: Never  Vaping Use   Vaping status: Never Used  Substance and Sexual Activity   Alcohol use: No   Drug use: No   Sexual activity: Not on file   Tobacco Counseling Counseling given: Not Answered  SDOH Screenings   Food Insecurity: No Food Insecurity (02/04/2024)  Housing: Low Risk (02/04/2024)  Transportation Needs: No Transportation Needs (02/04/2024)  Utilities: Not At Risk (02/04/2024)  Alcohol Screen: Low Risk (02/04/2024)  Depression (PHQ2-9): Low Risk (02/04/2024)  Financial Resource Strain: Low Risk (02/04/2024)  Physical Activity: Insufficiently Active (02/04/2024)  Social Connections: Moderately Isolated (02/04/2024)  Stress: No Stress Concern Present (02/04/2024)  Tobacco Use: Low Risk (02/04/2024)  Health Literacy: Adequate Health Literacy (02/04/2024)   See flowsheets for full screening details  Depression Screen PHQ 2 & 9 Depression Scale- Over the past 2 weeks, how often have you been bothered by any of the following problems? Little interest or pleasure in doing  things: 0 Feeling down, depressed, or hopeless (PHQ Adolescent also includes...irritable): 0 PHQ-2 Total Score: 0 Trouble falling or staying asleep, or sleeping too much: 0 Feeling tired or having little energy: 0 Poor appetite or overeating (PHQ Adolescent also includes...weight loss): 0 Feeling bad about yourself - or that you are a failure or have let yourself or your family down: 0 Trouble concentrating on things, such as reading the newspaper or watching television (PHQ Adolescent also includes...like school work): 0 Moving or speaking so  slowly that other people could have noticed. Or the opposite - being so fidgety or restless that you have been moving around a lot more than usual: 0 Thoughts that you would be better off dead, or of hurting yourself in some way: 0 PHQ-9 Total Score: 0 If you checked off any problems, how difficult have these problems made it for you to do your work, take care of things at home, or get along with other people?: Not difficult at all  Depression Treatment Depression Interventions/Treatment : EYV7-0 Score <4 Follow-up Not Indicated     Goals Addressed             This Visit's Progress    Patient Stated       02/04/2024, keep on keeping on             Objective:    Today's Vitals   02/04/24 1052  Weight: 153 lb (69.4 kg)  Height: 5' 4 (1.626 m)   Body mass index is 26.26 kg/m.  Hearing/Vision screen Hearing Screening - Comments:: Denies hearing issues Vision Screening - Comments:: Regular eye exams, Dr. Leslee Immunizations and Health Maintenance Health Maintenance  Topic Date Due   COVID-19 Vaccine (10 - Pfizer risk 2025-26 season) 06/10/2024   Medicare Annual Wellness (AWV)  02/03/2025   DTaP/Tdap/Td (3 - Td or Tdap) 08/21/2025   Pneumococcal Vaccine: 50+ Years  Completed   Influenza Vaccine  Completed   Bone Density Scan  Completed   Hepatitis C Screening  Completed   Zoster Vaccines- Shingrix  Completed   Meningococcal B Vaccine  Aged Out   Mammogram  Discontinued   Colonoscopy  Discontinued        Assessment/Plan:  This is a routine wellness examination for Cheryl Robbins.  Patient Care Team: Panosh, Apolinar POUR, MD as PCP - General Shona Rush, MD (Dermatology) Leslee Reusing, MD as Consulting Physician (Ophthalmology) Fleeta Smock, Lamar BROCKS, MD as Consulting Physician (Allergy and Immunology) Claudene Arthea HERO, DO as Consulting Physician (Family Medicine)  I have personally reviewed and noted the following in the patients chart:   Medical and social  history Use of alcohol, tobacco or illicit drugs  Current medications and supplements including opioid prescriptions. Functional ability and status Nutritional status Physical activity Advanced directives List of other physicians Hospitalizations, surgeries, and ER visits in previous 12 months Vitals Screenings to include cognitive, depression, and falls Referrals and appointments  No orders of the defined types were placed in this encounter.  In addition, I have reviewed and discussed with patient certain preventive protocols, quality metrics, and best practice recommendations. A written personalized care plan for preventive services as well as general preventive health recommendations were provided to patient.   Ardella FORBES Dawn, LPN   [id number]/[id number]   Return in 1 year (on 02/03/2025).  After Visit Summary: (MyChart) Due to this being a telephonic visit, the after visit summary with patients personalized plan was offered to patient via MyChart   Nurse Notes: No voiced or  noted concerns at this time

## 2024-02-04 NOTE — Patient Instructions (Signed)
 Ms. Weissman,  Thank you for taking the time for your Medicare Wellness Visit. I appreciate your continued commitment to your health goals. Please review the care plan we discussed, and feel free to reach out if I can assist you further.  Please note that Annual Wellness Visits do not include a physical exam. Some assessments may be limited, especially if the visit was conducted virtually. If needed, we may recommend an in-person follow-up with your provider.  Ongoing Care Seeing your primary care provider every 3 to 6 months helps us  monitor your health and provide consistent, personalized care.   Referrals If a referral was made during today's visit and you haven't received any updates within two weeks, please contact the referred provider directly to check on the status.  Recommended Screenings:  Health Maintenance  Topic Date Due   Flu Shot  09/18/2023   Medicare Annual Wellness Visit  11/11/2023   COVID-19 Vaccine (10 - Pfizer risk 2025-26 season) 06/10/2024   DTaP/Tdap/Td vaccine (3 - Td or Tdap) 08/21/2025   Pneumococcal Vaccine for age over 20  Completed   Osteoporosis screening with Bone Density Scan  Completed   Hepatitis C Screening  Completed   Zoster (Shingles) Vaccine  Completed   Meningitis B Vaccine  Aged Out   Breast Cancer Screening  Discontinued   Colon Cancer Screening  Discontinued       02/04/2024   10:07 AM  Advanced Directives  Does Patient Have a Medical Advance Directive? Yes  Type of Estate Agent of [place];Living will  Copy of Healthcare Power of Attorney in Chart? No - copy requested    Vision: Annual vision screenings are recommended for early detection of glaucoma, cataracts, and diabetic retinopathy. These exams can also reveal signs of chronic conditions such as diabetes and high blood pressure.  Dental: Annual dental screenings help detect early signs of oral cancer, gum disease, and other conditions linked to overall  health, including heart disease and diabetes.  Please see the attached documents for additional preventive care recommendations.

## 2024-02-25 LAB — HM MAMMOGRAPHY

## 2024-03-11 ENCOUNTER — Telehealth: Payer: Self-pay

## 2024-03-11 NOTE — Telephone Encounter (Signed)
 Copied from CRM #[id number]. Topic: Clinical - Prescription Issue >> Mar 11, 2024 10:34 AM Chasity T wrote: Reason for CRM: patient is calling because she received a letter in the mail stating that her insurance will not be covering the cost for the medication telmisartan -hydrochlorothiazide  (MICARDIS  HCT) 40-12.5 MG tablet anymore and they are stating she will need to reach out to Dr Charlett to either get her to write a letter on why she needs this medication if she wants her to remain on it or choose a different one that her insurance will cover. I made an appointment for patient to go over this concern and she states that if Dr Charlett wants to do video visit instead that's fine. She was given a 30 pill by the insurance and currently has 20 left until the appointment. I also advised the patient to call her insurance to see If they could provide a list of the medication they will cover for blood pressure.  Please contact patient back if Dr Charlett is okay with seeing her through video. Patient is fine with either in office or video though.

## 2024-03-15 NOTE — Telephone Encounter (Signed)
 Follow up with pt. Inform pt of provider's message. Pt states that if Dr. Charlett would like to her to continue taking the medication she can write a letter-  Does she not want me to continue with same med? Inform pt, will relay her message to Dr. Charlett. Pt states she will call her insurance to get the list of hypertension med that are covered. Agreed with pt and inform her Dr. Charlett will discuss with her at her virtual appt.    Forwarding to Dr. Charlett

## 2024-03-16 ENCOUNTER — Telehealth: Payer: Self-pay | Admitting: Internal Medicine

## 2024-03-16 NOTE — Telephone Encounter (Signed)
 There may be options that will work as well in the class  please get  list of covered meds in this class  . Jon can you help?

## 2024-03-16 NOTE — Telephone Encounter (Signed)
 Request for Medicare Drug Coverage Determination form to be filled out--plaed in provider's folder.  Pt dropped off this form for just in case she needed it after her virtual visit on 03/22/24.

## 2024-03-17 NOTE — Telephone Encounter (Signed)
 Ill talk with her at upcoming visit and go from there . I could pick guess another in class and  hope goes through and works as well.

## 2024-03-17 NOTE — Telephone Encounter (Signed)
 Noted.

## 2024-03-17 NOTE — Telephone Encounter (Signed)
 Hello,  I ran test claims for all of the arb/diuretic combinations. They all come back showing covered at $0 copay, including her current medication that she is taking.  It would be helpful if she can bring the letter from her insurance company in to her appt so we can review in detail. Not sure why the letter would say it will not be covered but test claims are showing it would be no charge.   If it is truly not covered, it appears any of the arb/diuretic combos would be covered alternatives.

## 2024-03-22 ENCOUNTER — Encounter: Payer: Self-pay | Admitting: Internal Medicine

## 2024-03-22 ENCOUNTER — Telehealth: Admitting: Internal Medicine

## 2024-03-22 DIAGNOSIS — Z79899 Other long term (current) drug therapy: Secondary | ICD-10-CM

## 2024-03-22 DIAGNOSIS — I10 Essential (primary) hypertension: Secondary | ICD-10-CM

## 2024-03-22 MED ORDER — VALSARTAN-HYDROCHLOROTHIAZIDE 160-12.5 MG PO TABS: 1.0000 | ORAL_TABLET | Freq: Every day | ORAL | 1 refills | Status: AC

## 2024-03-22 NOTE — Progress Notes (Signed)
 " Virtual Visit via Video Note  I connected with Cheryl Robbins on 03/22/24 at  2:00 PM EST by a video enabled telemedicine application and verified that I am speaking with the correct person using two identifiers. Location patient: home Location provider:work office Persons participating in the virtual visit: patient, provider   Patient aware  of the limitations of evaluation and management by telemedicine and  availability of in person appointments. and agreed to proceed.   HPI: Cheryl Robbins presents for video visit   because  insurance was told ht med no longer covered but formulary but still doesn't have  a list.  Bp readings  122/75 range in day  but can be 144/80 average in am before med taken  NO se of med noted  ROS: See pertinent positives and negatives per HPI.  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   CATARACT EXTRACTION     Both eyes   COLONOSCOPY     EYE SURGERY     OOPHORECTOMY     tonsilletomy  1953    Family History  Problem Relation Age of Onset   Atrial fibrillation Mother        had cv in her 29 s   Arthritis Mother        had staph infection knee replacment   Cancer Sister        thryoid   Fibromyalgia Brother    Colon cancer Neg Hx    Rectal cancer Neg Hx    Stomach cancer Neg Hx     Social History[1]   Current Medications[2]  EXAM: BP Readings from Last 3 Encounters:  01/07/24 130/70  08/12/23 128/84  07/28/23 134/78    VITALS per patient if applicable:  GENERAL: alert, oriented, appears well and in no acute  distress  HEENT: atraumatic, conjunttiva clear, no obvious abnormalities on inspection of external nose and ears  NECK: normal movements of the head and neck  LUNGS: on inspection no signs of respiratory distress, breathing rate appears normal, no obvious gross SOB, gasping or wheezing  CV: no obvious cyanosis  MS: moves all visible extremities without noticeable abnormality  PSYCH/NEURO: pleasant and cooperative, no obvious depression or anxiety, speech and thought processing grossly intact Lab Results  Component Value Date   WBC 6.4 04/22/2023   HGB 15.1 (H) 04/22/2023   HCT 45.0 04/22/2023   PLT 186.0 04/22/2023   GLUCOSE 110 (H) 08/12/2023   CHOL 152 08/12/2023   TRIG 108.0 08/12/2023   HDL 40.60 08/12/2023   LDLDIRECT 171.0 06/17/2006   LDLCALC 90 08/12/2023   ALT 21 04/22/2023   AST 17 04/22/2023   NA 137 08/12/2023   K 4.3 08/12/2023   CL 99 08/12/2023   CREATININE 0.82 08/12/2023   BUN 13 08/12/2023   CO2 34 (H) 08/12/2023   TSH 2.04 04/22/2023   HGBA1C 5.4 04/22/2023    ASSESSMENT AND PLAN:  Discussed the following assessment and plan:  ICD-10-CM   1. Essential hypertension  I10     2. Medication management  Z79.899    forced to change because of insurance coverage     Bp had been controlled but  told that med no longer covered  in formulary   Hasnt received  yet the list of covered ( by mail) Record review  tolerated valsartan  hydrochlorothiazide  in past but went off cause of batch maufacturing  scarcity    Based on guessing in med ordering sytem  will order valsartan  160/12.5  and can begin with 1/2 per day  and inc to 1 per day depending on bp control ( it looks like the 80 12.5 not an option? )   Reassured that there should be arbdiurte in class covered but at this time we are guessing on formulary   and will adjust doing     I concerning fop control  we can try an override  as possible  Counseled.   Expectant management and discussion of plan  and treatment with opportunity to ask questions and all were answered. The patient agreed with the plan and demonstrated an understanding of the instructions.   Advised to call back or seek an in-person evaluation if worsening  or having  further concerns  in interim.  Also disc  audiology hearing check  consider [facility] since a member or other audiologist Return for when planned  and send in readings after switch .    Apolinar Eastern, MD      [1]  Social History Tobacco Use   Smoking status: Never   Smokeless tobacco: Never  Vaping Use   Vaping status: Never Used  Substance Use Topics   Alcohol use: No   Drug use: No  [2]  Current Outpatient Medications:    acetaminophen (TYLENOL) 500 MG tablet, Take 1,000 mg by mouth every 6 (six) hours as needed., Disp: , Rfl:    Ascorbic Acid (VITAMIN C) 1000 MG tablet, Take 1,000 mg by mouth daily., Disp: , Rfl:    atorvastatin  (LIPITOR) 10 MG tablet, TAKE 1 TABLET BY MOUTH EVERY DAY, Disp: 90 tablet, Rfl: 3   beta carotene w/minerals (OCUVITE) tablet, Take 1 tablet by mouth daily., Disp: , Rfl:    Calcium  Carbonate-Vitamin D  600-400 MG-UNIT tablet, Take 1 tablet by mouth daily., Disp: , Rfl:    co-enzyme Q-10 50 MG capsule, Take 150 mg by mouth daily., Disp: , Rfl:    ibuprofen (ADVIL,MOTRIN) 200 MG tablet, Take 200 mg by mouth every 6 (six) hours as needed., Disp: , Rfl:    levothyroxine  (SYNTHROID ) 25 MCG tablet, TAKE 1 TABLET BY MOUTH EVERY DAY BEFORE BREAKFAST, Disp: 90 tablet, Rfl: 3   loratadine (CLARITIN) 10 MG tablet, Take 10 mg by mouth as needed., Disp: , Rfl:    TART CHERRY PO, Take by mouth., Disp: , Rfl:    Turmeric 500 MG TABS, Take by mouth., Disp: , Rfl:    valsartan -hydrochlorothiazide  (DIOVAN -HCT) 160-12.5 MG tablet, Take 1 tablet by mouth daily., Disp: 90 tablet, Rfl: 1   VITAMIN D , CHOLECALCIFEROL, PO, Take by mouth., Disp: , Rfl:    EPINEPHrine 0.3 mg/0.3 mL IJ SOAJ injection, USE AS DIRECTED AS NEEDED, Disp: , Rfl:     fluticasone (FLONASE) 50 MCG/ACT nasal spray, Place into both nostrils daily., Disp: , Rfl:    guaiFENesin (MUCINEX) 600 MG 12 hr tablet, Take by mouth 2 (two) times daily., Disp: , Rfl:   "

## 2024-03-23 NOTE — Telephone Encounter (Signed)
 Had appt this week and will see if valsartan  hctx is covered

## 2024-04-26 ENCOUNTER — Encounter: Admitting: Internal Medicine

## 2024-05-03 ENCOUNTER — Encounter: Admitting: Internal Medicine

## 2024-07-06 ENCOUNTER — Ambulatory Visit: Admitting: Family Medicine
